# Patient Record
Sex: Female | Born: 1965 | Race: Black or African American | Hispanic: No | Marital: Single | Smoking: Current every day smoker
Health system: Southern US, Community
[De-identification: ages and names within clinical notes are randomized; demographics above are authoritative.]

## PROBLEM LIST (undated history)

## (undated) DIAGNOSIS — I10 Essential (primary) hypertension: Secondary | ICD-10-CM

## (undated) DIAGNOSIS — M542 Cervicalgia: Secondary | ICD-10-CM

## (undated) DIAGNOSIS — Z87448 Personal history of other diseases of urinary system: Secondary | ICD-10-CM

## (undated) DIAGNOSIS — R7301 Impaired fasting glucose: Secondary | ICD-10-CM

## (undated) DIAGNOSIS — Z8669 Personal history of other diseases of the nervous system and sense organs: Secondary | ICD-10-CM

## (undated) DIAGNOSIS — F17200 Nicotine dependence, unspecified, uncomplicated: Secondary | ICD-10-CM

## (undated) DIAGNOSIS — J40 Bronchitis, not specified as acute or chronic: Secondary | ICD-10-CM

## (undated) DIAGNOSIS — M549 Dorsalgia, unspecified: Secondary | ICD-10-CM

## (undated) DIAGNOSIS — E119 Type 2 diabetes mellitus without complications: Secondary | ICD-10-CM

## (undated) HISTORY — DX: Type 2 diabetes mellitus without complications: E11.9

## (undated) HISTORY — DX: Personal history of other diseases of urinary system: Z87.448

## (undated) HISTORY — DX: Dorsalgia, unspecified: M54.9

## (undated) HISTORY — DX: Impaired fasting glucose: R73.01

## (undated) HISTORY — DX: Personal history of other diseases of the nervous system and sense organs: Z86.69

## (undated) HISTORY — PX: CHOLECYSTECTOMY: SHX55

## (undated) HISTORY — PX: TUBAL LIGATION: SHX77

---

## 2000-04-15 ENCOUNTER — Emergency Department (HOSPITAL_COMMUNITY): Admission: EM | Admit: 2000-04-15 | Discharge: 2000-04-15 | Payer: Self-pay | Admitting: Emergency Medicine

## 2000-07-27 ENCOUNTER — Encounter: Payer: Self-pay | Admitting: Emergency Medicine

## 2000-07-27 ENCOUNTER — Emergency Department (HOSPITAL_COMMUNITY): Admission: EM | Admit: 2000-07-27 | Discharge: 2000-07-28 | Payer: Self-pay | Admitting: Emergency Medicine

## 2000-08-02 ENCOUNTER — Emergency Department (HOSPITAL_COMMUNITY): Admission: EM | Admit: 2000-08-02 | Discharge: 2000-08-02 | Payer: Self-pay | Admitting: Emergency Medicine

## 2000-08-18 HISTORY — PX: ABDOMINAL HYSTERECTOMY: SHX81

## 2001-05-26 ENCOUNTER — Other Ambulatory Visit: Admission: RE | Admit: 2001-05-26 | Discharge: 2001-05-26 | Payer: Self-pay | Admitting: *Deleted

## 2001-07-13 ENCOUNTER — Inpatient Hospital Stay (HOSPITAL_COMMUNITY): Admission: RE | Admit: 2001-07-13 | Discharge: 2001-07-15 | Payer: Self-pay | Admitting: *Deleted

## 2001-07-13 ENCOUNTER — Encounter (INDEPENDENT_AMBULATORY_CARE_PROVIDER_SITE_OTHER): Payer: Self-pay | Admitting: Specialist

## 2001-11-04 ENCOUNTER — Encounter: Payer: Self-pay | Admitting: Emergency Medicine

## 2001-11-04 ENCOUNTER — Emergency Department (HOSPITAL_COMMUNITY): Admission: EM | Admit: 2001-11-04 | Discharge: 2001-11-04 | Payer: Self-pay | Admitting: Emergency Medicine

## 2002-04-03 ENCOUNTER — Emergency Department (HOSPITAL_COMMUNITY): Admission: EM | Admit: 2002-04-03 | Discharge: 2002-04-04 | Payer: Self-pay | Admitting: Emergency Medicine

## 2002-04-04 ENCOUNTER — Encounter: Payer: Self-pay | Admitting: Emergency Medicine

## 2002-06-24 ENCOUNTER — Other Ambulatory Visit: Admission: RE | Admit: 2002-06-24 | Discharge: 2002-06-24 | Payer: Self-pay | Admitting: *Deleted

## 2003-01-25 ENCOUNTER — Encounter: Payer: Self-pay | Admitting: Emergency Medicine

## 2003-01-25 ENCOUNTER — Emergency Department (HOSPITAL_COMMUNITY): Admission: EM | Admit: 2003-01-25 | Discharge: 2003-01-25 | Payer: Self-pay | Admitting: Emergency Medicine

## 2003-08-02 ENCOUNTER — Emergency Department (HOSPITAL_COMMUNITY): Admission: EM | Admit: 2003-08-02 | Discharge: 2003-08-02 | Payer: Self-pay | Admitting: Emergency Medicine

## 2003-09-15 ENCOUNTER — Other Ambulatory Visit: Admission: RE | Admit: 2003-09-15 | Discharge: 2003-09-15 | Payer: Self-pay | Admitting: *Deleted

## 2003-10-04 ENCOUNTER — Emergency Department (HOSPITAL_COMMUNITY): Admission: EM | Admit: 2003-10-04 | Discharge: 2003-10-04 | Payer: Self-pay | Admitting: Family Medicine

## 2004-04-16 ENCOUNTER — Emergency Department (HOSPITAL_COMMUNITY): Admission: EM | Admit: 2004-04-16 | Discharge: 2004-04-16 | Payer: Self-pay | Admitting: Family Medicine

## 2004-05-22 ENCOUNTER — Emergency Department (HOSPITAL_COMMUNITY): Admission: EM | Admit: 2004-05-22 | Discharge: 2004-05-22 | Payer: Self-pay | Admitting: Emergency Medicine

## 2004-09-01 ENCOUNTER — Emergency Department (HOSPITAL_COMMUNITY): Admission: EM | Admit: 2004-09-01 | Discharge: 2004-09-01 | Payer: Self-pay | Admitting: Emergency Medicine

## 2004-11-10 ENCOUNTER — Emergency Department (HOSPITAL_COMMUNITY): Admission: EM | Admit: 2004-11-10 | Discharge: 2004-11-10 | Payer: Self-pay | Admitting: Emergency Medicine

## 2005-02-10 ENCOUNTER — Emergency Department (HOSPITAL_COMMUNITY): Admission: EM | Admit: 2005-02-10 | Discharge: 2005-02-10 | Payer: Self-pay | Admitting: Emergency Medicine

## 2005-02-11 ENCOUNTER — Encounter: Admission: RE | Admit: 2005-02-11 | Discharge: 2005-02-11 | Payer: Self-pay | Admitting: Family Medicine

## 2005-06-26 ENCOUNTER — Encounter: Admission: RE | Admit: 2005-06-26 | Discharge: 2005-06-26 | Payer: Self-pay | Admitting: Family Medicine

## 2005-08-02 ENCOUNTER — Emergency Department (HOSPITAL_COMMUNITY): Admission: EM | Admit: 2005-08-02 | Discharge: 2005-08-02 | Payer: Self-pay | Admitting: Emergency Medicine

## 2005-08-20 ENCOUNTER — Other Ambulatory Visit: Admission: RE | Admit: 2005-08-20 | Discharge: 2005-08-20 | Payer: Self-pay | Admitting: Obstetrics and Gynecology

## 2005-09-22 ENCOUNTER — Encounter (INDEPENDENT_AMBULATORY_CARE_PROVIDER_SITE_OTHER): Payer: Self-pay | Admitting: Specialist

## 2005-09-22 ENCOUNTER — Ambulatory Visit (HOSPITAL_COMMUNITY): Admission: RE | Admit: 2005-09-22 | Discharge: 2005-09-22 | Payer: Self-pay | Admitting: Obstetrics and Gynecology

## 2006-07-01 ENCOUNTER — Emergency Department (HOSPITAL_COMMUNITY): Admission: EM | Admit: 2006-07-01 | Discharge: 2006-07-01 | Payer: Self-pay | Admitting: Emergency Medicine

## 2006-07-24 ENCOUNTER — Ambulatory Visit: Payer: Self-pay | Admitting: Family Medicine

## 2006-08-27 ENCOUNTER — Emergency Department (HOSPITAL_COMMUNITY): Admission: EM | Admit: 2006-08-27 | Discharge: 2006-08-27 | Payer: Self-pay | Admitting: Emergency Medicine

## 2006-08-28 ENCOUNTER — Ambulatory Visit: Payer: Self-pay | Admitting: Family Medicine

## 2007-04-05 ENCOUNTER — Emergency Department (HOSPITAL_COMMUNITY): Admission: EM | Admit: 2007-04-05 | Discharge: 2007-04-05 | Payer: Self-pay | Admitting: Emergency Medicine

## 2007-08-23 ENCOUNTER — Emergency Department (HOSPITAL_COMMUNITY): Admission: EM | Admit: 2007-08-23 | Discharge: 2007-08-23 | Payer: Self-pay | Admitting: Emergency Medicine

## 2007-08-25 ENCOUNTER — Ambulatory Visit: Payer: Self-pay | Admitting: Family Medicine

## 2008-01-21 ENCOUNTER — Emergency Department (HOSPITAL_COMMUNITY): Admission: EM | Admit: 2008-01-21 | Discharge: 2008-01-22 | Payer: Self-pay | Admitting: Emergency Medicine

## 2008-01-26 ENCOUNTER — Ambulatory Visit: Payer: Self-pay | Admitting: Family Medicine

## 2008-03-13 ENCOUNTER — Ambulatory Visit: Payer: Self-pay | Admitting: Family Medicine

## 2008-03-15 ENCOUNTER — Encounter: Admission: RE | Admit: 2008-03-15 | Discharge: 2008-03-15 | Payer: Self-pay | Admitting: Family Medicine

## 2008-04-05 ENCOUNTER — Emergency Department (HOSPITAL_COMMUNITY): Admission: EM | Admit: 2008-04-05 | Discharge: 2008-04-05 | Payer: Self-pay | Admitting: Emergency Medicine

## 2008-04-07 ENCOUNTER — Ambulatory Visit: Payer: Self-pay | Admitting: Family Medicine

## 2008-09-08 ENCOUNTER — Ambulatory Visit: Payer: Self-pay | Admitting: Family Medicine

## 2008-10-02 ENCOUNTER — Ambulatory Visit: Payer: Self-pay | Admitting: Family Medicine

## 2008-10-02 ENCOUNTER — Other Ambulatory Visit: Admission: RE | Admit: 2008-10-02 | Discharge: 2008-10-02 | Payer: Self-pay | Admitting: Family Medicine

## 2008-11-05 ENCOUNTER — Emergency Department (HOSPITAL_COMMUNITY): Admission: EM | Admit: 2008-11-05 | Discharge: 2008-11-05 | Payer: Self-pay | Admitting: Emergency Medicine

## 2009-02-04 ENCOUNTER — Emergency Department (HOSPITAL_COMMUNITY): Admission: EM | Admit: 2009-02-04 | Discharge: 2009-02-04 | Payer: Self-pay | Admitting: Emergency Medicine

## 2009-02-09 ENCOUNTER — Ambulatory Visit: Payer: Self-pay | Admitting: Family Medicine

## 2009-05-14 ENCOUNTER — Encounter: Admission: RE | Admit: 2009-05-14 | Discharge: 2009-05-14 | Payer: Self-pay | Admitting: Family Medicine

## 2009-05-14 ENCOUNTER — Ambulatory Visit: Payer: Self-pay | Admitting: Family Medicine

## 2009-05-29 ENCOUNTER — Ambulatory Visit: Payer: Self-pay | Admitting: Family Medicine

## 2009-06-13 ENCOUNTER — Ambulatory Visit: Payer: Self-pay | Admitting: Family Medicine

## 2009-06-15 ENCOUNTER — Encounter: Admission: RE | Admit: 2009-06-15 | Discharge: 2009-06-15 | Payer: Self-pay | Admitting: Chiropractic Medicine

## 2009-08-21 ENCOUNTER — Ambulatory Visit: Payer: Self-pay | Admitting: Family Medicine

## 2009-09-04 ENCOUNTER — Ambulatory Visit: Payer: Self-pay | Admitting: Family Medicine

## 2010-01-04 ENCOUNTER — Ambulatory Visit (HOSPITAL_COMMUNITY): Admission: RE | Admit: 2010-01-04 | Discharge: 2010-01-04 | Payer: Self-pay | Admitting: Family Medicine

## 2010-04-08 ENCOUNTER — Ambulatory Visit: Payer: Self-pay | Admitting: Family Medicine

## 2010-04-09 ENCOUNTER — Ambulatory Visit: Payer: Self-pay | Admitting: Physician Assistant

## 2010-04-09 ENCOUNTER — Encounter: Admission: RE | Admit: 2010-04-09 | Discharge: 2010-04-09 | Payer: Self-pay | Admitting: Family Medicine

## 2010-06-18 ENCOUNTER — Ambulatory Visit: Payer: Self-pay | Admitting: Family Medicine

## 2010-06-19 ENCOUNTER — Ambulatory Visit: Payer: Self-pay | Admitting: Family Medicine

## 2010-09-27 ENCOUNTER — Ambulatory Visit (INDEPENDENT_AMBULATORY_CARE_PROVIDER_SITE_OTHER): Payer: BC Managed Care – PPO | Admitting: Family Medicine

## 2010-09-27 DIAGNOSIS — J029 Acute pharyngitis, unspecified: Secondary | ICD-10-CM

## 2010-09-27 DIAGNOSIS — H6690 Otitis media, unspecified, unspecified ear: Secondary | ICD-10-CM

## 2010-11-25 LAB — RAPID STREP SCREEN (MED CTR MEBANE ONLY): Streptococcus, Group A Screen (Direct): NEGATIVE

## 2011-01-03 NOTE — H&P (Signed)
Carol [name], TERRITO NO.:  [id number]   MEDICAL RECORD NO.:  [id number]          PATIENT TYPE:  AMB   LOCATION:  SDC                           FACILITY:  [facility]   PHYSICIAN:  Juluis Mire, M.D.   DATE OF BIRTH:  September 15, 1965   DATE OF ADMISSION:  09/22/2005  DATE OF DISCHARGE:                                HISTORY & PHYSICAL   The patient is a 45 year old gravida 3, para 2, aborta 1, black female  presents for diagnostic laparoscopy.   The patient had a previous total abdominal hysterectomy in 2002 for  management of uterine fibroids.  She has had persistent pelvic pain,  discomfort since that time.  She has had prior ultrasounds that have been  basically unremarkable.  She has had an increasing pain with intercourse,  particularly during deep penetration.  That pain tends to be concentrated  more on the right side.  Presumptively, we may be dealing with some pelvic  adhesions.  She now presents for laparoscopic evaluation.   In terms of allergies, she has no known drug allergies.   MEDICATIONS:  None.   PAST MEDICAL HISTORY:  Usual childhood diseases, no significant sequelae.   PAST OPERATIONS:  She has had a previous tubal.  She has had a D&E and she  has had two vaginal deliveries at term without difficulties.   REVIEW OF SYSTEMS:  Noncontributory.   SOCIAL HISTORY:  Smokes half a pack per day.  Occasional alcohol use.   FAMILY HISTORY:  Noncontributory.   PHYSICAL EXAMINATION:  VITAL SIGNS:  The patient is afebrile with stable  vital signs.  HEENT:  The patient is normocephalic.  Pupils are equal, round and react to  light and accommodation.  Extraocular movements were intact.  Sclerae and  conjunctivae are clear.  Oropharynx clear.  NECK:  Without thyromegaly.  BREASTS:  No discrete masses.  LUNGS:  Clear.  CARDIOVASCULAR:  Regular rhythm and rate without murmurs or gallops.  ABDOMEN:  Exam is benign.  No masses, organomegaly or tenderness.  PELVIC:  Normal external genitalia.  Vaginal mucosa is clear.  Cuff intact.  Does have cuff tenderness noted on exam.  No masses appreciated.  EXTREMITIES:  Trace edema.  NEUROLOGIC:  Exam was grossly within normal limits.   IMPRESSION:  1.  Chronic pelvic pain.  2.  Dyspareunia.  3.  Rule out pelvic adhesions.   PLAN:  The patient will undergo diagnostic laparoscopy with laser standby.  The risks of surgery have been discussed including the risk of infection,  the risk of hemorrhage that could require transfusion, the risk of AIDS or  hepatitis, the risk of injury to adjacent organs, requiring exploratory  surgery.  The risk of deep vein thrombosis and pulmonary embolus.  The  patient voices understanding of the indications and risks.      Juluis Mire, M.D.  Electronically Signed     JSM/MEDQ  D:  09/22/2005  T:  09/22/2005  Job:  [id number]

## 2011-01-03 NOTE — Op Note (Signed)
Carol, [name] NO.:  [id number]   MEDICAL RECORD NO.:  [id number]          PATIENT TYPE:  AMB   LOCATION:  SDC                           FACILITY:  [facility]   PHYSICIAN:  Juluis Mire, M.D.   DATE OF BIRTH:  05-Aug-1966   DATE OF PROCEDURE:  09/22/2005  DATE OF DISCHARGE:                                 OPERATIVE REPORT   PREOPERATIVE DIAGNOSIS:  Chronic pelvic pain.   POSTOPERATIVE DIAGNOSES:  1.  Pelvic adhesions.  2.  Functional left ovarian cyst.  3.  Endometriosis.   OPERATIVE PROCEDURES:  1.  Open laparoscopy.  2.  Lysis of adhesions.  3.  Cautery of endometriotic implants.   SURGEON:  Juluis Mire, M.D.   ANESTHESIA:  General endotracheal.   ESTIMATED BLOOD LOSS:  Minimal.   PACKS AND DRAINS:  None.   INTRAOPERATIVE BLOOD REPLACED:  None.   COMPLICATIONS:  None.   INDICATIONS:  Dictated in the history and physical.   PROCEDURE:  The patient was taken to the OR and placed in the supine  position.  After a satisfactory level of general anesthesia obtained, the  patient was placed in the dorsal lithotomy position using the Allen  stirrups.  The abdomen, perineum and vagina were prepped out with Betadine.  The bladder was emptied by in-and-out catheterization.  A sponge on a sponge  stick was placed in the vaginal vault.  The patient was draped in a sterile  field.  A subumbilical incision was made with a knife.  The incision was  extended to the fascia.  The fascia was extended sharply and the incision in  the fascia extended laterally.  The peritoneum was entered bluntly.  There  was no evidence of adhesions of the periumbilical area.  A Taut open  laparoscopic trocar was put in place and secured, the abdomen was inflated  of carbon dioxide and the laparoscope was introduced.  No evidence of injury  to adjacent organs.  The 5 mm trocar was put in place under direct  visualization.  The uterus and cervix were surgically absent.  The  right  ovary was high on the right pelvic sidewall and unremarkable.  The left  ovary had what appeared to be a functional ovarian cyst and had a few flimsy  adhesions to the left sigmoid colon.  The appendix was not seen.  It may  have been retrocecal or been previously removed.  Her upper abdomen was  unremarkable.  First the adhesions were taken down using sharp dissection.  We then did a left ovarian cystotomy and removed the cyst wall, and this was  sent for pathologic review.  Fluid was clear.  The inside of the cyst was  unremarkable.  It was felt to be functional in origin.  We used the bipolar  to bring about hemostasis.  We then thoroughly irrigated the pelvis, all  irrigation was removed, and there was no active bleeding or signs of  persistent adhesions.  She did have two implants of endometriosis at the  vaginal cuff, treated with cautery.  At  the end of the procedure we had  excellent hemostasis.  The abdomen was deflated of its carbon dioxide, all  trocars removed.  The subumbilical fascia was closed with two figure-of-  eights of 0 Vicryl, the skin with interrupted subcuticular of 4-0 Vicryl.  The suprapubic incision was closed with Dermabond.  The sponge on a sponge  stick was removed from the vaginal vault, the patient taken out of the  dorsal lithotomy position and once alert and extubated, transferred to the  recovery room in good condition.  Sponge, instrument and needle count was  reported as correct by the circulating nurse x2.      Juluis Mire, M.D.  Electronically Signed     JSM/MEDQ  D:  09/22/2005  T:  09/22/2005  Job:  [id number]

## 2011-01-03 NOTE — Discharge Summary (Signed)
[facility]  Patient:    TAISA, DELORIA Visit Number: [id number] MRN: [id number]          Service Type: GYN Location: 9300 [id number] Attending Physician:  Rhina Brackett Dictated by:   Duke Salvia. Marcelle Overlie, M.D. Admit Date:  07/13/2001 Discharge Date: 07/15/2001                             Discharge Summary  DISCHARGE DIAGNOSES:          1. Symptomatic leiomyoma with menorrhagia.                               2. Total abdominal hysterectomy this admission.  HISTORY OF PRESENT ILLNESS:   For summary of the history and physical exam, please see the admission H&P for details.  Briefly, a 45 year old, G3, P2, prior tubal ligation with symptomatic leiomyoma and menorrhagia presents for TAH.  HOSPITAL COURSE:              On July 13, 2001 under general anesthesia, the patient underwent TAH without complication.  On the first postoperative day, hemoglobin was 8.8.  Her diet was advanced. The IV was discontinued and she remained afebrile.  By the a.m. of July 15, 2001, she was tolerating a regular diet.  The incision was clean and dry.  Hemoglobin on postoperative day two was 7.9. Again, she remained afebrile and was ready for discharge at that point.  LABORATORY DATA:              Preoperative hemoglobin was 12.4.  On July 14, 2001, which was postoperative day one, it was 8.8.  On July 15, 2001, postoperative day two 7.9.  Admission urinalysis was unremarkable.  Blood type was A positive.  DISPOSITION:                  The patient was discharged home Hemocyte once daily or come to the office on Monday to have the clips removed.  Advised her to report any incisional redness or drainage, increased pain or bleeding, or fever over 101.  She was given specific instructions regarding diet, sex, exercise.  Tylox p.r.n. pain.  CONDITION ON DISCHARGE:       Good.  ACTIVITY:                     Graded increase. Dictated by:   Duke Salvia. Marcelle Overlie, M.D. Attending Physician:  Rhina Brackett DD:  07/15/01 TD:  07/15/01 Job: [id number]

## 2011-01-03 NOTE — Op Note (Signed)
[facility]  Patient:    Carol [name], Carol [name] Visit Number: [id number] MRN: [id number]          Service Type: GYN Location: 9300 [id number] Attending Physician:  Rhina Brackett Dictated by:   Duke Salvia. Marcelle Overlie, M.D. Proc. Date: 07/13/01 Admit Date:  07/13/2001                             Operative Report  PREOPERATIVE DIAGNOSIS:       Menorrhagia, leiomyoma.  POSTOPERATIVE DIAGNOSIS:      Menorrhagia, leiomyoma.  OPERATION:                    TAH.  SURGEON:                      Duke Salvia. Marcelle Overlie, M.D.  ASSISTANT:                    Raynald Kemp, M.D.  ANESTHESIA:                   General endotracheal.  COMPLICATIONS:                None.  DRAINS:                       Foley catheter.  ESTIMATED BLOOD LOSS:         100.  DESCRIPTION OF PROCEDURE AND FINDINGS:                 The patient was brought to the operating room and after an adequate level of general endotracheal anesthesia was obtained with the patient supine, the abdomen was prepped and draped in the usual manner for sterile abdominal procedure.  The vagina was prepped separately with Betadine and a Foley catheter was inserted.  A transverse incision was made two fingerbreadths above the symphysis and carried down to the fascia which was incised and extended transversely.  The rectus muscles were divided in the midline.  The peritoneum was entered superiorly without incident.  The upper abdomen was explored and noted to be unremarkable.  An OConnor-OSullivan retractor was position.  The patient was placed in Trendelenburg and the bowel packed superiorly out of the field.  Pelvic findings revealed the uterus was eight weeks size with irregular shape consistent with fibroids.  The adnexa are unremarkable.  The cul-de-sac was free and clear.  The cecum and appendix were otherwise unremarkable.  The utero-ovarian pedicles were grasped with long Kelly clamps starting on the left.   The round ligament was clamped, divided, and suture ligated with 0 Dexon.  The utero-ovarian pedicle on that side was divided, first free tied followed by a suture ligature of Dexon.  The peritoneum was carried around to the midline.  The exact same repeated on the opposite side.  After this was completed, the bladder was bluntly and sharply dissected below the cervicovaginal junction.  The uterine vascular pedicles were then skeletonized, clamped, divided, and suture ligated with 0 Dexon.  In a sequential manner, staying close to the uterus and ensuring that the bladder was well below.  The cardinal ligaments, uterosacral ligaments, and cervicovaginal pedicles were clamped, divided, and suture ligated with 0 Dexon suture, and the specimen was excised.  The cuff closed with a running locked 2-0 Dexon suture.  The pelvis was then irrigated with saline and noted  to be hemostatic.  The ovaries were suspended to the round ligament on either side. Reinspection revealed the operative site to be hemostatic.  Prior to closure, sponge, needle, and instrument counts were reported as correct x 2.  The rectus muscle was reapproximated in the midline with 2-0 Dexon interrupted sutures.  The fascia was closed from laterally to midline on either side with a 0 Dexon running suture.  The subcutaneous fat was hemostatic.  Clips and Steri-Strips were used on the skin.  She tolerated this well and went to the recovery room in good condition. Dictated by:   Duke Salvia. Marcelle Overlie, M.D. Attending Physician:  Rhina Brackett DD:  07/13/01 TD:  07/13/01 Job: [id number]

## 2011-01-03 NOTE — H&P (Signed)
[facility]  Patient:    Carol [name], Carol [name] Visit Number: [id number] MRN: [id number]          Service Type: Attending:  Duke Salvia. Marcelle Overlie, M.D. Dictated by:   Duke Salvia. Marcelle Overlie, M.D. Adm. Date:  07/13/01                           History and Physical  CHIEF COMPLAINT:              Pelvic pain, menorrhagia, leiomyoma.  HISTORY OF PRESENT ILLNESS:   A 46 year old G3 P2, previous tubal ligation. She was evaluated by Dr. Malachy Mood October 2002 on referral from Dr. Susann Givens for menometrorrhagia with pelvic pain.  Ultrasound per Dr. Susann Givens showed multiple uterine fibroids - 1.8 cm and another that was 1.5 cm.  Dr. Malachy Mood had discussed with her possible myomectomy but in his absence she had requested that I had performed her surgery.  In meeting with her preoperatively we discussed myomectomy at length.  We also reviewed hysterectomy at length.  The day that the myomectomy was scheduled, she did not want to change and thus we were unable to perform LAVH because the instrumentation was not available. After discussing this fact, she prefers to proceed with TAH, possible BSO. This was reviewed at length.  The procedure, risks relative to bleeding, infection, transfusion, adjacent organ injury all discussed.  Possibility of USO or BSO reviewed.  Other risks regarding wound infection, phlebitis, and her expected recovery time all reviewed, which she understands.  PAST MEDICAL HISTORY:  ALLERGIES:                    None.  OPERATIONS:                   Prior tubal and D&E.  OBSTETRICAL HISTORY:          Two vaginal deliveries at term without complication.  REVIEW OF SYSTEMS:            Otherwise negative.  SOCIAL HISTORY:               She smokes one-half PPD, occasional alcohol use.  PHYSICAL EXAMINATION:  VITAL SIGNS:                  Temperature 98.2, blood pressure 110/64.  HEENT:                        Unremarkable.  NECK:                          Supple without masses.  LUNGS:                        Clear.  CARDIOVASCULAR:               Regular rate and rhythm without murmurs, rubs, or gallops noted.  BREASTS:                      Without masses.  ABDOMEN:                      Soft, flat, nontender.  PELVIC:                       Normal external genitalia, vagina and cervix clear.  Uterus felt to be  8 week size.  Adnexa unremarkable.  IMPRESSION:                   Symptomatic leiomyoma, menometrorrhagia, with chronic pelvic pain and dysmenorrhea.  PLAN:                         TAH, possible BSO.  Procedure and risks discussed as above. Dictated by:   Duke Salvia. Marcelle Overlie, M.D. Attending:  Duke Salvia. Marcelle Overlie, M.D. DD:  07/12/01 TD:  07/12/01 Job: [id number]

## 2011-02-07 ENCOUNTER — Ambulatory Visit: Payer: BC Managed Care – PPO | Admitting: Medical

## 2011-05-07 LAB — URINALYSIS, ROUTINE W REFLEX MICROSCOPIC
Glucose, UA: NEGATIVE
Protein, ur: NEGATIVE
Specific Gravity, Urine: 1.018
pH: 5

## 2011-05-07 LAB — CBC
HCT: 37.4
Hemoglobin: 13
RBC: 4.17

## 2011-05-07 LAB — RPR: RPR Ser Ql: NONREACTIVE

## 2011-05-07 LAB — URINE MICROSCOPIC-ADD ON

## 2011-05-07 LAB — BASIC METABOLIC PANEL
CO2: 26
Calcium: 9.6
GFR calc Af Amer: >60
GFR calc non Af Amer: >60
Potassium: 3.4 — ABNORMAL LOW
Sodium: 141

## 2011-05-07 LAB — URINE CULTURE: Colony Count: 30000

## 2011-05-07 LAB — WET PREP, GENITAL: Trich, Wet Prep: NONE SEEN

## 2011-05-07 LAB — GC/CHLAMYDIA PROBE AMP, GENITAL: Chlamydia, DNA Probe: NEGATIVE

## 2011-05-08 ENCOUNTER — Encounter: Payer: Self-pay | Admitting: Family Medicine

## 2011-05-08 ENCOUNTER — Ambulatory Visit (INDEPENDENT_AMBULATORY_CARE_PROVIDER_SITE_OTHER): Payer: BC Managed Care – PPO | Admitting: Family Medicine

## 2011-05-08 VITALS — BP 140/90 | Temp 98.3°F | Wt 164.0 lb

## 2011-05-08 DIAGNOSIS — J029 Acute pharyngitis, unspecified: Secondary | ICD-10-CM

## 2011-05-08 NOTE — Progress Notes (Signed)
  Subjective:    Patient ID: Carol [name], female    DOB: 04/23/1966, 45 y.o.   MRN: [id number]  HPI She complains of a one-day history of slight sore throat, myalgias, some nausea, chills with tinnitus. No earache or congestion.   Review of Systems     Objective:   Physical Exam alert and in no distress. Tympanic membranes and canals are normal. Throat is clear. Tonsils are normal. Neck is supple without adenopathy or thyromegaly. Cardiac exam shows a regular sinus rhythm without murmurs or gallops. Lungs are clear to auscultation. Strep screen negative       Assessment & Plan:  Viral URI/pharyngitis Supportive care. Call if further difficulties.

## 2011-05-09 ENCOUNTER — Emergency Department (HOSPITAL_COMMUNITY)
Admission: EM | Admit: 2011-05-09 | Discharge: 2011-05-09 | Disposition: A | Discharge status: Home / Self Care | Payer: BC Managed Care – PPO | Attending: Emergency Medicine | Admitting: Emergency Medicine

## 2011-05-09 DIAGNOSIS — R05 Cough: Secondary | ICD-10-CM | POA: Insufficient documentation

## 2011-05-09 DIAGNOSIS — R5383 Other fatigue: Secondary | ICD-10-CM | POA: Insufficient documentation

## 2011-05-09 DIAGNOSIS — I10 Essential (primary) hypertension: Secondary | ICD-10-CM | POA: Insufficient documentation

## 2011-05-09 DIAGNOSIS — R5381 Other malaise: Secondary | ICD-10-CM | POA: Insufficient documentation

## 2011-05-09 DIAGNOSIS — J069 Acute upper respiratory infection, unspecified: Secondary | ICD-10-CM | POA: Insufficient documentation

## 2011-05-09 DIAGNOSIS — R059 Cough, unspecified: Secondary | ICD-10-CM | POA: Insufficient documentation

## 2011-05-12 ENCOUNTER — Emergency Department (HOSPITAL_COMMUNITY): Payer: BC Managed Care – PPO

## 2011-05-12 ENCOUNTER — Emergency Department (HOSPITAL_COMMUNITY)
Admission: EM | Admit: 2011-05-12 | Discharge: 2011-05-13 | Disposition: A | Discharge status: Home / Self Care | Payer: BC Managed Care – PPO | Attending: Emergency Medicine | Admitting: Emergency Medicine

## 2011-05-12 DIAGNOSIS — J069 Acute upper respiratory infection, unspecified: Secondary | ICD-10-CM | POA: Insufficient documentation

## 2011-05-12 DIAGNOSIS — R079 Chest pain, unspecified: Secondary | ICD-10-CM | POA: Insufficient documentation

## 2011-05-12 DIAGNOSIS — R0682 Tachypnea, not elsewhere classified: Secondary | ICD-10-CM | POA: Insufficient documentation

## 2011-05-12 DIAGNOSIS — R509 Fever, unspecified: Secondary | ICD-10-CM | POA: Insufficient documentation

## 2011-05-12 DIAGNOSIS — I10 Essential (primary) hypertension: Secondary | ICD-10-CM | POA: Insufficient documentation

## 2011-05-12 DIAGNOSIS — IMO0001 Reserved for inherently not codable concepts without codable children: Secondary | ICD-10-CM | POA: Insufficient documentation

## 2011-05-14 ENCOUNTER — Encounter: Payer: Self-pay | Admitting: Family Medicine

## 2011-05-19 ENCOUNTER — Ambulatory Visit: Payer: BC Managed Care – PPO | Admitting: Family Medicine

## 2011-05-30 LAB — URINALYSIS, ROUTINE W REFLEX MICROSCOPIC
Bilirubin Urine: NEGATIVE
Ketones, ur: NEGATIVE
Nitrite: NEGATIVE
Protein, ur: NEGATIVE
pH: 6

## 2011-05-30 LAB — HEPATIC FUNCTION PANEL
ALT: 19
AST: 18
Albumin: 3.7
Alkaline Phosphatase: 61
Bilirubin, Direct: 0.1
Indirect Bilirubin: 0.4
Total Bilirubin: 0.5
Total Protein: 7

## 2011-05-30 LAB — LIPASE, BLOOD: Lipase: 33

## 2011-05-30 LAB — DIFFERENTIAL
Basophils Absolute: 0
Basophils Relative: 0
Eosinophils Absolute: 0.2
Eosinophils Relative: 3
Lymphocytes Relative: 31
Monocytes Absolute: 0.3

## 2011-05-30 LAB — URINE MICROSCOPIC-ADD ON

## 2011-05-30 LAB — CBC
HCT: 36.7
Hemoglobin: 12.6
MCHC: 34.4
MCV: 89.3
RDW: 12.6

## 2011-05-30 LAB — BASIC METABOLIC PANEL
BUN: 9
CO2: 25
Chloride: 108
Glucose, Bld: 96
Potassium: 3.4 — ABNORMAL LOW
Sodium: 140

## 2011-11-03 ENCOUNTER — Emergency Department (HOSPITAL_COMMUNITY)
Admission: EM | Admit: 2011-11-03 | Discharge: 2011-11-03 | Disposition: A | Discharge status: Home / Self Care | Payer: BC Managed Care – PPO | Attending: Emergency Medicine | Admitting: Emergency Medicine

## 2011-11-03 ENCOUNTER — Encounter (HOSPITAL_COMMUNITY): Payer: Self-pay | Admitting: *Deleted

## 2011-11-03 ENCOUNTER — Emergency Department (HOSPITAL_COMMUNITY): Payer: BC Managed Care – PPO

## 2011-11-03 DIAGNOSIS — N760 Acute vaginitis: Secondary | ICD-10-CM | POA: Insufficient documentation

## 2011-11-03 DIAGNOSIS — Z90710 Acquired absence of both cervix and uterus: Secondary | ICD-10-CM | POA: Insufficient documentation

## 2011-11-03 DIAGNOSIS — R300 Dysuria: Secondary | ICD-10-CM | POA: Insufficient documentation

## 2011-11-03 DIAGNOSIS — A499 Bacterial infection, unspecified: Secondary | ICD-10-CM | POA: Insufficient documentation

## 2011-11-03 DIAGNOSIS — R1031 Right lower quadrant pain: Secondary | ICD-10-CM | POA: Insufficient documentation

## 2011-11-03 DIAGNOSIS — B9689 Other specified bacterial agents as the cause of diseases classified elsewhere: Secondary | ICD-10-CM | POA: Insufficient documentation

## 2011-11-03 LAB — URINALYSIS, ROUTINE W REFLEX MICROSCOPIC
Glucose, UA: NEGATIVE mg/dL
Leukocytes, UA: NEGATIVE
pH: 5.5 (ref 5.0–8.0)

## 2011-11-03 LAB — DIFFERENTIAL
Eosinophils Relative: 1 % (ref 0–5)
Lymphocytes Relative: 36 % (ref 12–46)
Lymphs Abs: 3.1 10*3/uL (ref 0.7–4.0)
Monocytes Absolute: 0.7 10*3/uL (ref 0.1–1.0)
Monocytes Relative: 8 % (ref 3–12)

## 2011-11-03 LAB — CBC
HCT: 35.2 % — ABNORMAL LOW (ref 36.0–46.0)
Hemoglobin: 12.5 g/dL (ref 12.0–15.0)
MCV: 83.4 fL (ref 78.0–100.0)
Platelets: 387 10*3/uL (ref 150–400)
RBC: 4.22 MIL/uL (ref 3.87–5.11)
WBC: 8.6 10*3/uL (ref 4.0–10.5)

## 2011-11-03 LAB — POCT I-STAT, CHEM 8
Calcium, Ion: 1.17 mmol/L (ref 1.12–1.32)
HCT: 37 % (ref 36.0–46.0)
TCO2: 25 mmol/L (ref 0–100)

## 2011-11-03 LAB — URINE MICROSCOPIC-ADD ON

## 2011-11-03 LAB — WET PREP, GENITAL: Yeast Wet Prep HPF POC: NONE SEEN

## 2011-11-03 MED ORDER — MORPHINE SULFATE 4 MG/ML IJ SOLN
4.0000 mg | Freq: Once | INTRAMUSCULAR | Status: AC
  Administered 2011-11-03: 4 mg via INTRAVENOUS
  Filled 2011-11-03: qty 1

## 2011-11-03 MED ORDER — HYDROCODONE-ACETAMINOPHEN 5-500 MG PO TABS: 1.0000 | ORAL_TABLET | Freq: Four times a day (QID) | ORAL | Status: DC | PRN

## 2011-11-03 MED ORDER — METRONIDAZOLE 500 MG PO TABS: 500.0000 mg | ORAL_TABLET | Freq: Two times a day (BID) | ORAL | Status: AC

## 2011-11-03 MED ORDER — IOHEXOL 300 MG/ML  SOLN
100.0000 mL | Freq: Once | INTRAMUSCULAR | Status: AC | PRN
  Administered 2011-11-03: 100 mL via INTRAVENOUS

## 2011-11-03 NOTE — Discharge Instructions (Signed)
Bacterial Vaginosis Bacterial vaginosis (BV) is a vaginal infection where the normal balance of bacteria in the vagina is disrupted. The normal balance is then replaced by an overgrowth of certain bacteria. There are several different kinds of bacteria that can cause BV. BV is the most common vaginal infection in women of childbearing age. CAUSES   The cause of BV is not fully understood. BV develops when there is an increase or imbalance of harmful bacteria.   Some activities or behaviors can upset the normal balance of bacteria in the vagina and put women at increased risk including:   Having a new sex partner or multiple sex partners.   Douching.   Using an intrauterine device (IUD) for contraception.   It is not clear what role sexual activity plays in the development of BV. However, women that have never had sexual intercourse are rarely infected with BV.  Women do not get BV from toilet seats, bedding, swimming pools or from touching objects around them.  SYMPTOMS   Grey vaginal discharge.   A fish-like odor with discharge, especially after sexual intercourse.   Itching or burning of the vagina and vulva.   Burning or pain with urination.   Some women have no signs or symptoms at all.  DIAGNOSIS  Your caregiver must examine the vagina for signs of BV. Your caregiver will perform lab tests and look at the sample of vaginal fluid through a microscope. They will look for bacteria and abnormal cells (clue cells), a pH test higher than 4.5, and a positive amine test all associated with BV.  RISKS AND COMPLICATIONS   Pelvic inflammatory disease (PID).   Infections following gynecology surgery.   Developing HIV.   Developing herpes virus.  TREATMENT  Sometimes BV will clear up without treatment. However, all women with symptoms of BV should be treated to avoid complications, especially if gynecology surgery is planned. Female partners generally do not need to be treated. However,  BV may spread between female sex partners so treatment is helpful in preventing a recurrence of BV.   BV may be treated with antibiotics. The antibiotics come in either pill or vaginal cream forms. Either can be used with nonpregnant or pregnant women, but the recommended dosages differ. These antibiotics are not harmful to the baby.   BV can recur after treatment. If this happens, a second round of antibiotics will often be prescribed.   Treatment is important for pregnant women. If not treated, BV can cause a premature delivery, especially for a pregnant woman who had a premature birth in the past. All pregnant women who have symptoms of BV should be checked and treated.   For chronic reoccurrence of BV, treatment with a type of prescribed gel vaginally twice a week is helpful.  HOME CARE INSTRUCTIONS   Finish all medication as directed by your caregiver.   Do not have sex until treatment is completed.   Tell your sexual partner that you have a vaginal infection. They should see their caregiver and be treated if they have problems, such as a mild rash or itching.   Practice safe sex. Use condoms. Only have 1 sex partner.  PREVENTION  Basic prevention steps can help reduce the risk of upsetting the natural balance of bacteria in the vagina and developing BV:  Do not have sexual intercourse (be abstinent).   Do not douche.   Use all of the medicine prescribed for treatment of BV, even if the signs and symptoms go away.     Tell your sex partner if you have BV. That way, they can be treated, if needed, to prevent reoccurrence.  SEEK MEDICAL CARE IF:   Your symptoms are not improving after 3 days of treatment.   You have increased discharge, pain, or fever.  MAKE SURE YOU:   Understand these instructions.   Will watch your condition.   Will get help right away if you are not doing well or get worse.  FOR MORE INFORMATION  Division of STD Prevention (DSTDP), Centers for Disease  Control and Prevention: www.cdc.gov/std American Social Health Association (ASHA): www.ashastd.org  Document Released: 08/04/2005 Document Revised: 07/24/2011 Document Reviewed: 01/25/2009 ExitCare Patient Information 2012 [employer].Abdominal Pain Abdominal pain can be caused by many things. Your caregiver decides the seriousness of your pain by an examination and possibly blood tests and X-rays. Many cases can be observed and treated at home. Most abdominal pain is not caused by a disease and will probably improve without treatment. However, in many cases, more time must pass before a clear cause of the pain can be found. Before that point, it may not be known if you need more testing, or if hospitalization or surgery is needed. HOME CARE INSTRUCTIONS   Do not take laxatives unless directed by your caregiver.   Take pain medicine only as directed by your caregiver.   Only take over-the-counter or prescription medicines for pain, discomfort, or fever as directed by your caregiver.   Try a clear liquid diet (broth, tea, or water) for as long as directed by your caregiver. Slowly move to a bland diet as tolerated.  SEEK IMMEDIATE MEDICAL CARE IF:   The pain does not go away.   You have a fever.   You keep throwing up (vomiting).   The pain is felt only in portions of the abdomen. Pain in the right side could possibly be appendicitis. In an adult, pain in the left lower portion of the abdomen could be colitis or diverticulitis.   You pass bloody or black tarry stools.  MAKE SURE YOU:   Understand these instructions.   Will watch your condition.   Will get help right away if you are not doing well or get worse.  Document Released: 05/14/2005 Document Revised: 07/24/2011 Document Reviewed: 03/22/2008 Exit[employer] Patient Information 2012 [employer]. 

## 2011-11-03 NOTE — ED Notes (Signed)
Pt c/o abdominal pain that began early this morning while at work. Pt describes pain as sharp and constant.  Pt states that last week she was sick with nausea and diarrhea but denies vomiting. Pt also reports increased frequency in urination as well as painful urination.

## 2011-11-03 NOTE — ED Notes (Signed)
To US via w/c

## 2011-11-03 NOTE — ED Provider Notes (Signed)
History     CSN: [id number]  Arrival date & time 11/03/11  0865   First MD Initiated Contact with Patient 11/03/11 0559      Chief Complaint  Patient presents with  . Abdominal Pain  . Dysuria    (Consider location/radiation/quality/duration/timing/severity/associated sxs/prior treatment) HPI  46 year old female with history of tubal ligation presents with abdominal pain. Patient states 2 days ago and she experiencing a gradual onset of right lower quadrant abdominal pain. Describe pain as a sharp sensation, that is waxing and waning, usually lasting for seconds. Pain worsened with palpation. She has not tried anything to relieve the pain. She also noticed some dysuria, and urinary frequency. She denies vaginal discharge. Patient denies fever, nausea, vomiting, diarrhea, constipation, chest pain, shortness of breath. She does experience low back pain. She denies rash. She denies any strenuous exercise, or any recent trauma. She is able to pass flatus. Aside from a tubal ligation procedure she has not had any other abdominal surgery. She states she is not sexually active.  Past Medical History  Diagnosis Date  . History of migraine headaches   . Impaired fasting glucose     Past Surgical History  Procedure Date  . Cholecystectomy   . Abdominal hysterectomy 2002    Family History  Problem Relation Age of Onset  . Arthritis Paternal Grandmother   . Diabetes Paternal Grandmother   . Hypertension Paternal Grandmother   . Stroke Paternal Grandmother     History  Substance Use Topics  . Smoking status: Current Some Day Smoker  . Smokeless tobacco: Never Used  . Alcohol Use: Not on file    OB History    Grav Para Term Preterm Abortions TAB SAB Ect Mult Living                  Review of Systems  All other systems reviewed and are negative.    Allergies  Review of patient's allergies indicates no known allergies.  Home Medications   Current Outpatient Rx  Name  Route Sig Dispense Refill  . TELMISARTAN-HCTZ 40-12.5 MG PO TABS Oral Take 1 tablet by mouth daily.        BP 144/81  Pulse 98  Temp(Src) 98.3 F (36.8 C) (Oral)  Resp 18  SpO2 98%  Physical Exam  Nursing note and vitals reviewed. Constitutional: She appears well-developed and well-nourished. No distress.  HENT:  Head: Normocephalic and atraumatic.  Eyes: Conjunctivae are normal.  Neck: Normal range of motion. Neck supple.  Cardiovascular: Normal rate and regular rhythm.   Pulmonary/Chest: Effort normal and breath sounds normal. She exhibits no tenderness.  Abdominal: Soft. There is tenderness in the right lower quadrant. There is no rigidity, no rebound, no guarding, no CVA tenderness, no tenderness at McBurney's point and negative Murphy's sign. No hernia. Hernia confirmed negative in the ventral area, confirmed negative in the right inguinal area and confirmed negative in the left inguinal area.  Genitourinary: Vagina normal and uterus normal. There is no rash or lesion on the right labia. There is no rash or lesion on the left labia. Cervix exhibits no motion tenderness and no discharge. Right adnexum displays tenderness. Right adnexum displays no mass. Left adnexum displays no mass and no tenderness. No erythema, tenderness or bleeding around the vagina. No vaginal discharge found.  Lymphadenopathy:       Right: No inguinal adenopathy present.       Left: No inguinal adenopathy present.  Skin:  ED Course  Procedures (including critical care time)   Labs Reviewed  URINALYSIS, ROUTINE W REFLEX MICROSCOPIC   No results found.   No diagnosis found.  Results for orders placed during the hospital encounter of 11/03/11  URINALYSIS, ROUTINE W REFLEX MICROSCOPIC      Component Value Range   Color, Urine YELLOW  YELLOW    APPearance CLOUDY (*) CLEAR    Specific Gravity, Urine 1.019  1.005 - 1.030    pH 5.5  5.0 - 8.0    Glucose, UA NEGATIVE  NEGATIVE (mg/dL)   Hgb  urine dipstick LARGE (*) NEGATIVE    Bilirubin Urine NEGATIVE  NEGATIVE    Ketones, ur NEGATIVE  NEGATIVE (mg/dL)   Protein, ur NEGATIVE  NEGATIVE (mg/dL)   Urobilinogen, UA 0.2  0.0 - 1.0 (mg/dL)   Nitrite NEGATIVE  NEGATIVE    Leukocytes, UA NEGATIVE  NEGATIVE   URINE MICROSCOPIC-ADD ON      Component Value Range   Squamous Epithelial / LPF MANY (*) RARE    RBC / HPF 3-6  <3 (RBC/hpf)   Bacteria, UA FEW (*) RARE    Urine-Other MUCOUS PRESENT    POCT I-STAT, CHEM 8      Component Value Range   Sodium 139  135 - 145 (mEq/L)   Potassium 3.5  3.5 - 5.1 (mEq/L)   Chloride 102  96 - 112 (mEq/L)   BUN 8  6 - 23 (mg/dL)   Creatinine, Ser 1.30  0.50 - 1.10 (mg/dL)   Glucose, Bld 865 (*) 70 - 99 (mg/dL)   Calcium, Ion 7.84  6.96 - 1.32 (mmol/L)   TCO2 25  0 - 100 (mmol/L)   Hemoglobin 12.6  12.0 - 15.0 (g/dL)   HCT 29.5  28.4 - 13.2 (%)  CBC      Component Value Range   WBC 8.6  4.0 - 10.5 (K/uL)   RBC 4.22  3.87 - 5.11 (MIL/uL)   Hemoglobin 12.5  12.0 - 15.0 (g/dL)   HCT 44.0 (*) 10.2 - 46.0 (%)   MCV 83.4  78.0 - 100.0 (fL)   MCH 29.6  26.0 - 34.0 (pg)   MCHC 35.5  30.0 - 36.0 (g/dL)   RDW 72.5  36.6 - 44.0 (%)   Platelets 387  150 - 400 (K/uL)  DIFFERENTIAL      Component Value Range   Neutrophils Relative 54  43 - 77 (%)   Neutro Abs 4.7  1.7 - 7.7 (K/uL)   Lymphocytes Relative 36  12 - 46 (%)   Lymphs Abs 3.1  0.7 - 4.0 (K/uL)   Monocytes Relative 8  3 - 12 (%)   Monocytes Absolute 0.7  0.1 - 1.0 (K/uL)   Eosinophils Relative 1  0 - 5 (%)   Eosinophils Absolute 0.1  0.0 - 0.7 (K/uL)   Basophils Relative 0  0 - 1 (%)   Basophils Absolute 0.0  0.0 - 0.1 (K/uL)  WET PREP, GENITAL      Component Value Range   Yeast Wet Prep HPF POC NONE SEEN  NONE SEEN    Trich, Wet Prep NONE SEEN  NONE SEEN    Clue Cells Wet Prep HPF POC MODERATE (*) NONE SEEN    WBC, Wet Prep HPF POC FEW (*) NONE SEEN    US Transvaginal Non-ob  11/03/2011  *RADIOLOGY REPORT*  Clinical Data: Post  hysterectomy and tubal ligation.  Right lower abdominal pain.  TRANSABDOMINAL ULTRASOUND OF PELVIS  Technique:  Transabdominal ultrasound examination of the pelvis was performed including evaluation of the uterus, ovaries, adnexal regions, and pelvic cul-de-sac.  Comparison:  04/05/2007 CT.  Findings:  Uterus:  Surgically removed.  Vaginal cuff unremarkable.  Right ovary: 2.7 x 1.8 x 2.1 cm.  No dominant mass.  Left ovary: Not visualized.  Other Findings:  No free fluid  IMPRESSION: Post hysterectomy.  Left ovary not visualized.  Right ovary unremarkable.  Original Report Authenticated By: Fuller Canada, M.D.   US Pelvis Complete  11/03/2011  *RADIOLOGY REPORT*  Clinical Data: Post hysterectomy and tubal ligation.  Right lower abdominal pain.  TRANSABDOMINAL ULTRASOUND OF PELVIS  Technique:  Transabdominal ultrasound examination of the pelvis was performed including evaluation of the uterus, ovaries, adnexal regions, and pelvic cul-de-sac.  Comparison:  04/05/2007 CT.  Findings:  Uterus:  Surgically removed.  Vaginal cuff unremarkable.  Right ovary: 2.7 x 1.8 x 2.1 cm.  No dominant mass.  Left ovary: Not visualized.  Other Findings:  No free fluid  IMPRESSION: Post hysterectomy.  Left ovary not visualized.  Right ovary unremarkable.  Original Report Authenticated By: Fuller Canada, M.D.   Ct Abdomen Pelvis W Contrast  11/03/2011  **ADDENDUM** CREATED: 11/03/2011 11:25:55  Contrast:  100 ml Omnipaque-300.  **END ADDENDUM** SIGNED BY: Almedia Balls. Constance Goltz, M.D.    11/03/2011  *RADIOLOGY REPORT*  Clinical Data: Right lower quadrant pain.  CT ABDOMEN AND PELVIS WITH CONTRAST  Technique:  Multidetector CT imaging of the abdomen and pelvis was performed following the standard protocol during bolus administration of intravenous contrast.  Contrast:  Comparison: 03/08 pain 05/07/2012 pelvic sonogram.  05/14/2009 CT.  Findings: No extraluminal bowel inflammatory process, free fluid or free air.  Specifically, no  inflammation surrounds the superiorly directed appendix.  Suggestion of mild fatty infiltration of the liver.  Minimally lobulated contour without other findings of cirrhosis.  Post cholecystectomy.  No focal hepatic, splenic, pancreatic, adrenal or renal mass.  No abdominal aortic aneurysm.  Partial calcification lower abdominal aorta.  Post hysterectomy.  Decompressed urinary bladder.  Degenerative changes lower lumbar spine.  No bony destructive lesion.  No pelvic, retroperitoneal or mesenteric adenopathy.  Visualized lung bases clear.  IMPRESSION: No inflammatory process noted.  Please see above.  Original Report Authenticated By: Fuller Canada, M.D.      MDM  Right lower quadrant abdominal pain. Complaints of dysuria with no evidence of urinary tract infection on UA. We'll obtain labs. We'll perform pelvic exam.  No pelvic motion tenderness.  RLQ tender on palpation.  Normal WBC.  Will obtain Pelvic US for further evaluation.  Low suspicion for appendicitis. Low suspicion for kidney stone.  Pt does have evidence of large Hgb on urine dipstick, but it is her baseline when compared to prior UA result.    8:00 AM Wet prep shows evidence of BV.  Pt does complain of mild dysurea. Will treat for BV with Flagyl.    8:37 AM Pelvis US and Transvaginal US shows no acute finding.  Pt continues to endorse RLQ pain.      1:56 PM Abd CT scan shows no acute finding.  Reassurance given.  Will discharge with f/u instruction.    Fayrene Helper, PA-C 11/03/11 1357

## 2011-11-03 NOTE — ED Notes (Signed)
Patient transported to CT 

## 2011-11-03 NOTE — ED Notes (Signed)
Pt c/o suprapubic pain and occasional dysuria.

## 2011-11-04 LAB — GC/CHLAMYDIA PROBE AMP, GENITAL
Chlamydia, DNA Probe: NEGATIVE
GC Probe Amp, Genital: NEGATIVE

## 2011-11-07 NOTE — ED Provider Notes (Signed)
Medical screening examination/treatment/procedure(s) were performed by non-physician practitioner and as supervising physician I was immediately available for consultation/collaboration.  Korbyn Vanes, MD 11/07/11 1928 

## 2012-06-04 ENCOUNTER — Emergency Department (HOSPITAL_COMMUNITY): Payer: BC Managed Care – PPO

## 2012-06-04 ENCOUNTER — Other Ambulatory Visit: Payer: Self-pay | Admitting: Family Medicine

## 2012-06-04 ENCOUNTER — Encounter (HOSPITAL_COMMUNITY): Payer: Self-pay

## 2012-06-04 ENCOUNTER — Emergency Department (HOSPITAL_COMMUNITY)
Admission: EM | Admit: 2012-06-04 | Discharge: 2012-06-04 | Disposition: A | Discharge status: Home / Self Care | Payer: BC Managed Care – PPO | Attending: Emergency Medicine | Admitting: Emergency Medicine

## 2012-06-04 DIAGNOSIS — R12 Heartburn: Secondary | ICD-10-CM | POA: Insufficient documentation

## 2012-06-04 DIAGNOSIS — R1013 Epigastric pain: Secondary | ICD-10-CM | POA: Insufficient documentation

## 2012-06-04 DIAGNOSIS — I10 Essential (primary) hypertension: Secondary | ICD-10-CM | POA: Insufficient documentation

## 2012-06-04 DIAGNOSIS — R109 Unspecified abdominal pain: Secondary | ICD-10-CM

## 2012-06-04 HISTORY — DX: Essential (primary) hypertension: I10

## 2012-06-04 HISTORY — DX: Bronchitis, not specified as acute or chronic: J40

## 2012-06-04 LAB — URINE MICROSCOPIC-ADD ON

## 2012-06-04 LAB — URINALYSIS, ROUTINE W REFLEX MICROSCOPIC
Leukocytes, UA: NEGATIVE
Nitrite: NEGATIVE
Specific Gravity, Urine: 1.015 (ref 1.005–1.030)
pH: 6 (ref 5.0–8.0)

## 2012-06-04 LAB — LIPASE, BLOOD: Lipase: 35 U/L (ref 11–59)

## 2012-06-04 LAB — COMPREHENSIVE METABOLIC PANEL
Albumin: 3.9 g/dL (ref 3.5–5.2)
Alkaline Phosphatase: 93 U/L (ref 39–117)
BUN: 8 mg/dL (ref 6–23)
Potassium: 3.3 mEq/L — ABNORMAL LOW (ref 3.5–5.1)
Total Protein: 7.9 g/dL (ref 6.0–8.3)

## 2012-06-04 LAB — CBC WITH DIFFERENTIAL/PLATELET
Hemoglobin: 13.3 g/dL (ref 12.0–15.0)
Lymphs Abs: 3 10*3/uL (ref 0.7–4.0)
Monocytes Relative: 7 % (ref 3–12)
Neutro Abs: 5.4 10*3/uL (ref 1.7–7.7)
Neutrophils Relative %: 59 % (ref 43–77)
RBC: 4.46 MIL/uL (ref 3.87–5.11)

## 2012-06-04 MED ORDER — SODIUM CHLORIDE 0.9 % IV SOLN
1000.0000 mL | Freq: Once | INTRAVENOUS | Status: AC
  Administered 2012-06-04: 1000 mL via INTRAVENOUS

## 2012-06-04 MED ORDER — HYDROCODONE-ACETAMINOPHEN 5-325 MG PO TABS: 1.0000 | ORAL_TABLET | Freq: Four times a day (QID) | ORAL | Status: DC | PRN

## 2012-06-04 MED ORDER — PANTOPRAZOLE SODIUM 20 MG PO TBEC: 40.0000 mg | DELAYED_RELEASE_TABLET | Freq: Every day | ORAL | Status: DC

## 2012-06-04 MED ORDER — ONDANSETRON HCL 4 MG/2ML IJ SOLN
4.0000 mg | Freq: Once | INTRAMUSCULAR | Status: AC
  Administered 2012-06-04: 4 mg via INTRAVENOUS

## 2012-06-04 MED ORDER — ONDANSETRON 8 MG PO TBDP: 8.0000 mg | ORAL_TABLET | Freq: Three times a day (TID) | ORAL | Status: DC | PRN

## 2012-06-04 MED ORDER — SODIUM CHLORIDE 0.9 % IV SOLN
1000.0000 mL | INTRAVENOUS | Status: DC
Start: 2012-06-04 — End: 2012-06-04
  Administered 2012-06-04: 1000 mL via INTRAVENOUS

## 2012-06-04 MED ORDER — ONDANSETRON HCL 4 MG/2ML IJ SOLN
INTRAMUSCULAR | Status: AC
  Filled 2012-06-04: qty 2

## 2012-06-04 MED ORDER — PANTOPRAZOLE SODIUM 40 MG IV SOLR
40.0000 mg | Freq: Once | INTRAVENOUS | Status: AC
  Administered 2012-06-04: 40 mg via INTRAVENOUS
  Filled 2012-06-04: qty 40

## 2012-06-04 MED ORDER — ONDANSETRON HCL 4 MG/2ML IJ SOLN
4.0000 mg | Freq: Once | INTRAMUSCULAR | Status: AC
  Administered 2012-06-04: 4 mg via INTRAVENOUS
  Filled 2012-06-04: qty 2

## 2012-06-04 MED ORDER — IOHEXOL 300 MG/ML  SOLN
100.0000 mL | Freq: Once | INTRAMUSCULAR | Status: AC | PRN
  Administered 2012-06-04: 100 mL via INTRAVENOUS

## 2012-06-04 MED ORDER — HYDROMORPHONE HCL PF 1 MG/ML IJ SOLN
1.0000 mg | Freq: Once | INTRAMUSCULAR | Status: AC
  Administered 2012-06-04: 1 mg via INTRAVENOUS
  Filled 2012-06-04: qty 1

## 2012-06-04 NOTE — ED Notes (Signed)
Attempted to get discharge temperature but pt refused

## 2012-06-04 NOTE — ED Notes (Signed)
Patient transported to CT 

## 2012-06-04 NOTE — ED Notes (Signed)
Pt stated that she could not urinate.  Will try in 30 minutes. 

## 2012-06-04 NOTE — ED Provider Notes (Addendum)
History     CSN: [id number]  Arrival date & time 06/04/12  0003   First MD Initiated Contact with Patient 06/04/12 0031      Chief Complaint  Patient presents with  . Abdominal Pain     Patient is a 46 y.o. female presenting with abdominal pain. The history is provided by the patient.  Abdominal Pain The primary symptoms of the illness include abdominal pain, nausea and vomiting. The primary symptoms of the illness do not include fever. Episode onset: this evening. The onset of the illness was sudden.  The abdominal pain is located in the epigastric region. Pain radiation: to chest. The abdominal pain is relieved by nothing.  Additional symptoms associated with the illness include heartburn. Symptoms associated with the illness do not include chills or urgency.    Past Medical History  Diagnosis Date  . History of migraine headaches   . Impaired fasting glucose   . Bronchitis   . Hypertension     Past Surgical History  Procedure Date  . Cholecystectomy   . Abdominal hysterectomy 2002    Family History  Problem Relation Age of Onset  . Arthritis Paternal Grandmother   . Diabetes Paternal Grandmother   . Hypertension Paternal Grandmother   . Stroke Paternal Grandmother     History  Substance Use Topics  . Smoking status: Current Some Day Smoker  . Smokeless tobacco: Never Used  . Alcohol Use: No    OB History    Grav Para Term Preterm Abortions TAB SAB Ect Mult Living                  Review of Systems  Constitutional: Negative for fever and chills.  Gastrointestinal: Positive for heartburn, nausea, vomiting and abdominal pain.  Genitourinary: Negative for urgency.  All other systems reviewed and are negative.    Allergies  Review of patient's allergies indicates no known allergies.  Home Medications   Current Outpatient Rx  Name Route Sig Dispense Refill  . TELMISARTAN-HCTZ 40-12.5 MG PO TABS Oral Take 1 tablet by mouth daily.        BP 119/86   Pulse 100  Temp 98.2 F (36.8 C) (Oral)  Resp 20  SpO2 100%  Physical Exam  Nursing note and vitals reviewed. Constitutional: She appears well-developed and well-nourished. No distress.  HENT:  Head: Normocephalic and atraumatic.  Right Ear: External ear normal.  Left Ear: External ear normal.  Eyes: Conjunctivae normal are normal. Right eye exhibits no discharge. Left eye exhibits no discharge. No scleral icterus.  Neck: Neck supple. No tracheal deviation present.  Cardiovascular: Normal rate, regular rhythm and intact distal pulses.   Pulmonary/Chest: Effort normal and breath sounds normal. No stridor. No respiratory distress. She has no wheezes. She has no rales.  Abdominal: Soft. Bowel sounds are normal. She exhibits no distension. There is tenderness in the epigastric area. There is no rebound and no guarding.  Musculoskeletal: She exhibits no edema and no tenderness.  Neurological: She is alert. She has normal strength. No sensory deficit. Cranial nerve deficit:  no gross defecits noted. She exhibits normal muscle tone. She displays no seizure activity. Coordination normal.  Skin: Skin is warm and dry. No rash noted.  Psychiatric: She has a normal mood and affect.    ED Course  Procedures (including critical care time)  Labs Reviewed  COMPREHENSIVE METABOLIC PANEL - Abnormal; Notable for the following:    Potassium 3.3 (*)     Glucose, Bld 141 (*)  Total Bilirubin 0.2 (*)     All other components within normal limits  URINALYSIS, ROUTINE W REFLEX MICROSCOPIC - Abnormal; Notable for the following:    APPearance CLOUDY (*)     Hgb urine dipstick MODERATE (*)     All other components within normal limits  CBC WITH DIFFERENTIAL - Abnormal; Notable for the following:    MCHC 36.3 (*)     Platelets 417 (*)     All other components within normal limits  URINE MICROSCOPIC-ADD ON - Abnormal; Notable for the following:    Squamous Epithelial / LPF MANY (*)     Bacteria, UA  FEW (*)     All other components within normal limits  LIPASE, BLOOD   Ct Abdomen Pelvis W Contrast  06/04/2012  *RADIOLOGY REPORT*  Clinical Data: Epigastric pain.  Nausea and vomiting.  Heartburn. White cell count 9.2.  CT ABDOMEN AND PELVIS WITH CONTRAST  Technique:  Multidetector CT imaging of the abdomen and pelvis was performed following the standard protocol during bolus administration of intravenous contrast.  Contrast: OMNIPAQUE IOHEXOL 300 MG/ML  SOLN  Comparison: 11/03/2011  Findings: The lung bases are clear.  The surgical absence of the gallbladder.  The liver, spleen, pancreas, adrenal glands, kidneys, and retroperitoneal lymph nodes are unremarkable.  Mild calcification of the abdominal aorta without aneurysm.  The stomach, the small bowel, and colon are mostly decompressed.  No abnormal distension.  Stool scattered throughout the colon.  No free air or free fluid in the abdomen.  Pelvis:  The appendix is normal.  The uterus appears to be surgically absent.  The bladder wall is not thickened.  No free or loculated pelvic fluid collections.  Diverticula in the sigmoid colon without diverticulitis.  No abnormal pelvic lymphadenopathy. Normal alignment of the lumbar vertebra with mild degenerative change.  No significant changes since the previous study.  IMPRESSION: No acute process demonstrated in the abdomen or pelvis.  No evidence of bowel obstruction.   Original Report Authenticated By: Marlon Pel, M.D.    Dg Abd Acute W/chest  06/04/2012  *RADIOLOGY REPORT*  Clinical Data: Severe lower abdominal pain and nausea.  ACUTE ABDOMEN SERIES (ABDOMEN 2 VIEW & CHEST 1 VIEW)  Comparison: 11/03/2011 abdominal pelvic CT.  Plain film chest of 05/12/2011.  Findings: Frontal view of the chest demonstrates midline trachea. Normal heart size and mediastinal contours. No pleural effusion or pneumothorax.  Right upper lobe scarring.  Abominal films demonstrate no free intraperitoneal air on  upright positioning.  Clothing artifact over the midabdomen. Cholecystectomy clips. No significant air fluid levels.  No significant bowel distention on supine imaging.  Phleboliths in the pelvis. Distal gas and stool.  No abnormal abdominal calcifications.   No appendicolith.  IMPRESSION: No acute findings.   Original Report Authenticated By: Consuello Bossier, M.D.       MDM  I reviewed the patient's old records. She has had history of recurrent episodes of abdominal pain. She was last seen in the emergency department in March of this year. She did have a CT scan of the abdomen and pelvis performed at that time that was unremarkable. Looking back at her chest she has had several abdominal pelvic CT scans prior to that.  At this time etiology of her pain is unclear. She is however feeling better. There is no evidence of bowel obstruction on her plain films. I have low suspicion for appendicitis. There is a small amount of hematuria noted but that previously  he has been noted on prior testing as well. At this point there does not appear to be any acute emergency medical condition. I feel it is reasonable for the patient followup as an outpatient with a primary care doctor gastrointestinal doctor. I have discussed the reasons to return to the emergency room   4:30 AM Discussed discharge however pt is still having severe ttp in epigastrum.  Will give additional dose of pain meds.  Ct abd/pelvis to rule out obstruction or other acute pathology.  7:56 AM CT scan was unremarkable.  Continue with outpatient follow.  Celene Kras, MD 06/04/12 [phone]

## 2012-06-04 NOTE — ED Notes (Signed)
Pt reports burning sharp upper mid abdomen that started at 2300. Pt hx of gallstones. Reports still having an appendix.

## 2012-06-07 ENCOUNTER — Encounter: Payer: Self-pay | Admitting: Family Medicine

## 2012-06-07 ENCOUNTER — Ambulatory Visit (INDEPENDENT_AMBULATORY_CARE_PROVIDER_SITE_OTHER): Payer: BC Managed Care – PPO | Admitting: Family Medicine

## 2012-06-07 VITALS — BP 120/70 | HR 90 | Temp 98.3°F | Wt 173.0 lb

## 2012-06-07 DIAGNOSIS — R109 Unspecified abdominal pain: Secondary | ICD-10-CM

## 2012-06-07 DIAGNOSIS — R319 Hematuria, unspecified: Secondary | ICD-10-CM

## 2012-06-07 LAB — POCT URINALYSIS DIPSTICK
Bilirubin, UA: NEGATIVE
Ketones, UA: NEGATIVE
Leukocytes, UA: NEGATIVE
Nitrite, UA: NEGATIVE

## 2012-06-07 MED ORDER — HYOSCYAMINE SULFATE ER 0.375 MG PO TB12: 0.3750 mg | ORAL_TABLET | Freq: Two times a day (BID) | ORAL | Status: DC | PRN

## 2012-06-07 NOTE — Progress Notes (Signed)
  Subjective:    Patient ID: Carol [name], female    DOB: 12/30/1965, 46 y.o.   MRN: [id number]  HPI She is here for recheck after recent hospital visit for the acute onset of midepigastric pain, nausea vomiting. The ER record was reviewed. She did have a CT scan which was negative. Urinalysis did show RBCs on dip stick. Presently she is having intermittent midepigastric pain that can last for several minutes. She states it feels like a cramping kind of pain. Food has no effect. She does not drink alcohol She does note that the Protonix does help. She notes that she has been told she has had blood in her urine since she's had her hysterectomy which was several years ago. She has no frequency, dysuria or urgency.   Review of Systems     Objective:   Physical Exam Alert and in slight abdominal distress. Bowel sounds are normal. Diffuse tenderness but no rebound is noted. Urinalysis did show 2-5 red cells /HPF       Assessment & Plan:   1. Hematuria  POCT Urinalysis Dipstick, Ambulatory referral to Urology  2. Abdominal pain  hyoscyamine (LEVBID) 0.375 MG 12 hr tablet   she is to double up on her Protonix to 40 twice a day as well as Levbid. She will let me know by Thursday and if no improvement, GI referral will be made.

## 2012-06-07 NOTE — Patient Instructions (Signed)
Take 2 of the Protonix twice per day and use the Levbid.

## 2012-06-10 ENCOUNTER — Telehealth: Payer: Self-pay | Admitting: Family Medicine

## 2012-06-10 NOTE — Telephone Encounter (Signed)
Pt was suppose to be contacted by [facility] she has apt 10/24 at 3:15 with Dr. Margarita Grizzle pt notifed

## 2012-07-05 ENCOUNTER — Emergency Department (HOSPITAL_COMMUNITY)
Admission: EM | Admit: 2012-07-05 | Discharge: 2012-07-05 | Disposition: A | Discharge status: Home / Self Care | Payer: BC Managed Care – PPO | Attending: Emergency Medicine | Admitting: Emergency Medicine

## 2012-07-05 ENCOUNTER — Other Ambulatory Visit: Payer: Self-pay | Admitting: Family Medicine

## 2012-07-05 ENCOUNTER — Ambulatory Visit
Admission: RE | Admit: 2012-07-05 | Discharge: 2012-07-05 | Disposition: A | Discharge status: Home / Self Care | Payer: BC Managed Care – PPO | Source: Ambulatory Visit | Attending: Family Medicine | Admitting: Family Medicine

## 2012-07-05 ENCOUNTER — Ambulatory Visit (INDEPENDENT_AMBULATORY_CARE_PROVIDER_SITE_OTHER): Payer: BC Managed Care – PPO | Admitting: Family Medicine

## 2012-07-05 ENCOUNTER — Encounter: Payer: Self-pay | Admitting: Family Medicine

## 2012-07-05 DIAGNOSIS — N200 Calculus of kidney: Secondary | ICD-10-CM

## 2012-07-05 DIAGNOSIS — F17200 Nicotine dependence, unspecified, uncomplicated: Secondary | ICD-10-CM | POA: Insufficient documentation

## 2012-07-05 DIAGNOSIS — D649 Anemia, unspecified: Secondary | ICD-10-CM | POA: Insufficient documentation

## 2012-07-05 DIAGNOSIS — Z9089 Acquired absence of other organs: Secondary | ICD-10-CM | POA: Insufficient documentation

## 2012-07-05 DIAGNOSIS — Z90710 Acquired absence of both cervix and uterus: Secondary | ICD-10-CM | POA: Insufficient documentation

## 2012-07-05 DIAGNOSIS — I10 Essential (primary) hypertension: Secondary | ICD-10-CM | POA: Insufficient documentation

## 2012-07-05 DIAGNOSIS — R110 Nausea: Secondary | ICD-10-CM | POA: Insufficient documentation

## 2012-07-05 DIAGNOSIS — Z8679 Personal history of other diseases of the circulatory system: Secondary | ICD-10-CM | POA: Insufficient documentation

## 2012-07-05 DIAGNOSIS — R1012 Left upper quadrant pain: Secondary | ICD-10-CM | POA: Insufficient documentation

## 2012-07-05 DIAGNOSIS — Z79899 Other long term (current) drug therapy: Secondary | ICD-10-CM | POA: Insufficient documentation

## 2012-07-05 DIAGNOSIS — R319 Hematuria, unspecified: Secondary | ICD-10-CM

## 2012-07-05 DIAGNOSIS — Z8709 Personal history of other diseases of the respiratory system: Secondary | ICD-10-CM | POA: Insufficient documentation

## 2012-07-05 DIAGNOSIS — R109 Unspecified abdominal pain: Secondary | ICD-10-CM

## 2012-07-05 LAB — CBC WITH DIFFERENTIAL/PLATELET
Basophils Absolute: 0 10*3/uL (ref 0.0–0.1)
Basophils Relative: 0 % (ref 0–1)
Eosinophils Absolute: 0.1 10*3/uL (ref 0.0–0.7)
Hemoglobin: 11.7 g/dL — ABNORMAL LOW (ref 12.0–15.0)
MCH: 29.3 pg (ref 26.0–34.0)
MCHC: 35.1 g/dL (ref 30.0–36.0)
Neutro Abs: 4.8 10*3/uL (ref 1.7–7.7)
Neutrophils Relative %: 58 % (ref 43–77)
Platelets: 373 10*3/uL (ref 150–400)
RDW: 13 % (ref 11.5–15.5)

## 2012-07-05 LAB — POCT URINALYSIS DIPSTICK
Bilirubin, UA: NEGATIVE
Blood, UA: 250
Leukocytes, UA: NEGATIVE
Nitrite, UA: NEGATIVE
Urobilinogen, UA: NEGATIVE
pH, UA: 6

## 2012-07-05 LAB — URINALYSIS, MICROSCOPIC ONLY
Nitrite: NEGATIVE
Protein, ur: NEGATIVE mg/dL
Specific Gravity, Urine: 1.021 (ref 1.005–1.030)
Urobilinogen, UA: 0.2 mg/dL (ref 0.0–1.0)

## 2012-07-05 LAB — COMPREHENSIVE METABOLIC PANEL
ALT: 19 U/L (ref 0–35)
Alkaline Phosphatase: 94 U/L (ref 39–117)
BUN: 8 mg/dL (ref 6–23)
CO2: 25 mEq/L (ref 19–32)
Calcium: 9.3 mg/dL (ref 8.4–10.5)
GFR calc Af Amer: >90 mL/min (ref >90)
GFR calc non Af Amer: >90 mL/min (ref >90)
Glucose, Bld: 192 mg/dL — ABNORMAL HIGH (ref 70–99)
Sodium: 140 mEq/L (ref 135–145)
Total Protein: 7.5 g/dL (ref 6.0–8.3)

## 2012-07-05 LAB — LIPASE, BLOOD: Lipase: 33 U/L (ref 11–59)

## 2012-07-05 MED ORDER — ONDANSETRON 8 MG PO TBDP: 8.0000 mg | ORAL_TABLET | Freq: Three times a day (TID) | ORAL | Status: DC | PRN

## 2012-07-05 MED ORDER — OMEPRAZOLE 40 MG PO CPDR: 40.0000 mg | DELAYED_RELEASE_CAPSULE | Freq: Every day | ORAL | Status: DC

## 2012-07-05 MED ORDER — HYDROMORPHONE HCL PF 1 MG/ML IJ SOLN
1.0000 mg | Freq: Once | INTRAMUSCULAR | Status: AC
  Administered 2012-07-05: 1 mg via INTRAVENOUS
  Filled 2012-07-05: qty 1

## 2012-07-05 MED ORDER — HYDROCODONE-ACETAMINOPHEN 7.5-750 MG PO TABS: 1.0000 | ORAL_TABLET | Freq: Three times a day (TID) | ORAL | Status: DC | PRN

## 2012-07-05 MED ORDER — KETOROLAC TROMETHAMINE 60 MG/2ML IM SOLN
60.0000 mg | Freq: Once | INTRAMUSCULAR | Status: AC
  Administered 2012-07-05: 60 mg via INTRAMUSCULAR

## 2012-07-05 MED ORDER — ONDANSETRON HCL 4 MG/2ML IJ SOLN
4.0000 mg | Freq: Once | INTRAMUSCULAR | Status: AC
  Administered 2012-07-05: 4 mg via INTRAVENOUS
  Filled 2012-07-05: qty 2

## 2012-07-05 NOTE — ED Provider Notes (Signed)
History     CSN: [id number] Arrival date & time 07/05/12  1955 First MD Initiated Contact with Patient 07/05/12 2048      Chief Complaint  Patient presents with  . Flank Pain   HPI Pt has been having pain in her left flank for the last month.  It seemed to have eased off but then increased again last night.  The pain is located  Left upper abdomen and flank.  It feels like a cramping pain that is severe.  Food does not seem to particularly effect it.  She saw her doctor today for follow up.  He ordered an outpatient CT scan.  Pt was told she does not have kidney stones.  SHe was prescribed medications for pain and is going to be referred to a gastroenterologist. [facility] took the outpatient medications but they have not helped so she came to the ED.   Past Medical History  Diagnosis Date  . History of migraine headaches   . Impaired fasting glucose   . Bronchitis   . Hypertension     Past Surgical History  Procedure Date  . Cholecystectomy   . Abdominal hysterectomy 2002    Family History  Problem Relation Age of Onset  . Arthritis Paternal Grandmother   . Diabetes Paternal Grandmother   . Hypertension Paternal Grandmother   . Stroke Paternal Grandmother     History  Substance Use Topics  . Smoking status: Current Some Day Smoker  . Smokeless tobacco: Never Used  . Alcohol Use: No    OB History    Grav Para Term Preterm Abortions TAB SAB Ect Mult Living                  Review of Systems  Constitutional: Negative for fever.  Respiratory: Negative for shortness of breath.   Cardiovascular: Negative for chest pain.  Gastrointestinal: Positive for nausea and abdominal pain. Negative for diarrhea, blood in stool, abdominal distention and anal bleeding.  Genitourinary: Negative for dysuria.  All other systems reviewed and are negative.    Allergies  Review of patient's allergies indicates no known allergies.  Home Medications   Current Outpatient Rx  Name   Route  Sig  Dispense  Refill  . VITAMIN B-12 PO   Oral   Take 1 tablet by mouth daily.         Marland Kitchen GINKOBA PO   Oral   Take 1 tablet by mouth daily.         Marland Kitchen HYDROCODONE-ACETAMINOPHEN 7.5-750 MG PO TABS   Oral   Take 1 tablet by mouth every 8 (eight) hours as needed for pain.   30 tablet   0   . TELMISARTAN-HCTZ 40-12.5 MG PO TABS   Oral   Take 1 tablet by mouth daily.             BP 141/71  Pulse 84  Temp 98.5 F (36.9 C)  Resp 18  SpO2 100%  Physical Exam  Nursing note and vitals reviewed. Constitutional: She appears well-developed and well-nourished. She appears distressed (uncomfortable).  HENT:  Head: Normocephalic and atraumatic.  Right Ear: External ear normal.  Left Ear: External ear normal.  Eyes: Conjunctivae normal are normal. Right eye exhibits no discharge. Left eye exhibits no discharge. No scleral icterus.  Neck: Neck supple. No tracheal deviation present.  Cardiovascular: Normal rate, regular rhythm and intact distal pulses.   Pulmonary/Chest: Effort normal and breath sounds normal. No stridor. No respiratory distress. She has  no wheezes. She has no rales.  Abdominal: Soft. Bowel sounds are normal. She exhibits no distension, no fluid wave, no ascites and no mass. There is tenderness in the left upper quadrant. There is no rigidity, no rebound, no guarding and no CVA tenderness. No hernia.  Musculoskeletal: She exhibits no edema and no tenderness.  Neurological: She is alert. She has normal strength. No sensory deficit. Cranial nerve deficit:  no gross defecits noted. She exhibits normal muscle tone. She displays no seizure activity. Coordination normal.  Skin: Skin is warm and dry. No rash noted.  Psychiatric: She has a normal mood and affect.    ED Course  Procedures (including critical care time) ED Course: IV pain medications, IV fluids  Labs Reviewed  CBC WITH DIFFERENTIAL - Abnormal; Notable for the following:    Hemoglobin 11.7 (*)      HCT 33.3 (*)     All other components within normal limits  URINALYSIS, MICROSCOPIC ONLY - Abnormal; Notable for the following:    APPearance CLOUDY (*)     Hgb urine dipstick MODERATE (*)     Leukocytes, UA TRACE (*)     Squamous Epithelial / LPF MANY (*)     All other components within normal limits  COMPREHENSIVE METABOLIC PANEL - Abnormal; Notable for the following:    Glucose, Bld 192 (*)     Total Bilirubin 0.2 (*)     All other components within normal limits  LIPASE, BLOOD   Ct Abdomen Pelvis Wo Contrast  07/05/2012  *RADIOLOGY REPORT*  Clinical Data: Left lower quadrant abdominal pain.  CT ABDOMEN AND PELVIS WITHOUT CONTRAST  Technique:  Multidetector CT imaging of the abdomen and pelvis was performed following the standard protocol without intravenous contrast.  Comparison: 06/04/2012.  Findings: The lung bases are clear.  The liver is unremarkable and stable.  No focal lesions or biliary dilatation.  The gallbladder is surgically absent.  No common bile duct dilatation.  The pancreas is unremarkable and the spleen is normal in size.  The adrenal glands and kidneys are normal.  No renal or obstructing ureteral calculi.  No bladder calculi.  The stomach, duodenum, small bowel and colon are grossly normal without oral contrast.  The appendix is normal.  No mesenteric or retroperitoneal mass or lymphadenopathy.  Scattered lymph nodes are stable.  The aorta is normal in caliber.  The major branch vessels appear normal.  The uterus is surgically absent.  Both ovaries are still present and appear normal.  No pelvic mass, adenopathy or free pelvic fluid collections.  No inguinal mass or hernia.  The bony structures are unremarkable.  IMPRESSION:  1.  No acute abdominal/pelvic findings, mass lesions or adenopathy. 2.  No renal or obstructing ureteral calculi. 3.  No change since recent prior study 06/04/2012.   Original Report Authenticated By: Rudie Meyer, M.D.      1. Abdominal pain   2.  Anemia       MDM  Mild anemia noted however patient denies hematemesis or rectal bleeding.  Pt had an outpatient CT scan today without acute findings.  At this point, no sign of acute emergency /surgical abdominal condition.  Per outpatient notes, she is going to be referred to GI.  Will dc home with the addition of a PPI empirically.  Continue outpatient follow up.  Return to the ED prn.        Celene Kras, MD 07/05/12 [phone]

## 2012-07-05 NOTE — ED Notes (Signed)
Discharge instructions given. Rx x2 given. All questions answered.

## 2012-07-05 NOTE — ED Notes (Signed)
Pt c/o of flank pain x1 month and was seen. Pt needs a med refill.pain 12/10.VSS.PWD

## 2012-07-05 NOTE — Progress Notes (Signed)
Hydrocodone called in per jcl

## 2012-07-05 NOTE — ED Notes (Signed)
Patient was seen by primary care MD today and patient was sent to imaging and had CT scan done.  Results were negative.  Pt given prescription for vicodin and took one at 1930.  Pt rates pain as a 10 and says the pain is like a contraction.  Denies nausea and vomiting.  Denies dysuria.

## 2012-07-05 NOTE — ED Notes (Signed)
Patient given 1mg  Dilaudid IV. Patient very groggy. VSS.

## 2012-07-06 ENCOUNTER — Telehealth: Payer: Self-pay | Admitting: Family Medicine

## 2012-07-06 ENCOUNTER — Encounter: Payer: Self-pay | Admitting: Gastroenterology

## 2012-07-06 ENCOUNTER — Telehealth: Payer: Self-pay

## 2012-07-06 NOTE — Telephone Encounter (Signed)
Called pt left message for pt to call me back and let me know how things are going

## 2012-07-06 NOTE — Progress Notes (Signed)
  Subjective:    Patient ID: Carol [name], female    DOB: [birth year], 46 y.o.   MRN: [id number]  HPI She is here for evaluation of continued intermittent difficulty with abdominal pain. She had been seen in the past in the emergency room and apparently no definitive diagnosis was made. She has had 3 or 4 days worth of abdominal pain but no vomiting or diarrhea, fever chills or urinary symptoms.  Review of Systems     Objective:   Physical Exam Alert and complaining of abdominal pain. Abdominal exam shows tenderness in the left quadrant no true rebound or point tenderness. Bowel sounds were diminished. There was questionable rebound. Urine dipstick did show red cells.      Assessment & Plan:   1. Hematuria  POCT Urinalysis Dipstick  2. Stone in kidney  ketorolac (TORADOL) injection 60 mg   she was sent for CT urogram. The urogram result came back around 6 that evening. It was negative. It centered to the emergency room for further evaluation. Vicodin ES was called in however I did not tell her to pick up

## 2012-07-06 NOTE — Telephone Encounter (Signed)
Pt has been called and appt made at [facility] nov 21 at 11 am arrive at 1045 pt is aware of appt

## 2012-07-08 ENCOUNTER — Ambulatory Visit (INDEPENDENT_AMBULATORY_CARE_PROVIDER_SITE_OTHER): Payer: BC Managed Care – PPO | Admitting: Gastroenterology

## 2012-07-08 ENCOUNTER — Encounter: Payer: Self-pay | Admitting: Gastroenterology

## 2012-07-08 VITALS — BP 120/80 | HR 70 | Ht 68.5 in | Wt 173.6 lb

## 2012-07-08 DIAGNOSIS — R109 Unspecified abdominal pain: Secondary | ICD-10-CM

## 2012-07-08 MED ORDER — PEG-KCL-NACL-NASULF-NA ASC-C 100 G PO SOLR: 1.0000 | Freq: Once | ORAL | Status: DC

## 2012-07-08 NOTE — Progress Notes (Signed)
History of Present Illness: This is a 46 year old female who presented to our office today at the request of her PCP for complaints of left sided abdominal pain.  Pain is located at the left mid abdomen and goes around to her back.  Has been present for the past month; may have gone away for a couple of days, but is back again and is worse than it was initially.  Says that it is severe and is described as feeling like a "contraction".  Does not seem to be affected by eating for the most part; in fact, she ate pancakes and sausage this morning without any worsening of the pain.  Hurts with movement.  Says that she cannot sleep on the left side at night.  CT scan proved to be unremarkable.  CBC, CMP were WNL's as well.  Denies any associated nausea, vomiting, constipation, diarrhea, bloody stool, decreased appetite or weight loss.  She says that prior to one month ago she did not have any pain, but there are two other CT scans in the computer that were performed for abdominal pain in October and March, which were normal as well.  She was given Vicodin in the ED but says that it does not help the pain at all.  She says that she was told that she might have an ulcer.  She is not on any NSAID's; she is on omeprazole 40 mg daily.    Review of Systems: Pertinent positive and negative review of systems were noted in the above HPI section. All other review of systems were otherwise negative.  Current Medications, Allergies, Past Medical History, Past Surgical History, Family History and Social History were reviewed in [employer] record.  Physical Exam: General: Well developed , well nourished, uncomfortable to move on exam table due to pain. Head: Normocephalic and atraumatic Eyes:  sclerae anicteric, EOMI Ears: Normal auditory acuity Mouth: No deformity or lesions Neck: Supple, no masses or thyromegaly Lungs: Clear throughout to auscultation Heart: Regular rate and rhythm; no murmurs,  rubs or bruits Abdomen: Soft, non-distended. No masses, hepatosplenomegaly or hernias noted. Normal Bowel sounds.  Cholecystectomy scar noted in RUQ.  Left mid to lateral abdominal tenderness to very light palpation even to her left flank. Rectal:  Deferred.  Will be done at the time of colonoscopy. Musculoskeletal: Symmetrical with no gross deformities  Skin: No lesions on visible extremities Pulses:  Normal pulses noted Extremities: No clubbing, cyanosis, edema or deformities noted Neurological: Alert oriented x 4, grossly nonfocal Cervical Nodes:  No significant cervical adenopathy Inguinal Nodes: No significant inguinal adenopathy Psychological:  Alert and cooperative. Normal mood and affect  Assessment and Recommendations: -Abdominal pain with tenderness to light palpation and with movement.  Likely musculoskeletal vs neuropathic pain vs radicular pain.  Will perform EGD and colonoscopy to complete evaluation, however, due to normal CT scans, labs, and absence of other GI symptoms, this seems to be non-GI related pain.  If endoscopies prove to be normal as well, then would recommend further evaluation for radicular pain and possible referral to pain clinic via her PCP. The risks, benefits, and alternatives to endoscopy with possible biopsy and possible dilation were discussed with the patient and they consent to proceed. The risks, benefits, and alternatives to colonoscopy with possible biopsy and possible polypectomy were discussed with the patient and they consent to proceed.

## 2012-07-08 NOTE — Patient Instructions (Addendum)
You have been scheduled for an endoscopy and colonoscopy with propofol. Please follow the written instructions given to you at your visit today. Please pick up your prep at the pharmacy within the next 1-3 days. If you use inhalers (even only as needed) or a CPAP machine, please bring them with you on the day of your procedure. 

## 2012-08-04 ENCOUNTER — Encounter (HOSPITAL_COMMUNITY): Payer: Self-pay | Admitting: *Deleted

## 2012-08-04 ENCOUNTER — Emergency Department (HOSPITAL_COMMUNITY)
Admission: EM | Admit: 2012-08-04 | Discharge: 2012-08-04 | Disposition: A | Discharge status: Home / Self Care | Payer: BC Managed Care – PPO | Attending: Emergency Medicine | Admitting: Emergency Medicine

## 2012-08-04 ENCOUNTER — Ambulatory Visit: Payer: BC Managed Care – PPO | Admitting: Medical

## 2012-08-04 DIAGNOSIS — R1310 Dysphagia, unspecified: Secondary | ICD-10-CM | POA: Insufficient documentation

## 2012-08-04 DIAGNOSIS — Z862 Personal history of diseases of the blood and blood-forming organs and certain disorders involving the immune mechanism: Secondary | ICD-10-CM | POA: Insufficient documentation

## 2012-08-04 DIAGNOSIS — I10 Essential (primary) hypertension: Secondary | ICD-10-CM | POA: Insufficient documentation

## 2012-08-04 DIAGNOSIS — Z8639 Personal history of other endocrine, nutritional and metabolic disease: Secondary | ICD-10-CM | POA: Insufficient documentation

## 2012-08-04 DIAGNOSIS — Y9389 Activity, other specified: Secondary | ICD-10-CM | POA: Insufficient documentation

## 2012-08-04 DIAGNOSIS — F17200 Nicotine dependence, unspecified, uncomplicated: Secondary | ICD-10-CM | POA: Insufficient documentation

## 2012-08-04 DIAGNOSIS — Y929 Unspecified place or not applicable: Secondary | ICD-10-CM | POA: Insufficient documentation

## 2012-08-04 DIAGNOSIS — Z79899 Other long term (current) drug therapy: Secondary | ICD-10-CM | POA: Insufficient documentation

## 2012-08-04 DIAGNOSIS — K115 Sialolithiasis: Secondary | ICD-10-CM | POA: Insufficient documentation

## 2012-08-04 DIAGNOSIS — T628X1A Toxic effect of other specified noxious substances eaten as food, accidental (unintentional), initial encounter: Secondary | ICD-10-CM | POA: Insufficient documentation

## 2012-08-04 DIAGNOSIS — Z8679 Personal history of other diseases of the circulatory system: Secondary | ICD-10-CM | POA: Insufficient documentation

## 2012-08-04 DIAGNOSIS — Z8709 Personal history of other diseases of the respiratory system: Secondary | ICD-10-CM | POA: Insufficient documentation

## 2012-08-04 MED ORDER — OXYCODONE-ACETAMINOPHEN 5-325 MG PO TABS: 1.0000 | ORAL_TABLET | Freq: Four times a day (QID) | ORAL | Status: DC | PRN

## 2012-08-04 NOTE — ED Notes (Signed)
Bed:WA08<BR> Expected date:<BR> Expected time:<BR> Means of arrival:<BR> Comments:<BR>

## 2012-08-04 NOTE — ED Provider Notes (Signed)
History     CSN: [id number]  Arrival date & time 08/04/12  1419   First MD Initiated Contact with Patient 08/04/12 1505      Chief Complaint  Patient presents with  . Allergic Reaction    (Consider location/radiation/quality/duration/timing/severity/associated sxs/prior treatment) Patient is a 46 y.o. female presenting with allergic reaction. The history is provided by the patient.  Allergic Reaction  Significant symptoms that are not present include rhinorrhea.   patient was sent in for possible anaphylaxis. She was eating a piece of chicken around noon today. She states she does taking her first bite when she felt the bottom part of her mouth feel like it was tightening up. There was some swelling on the right side of her neck. No difficulty breathing. Some mild difficulty swallowing. She has not had episodes like this before. She has no history of allergies. She was seen in urgent care and was given steroids and Benadryl. She was sent to the ER for further treatment. She's been feeling somewhat better since it happened.  Past Medical History  Diagnosis Date  . History of migraine headaches   . Impaired fasting glucose   . Bronchitis   . Hypertension     Past Surgical History  Procedure Date  . Cholecystectomy   . Abdominal hysterectomy 2002  . Tubal ligation     Family History  Problem Relation Age of Onset  . Arthritis Paternal Grandmother   . Diabetes Paternal Grandmother   . Hypertension Paternal Grandmother   . Stroke Paternal Grandmother   . Colon cancer Neg Hx     History  Substance Use Topics  . Smoking status: Current Some Day Smoker -- 20 years    Types: Cigarettes, Cigars  . Smokeless tobacco: Never Used     Comment: Tobacco info given  . Alcohol Use: Yes     Comment: occ.    OB History    Grav Para Term Preterm Abortions TAB SAB Ect Mult Living                  Review of Systems  Constitutional: Negative for appetite change and fatigue.   HENT: Positive for trouble swallowing. Negative for sore throat, rhinorrhea, mouth sores, neck stiffness, dental problem and sinus pressure.   Respiratory: Negative for choking and chest tightness.   Cardiovascular: Negative for leg swelling.  Musculoskeletal: Negative for back pain.  Neurological: Negative for light-headedness and headaches.  Hematological: Negative for adenopathy.  Psychiatric/Behavioral: Negative for behavioral problems.    Allergies  Review of patient's allergies indicates no known allergies.  Home Medications   Current Outpatient Rx  Name  Route  Sig  Dispense  Refill  . VITAMIN B-12 PO   Oral   Take 1 tablet by mouth daily.         Marland Kitchen GINKOBA PO   Oral   Take 1 tablet by mouth daily.         . TELMISARTAN-HCTZ 40-12.5 MG PO TABS   Oral   Take 1 tablet by mouth daily.           Marland Kitchen PEG-KCL-NACL-NASULF-NA ASC-C 100 G PO SOLR   Oral   Take 1 kit (100 g total) by mouth once.   1 kit   0     BP 143/79  Pulse 77  Temp 98 F (36.7 C) (Oral)  Resp 17  SpO2 96%  Physical Exam  Constitutional: She is oriented to person, place, and time. She appears well-developed and  well-nourished.  HENT:  Head: Normocephalic.  Mouth/Throat: Oropharynx is clear and moist.       Mild erethema at right submandibular gland duct. No fluctuance. Tenderness to floor of mouth on right. Posterior pharynx normal. No swelling of tongue.   Neck: Neck supple.       Mild tenderness and swelling below mandible on right.   Cardiovascular: Normal rate and regular rhythm.   Pulmonary/Chest: Effort normal and breath sounds normal. She has no wheezes.  Abdominal: There is no tenderness. There is no rebound.  Musculoskeletal: Normal range of motion.  Lymphadenopathy:    She has no cervical adenopathy.  Neurological: She is alert and oriented to person, place, and time.  Skin: Skin is warm. No erythema.    ED Course  Procedures (including critical care time)  Labs  Reviewed - No data to display No results found.   1. Sialolithiasis of submandibular gland       MDM  The patient sent from urgent care with presumed anaphylaxis. It does not appear to be in anaphylaxis. It is more likely a right-sided submandibular is salivary stone. It does not appear infected. Her pain is improved somewhat. She'll be discharged home with pain medicines and ENT followup as needed.   Juliet Rude. Rubin Payor, MD 08/04/12 [phone]

## 2012-08-04 NOTE — ED Notes (Signed)
Per EMS report:pt was at dr's office when she had allergic reaction to chicken she ate prior to going to MD's. Per EMS pt has pain and swelling on the left side of her neck.

## 2012-08-04 NOTE — ED Notes (Signed)
Pt sts she ate chicken this morning around 12:00 and immediately felt pain under her tongue and then noticed her right side of face was swollen. Pt went to Urgent Care on [address] and was solumedrol and benadryl. Pt sts pain 6/10 on the right side of face and neck.

## 2012-08-27 ENCOUNTER — Encounter: Payer: BC Managed Care – PPO | Admitting: Gastroenterology

## 2012-08-27 ENCOUNTER — Telehealth: Payer: Self-pay | Admitting: Gastroenterology

## 2012-08-27 NOTE — Telephone Encounter (Signed)
Yes charge 

## 2012-09-21 ENCOUNTER — Encounter: Payer: Self-pay | Admitting: Gastroenterology

## 2012-09-21 ENCOUNTER — Ambulatory Visit (AMBULATORY_SURGERY_CENTER): Payer: BC Managed Care – PPO

## 2012-09-21 VITALS — Ht 69.5 in | Wt 177.4 lb

## 2012-09-21 DIAGNOSIS — R109 Unspecified abdominal pain: Secondary | ICD-10-CM

## 2012-09-21 DIAGNOSIS — R143 Flatulence: Secondary | ICD-10-CM

## 2012-09-21 DIAGNOSIS — R141 Gas pain: Secondary | ICD-10-CM

## 2012-09-21 DIAGNOSIS — IMO0001 Reserved for inherently not codable concepts without codable children: Secondary | ICD-10-CM

## 2012-09-21 MED ORDER — MOVIPREP 100 G PO SOLR: ORAL | Status: DC

## 2012-10-07 ENCOUNTER — Encounter: Payer: Self-pay | Admitting: Gastroenterology

## 2012-10-07 ENCOUNTER — Ambulatory Visit (AMBULATORY_SURGERY_CENTER): Payer: BC Managed Care – PPO | Admitting: Gastroenterology

## 2012-10-07 VITALS — BP 122/72 | HR 67 | Temp 97.3°F | Resp 17 | Ht 68.5 in | Wt 177.0 lb

## 2012-10-07 DIAGNOSIS — R1012 Left upper quadrant pain: Secondary | ICD-10-CM

## 2012-10-07 DIAGNOSIS — D126 Benign neoplasm of colon, unspecified: Secondary | ICD-10-CM

## 2012-10-07 MED ORDER — SODIUM CHLORIDE 0.9 % IV SOLN: 500.0000 mL | INTRAVENOUS | Status: DC

## 2012-10-07 NOTE — Progress Notes (Signed)
Patient did not experience any of the following events: a burn prior to discharge; a fall within the facility; wrong site/side/patient/procedure/implant event; or a hospital transfer or hospital admission upon discharge from the facility. (G8907) Patient did not have preoperative order for IV antibiotic SSI prophylaxis. (G8918)  

## 2012-10-07 NOTE — Op Note (Signed)
[facility] [address] [place], [address]   ENDOSCOPY PROCEDURE REPORT  PATIENT: [name], Carol  MR#: [id number] BIRTHDATE: 1966/06/10 , 46  yrs. old GENDER: Female ENDOSCOPIST: Meryl Dare, MD, [facility] Graham REFERRED BY:  Sharlot Gowda, M.D. PROCEDURE DATE:  10/07/2012 PROCEDURE:  EGD, diagnostic ASA CLASS:     Class II INDICATIONS:  abdominal pain in the lower left quadrant.   abdominal pain in the lower right quadrant. MEDICATIONS: There was residual sedation effect present from prior procedure, Robinul 0.2 mg IV, MAC sedation, administered by CRNA, propofol (Diprivan) 100mg  IV TOPICAL ANESTHETIC: Cetacaine Spray DESCRIPTION OF PROCEDURE: After the risks benefits and alternatives of the procedure were thoroughly explained, informed consent was obtained.  The [facility] [id number] endoscope was introduced through the mouth and advanced to the second portion of the duodenum without limitations.  The instrument was slowly withdrawn as the mucosa was fully examined.    ESOPHAGUS: The mucosa of the esophagus appeared normal. STOMACH: The mucosa and folds of the stomach appeared normal. DUODENUM: The duodenal mucosa showed no abnormalities in the bulb and second portion of the duodenum.  Retroflexed views revealed no abnormalities.   The scope was then withdrawn from the patient and the procedure completed.  COMPLICATIONS: There were no complications.  ENDOSCOPIC IMPRESSION: 1.   The EGD appeared normal  RECOMMENDATIONS: 1.  No GI explanation for abd pain 2.  Follow up with PCP   eSigned:  Meryl Dare, MD, [facility] 10/07/2012 11:01 AM

## 2012-10-07 NOTE — Op Note (Signed)
[facility] [address] [place], [address]   COLONOSCOPY PROCEDURE REPORT  PATIENT: Carol [name], Carol [name]  MR#: [id number] BIRTHDATE: October 27, 1965 , 46  yrs. old GENDER: Female ENDOSCOPIST: Meryl Dare, MD, [facility] REFERRED AO:ZHYQ Susann Givens, M.D. PROCEDURE DATE:  10/07/2012 PROCEDURE:   Colonoscopy with snare polypectomy ASA CLASS:   Class II INDICATIONS:abdominal pain in the lower left quadrant and abdominal pain in the upper left quadrant. MEDICATIONS: MAC sedation, administered by CRNA and propofol (Diprivan) 300mg  IV DESCRIPTION OF PROCEDURE:   After the risks benefits and alternatives of the procedure were thoroughly explained, informed consent was obtained.  A digital rectal exam revealed no abnormalities of the rectum.   The [facility] [id number]  endoscope was introduced through the anus and advanced to the cecum, which was identified by both the appendix and ileocecal valve. No adverse events experienced.   The quality of the prep was good, using MoviPrep  The instrument was then slowly withdrawn as the colon was fully examined.  COLON FINDINGS: A sessile polyp measuring 6 mm in size was found in the descending colon.  A polypectomy was performed with a cold snare.  The resection was complete and the polyp tissue was completely retrieved.   Ten sessile polyps ranging between 3-22mm in size were found in the sigmoid colon and rectum.  A polypectomy was performed with a cold snare.  The resection was complete and the polyp tissue was completely retrieved.   The colon was otherwise normal.  There was no diverticulosis, inflammation, polyps or cancers unless previously stated.  Retroflexed views revealed no abnormalities. The time to cecum=1 minutes 54 seconds.  Withdrawal time=13 minutes 10 seconds.  The scope was withdrawn and the procedure completed. COMPLICATIONS: There were no complications.  ENDOSCOPIC IMPRESSION: 1.   Sessile polyp measuring 6 mm in the  descending colon; polypectomy performed with a cold snare 2.  Ten sessile polyps ranging between 3-70mm in the sigmoid colon and rectum; polypectomy performed with a cold snare 3.   The colon was otherwise normal  RECOMMENDATIONS: 1.  Await pathology results and to determine appropriate colonoscopy follow up interval 2.  No GI explanation for abd pain   eSigned:  Meryl Dare, MD, [facility] 10/07/2012 10:53 AM   :

## 2012-10-07 NOTE — Progress Notes (Signed)
Called to room to assist during endoscopic procedure.  Patient ID and intended procedure confirmed with present staff. Received instructions for my participation in the procedure from the performing physician.  

## 2012-10-07 NOTE — Patient Instructions (Addendum)
Findings:  Polyps Recommendations:  Follow up with PCP  YOU HAD AN ENDOSCOPIC PROCEDURE TODAY AT THE [facility]: Refer to the procedure report that was given to you for any specific questions about what was found during the examination.  If the procedure report does not answer your questions, please call your gastroenterologist to clarify.  If you requested that your care partner not be given the details of your procedure findings, then the procedure report has been included in a sealed envelope for you to review at your convenience later.  YOU SHOULD EXPECT: Some feelings of bloating in the abdomen. Passage of more gas than usual.  Walking can help get rid of the air that was put into your GI tract during the procedure and reduce the bloating. If you had a lower endoscopy (such as a colonoscopy or flexible sigmoidoscopy) you may notice spotting of blood in your stool or on the toilet paper. If you underwent a bowel prep for your procedure, then you may not have a normal bowel movement for a few days.  DIET: Your first meal following the procedure should be a light meal and then it is ok to progress to your normal diet.  A half-sandwich or bowl of soup is an example of a good first meal.  Heavy or fried foods are harder to digest and may make you feel nauseous or bloated.  Likewise meals heavy in dairy and vegetables can cause extra gas to form and this can also increase the bloating.  Drink plenty of fluids but you should avoid alcoholic beverages for 24 hours.  ACTIVITY: Your care partner should take you home directly after the procedure.  You should plan to take it easy, moving slowly for the rest of the day.  You can resume normal activity the day after the procedure however you should NOT DRIVE or use heavy machinery for 24 hours (because of the sedation medicines used during the test).    SYMPTOMS TO REPORT IMMEDIATELY: A gastroenterologist can be reached at any hour.  During normal  business hours, 8:30 AM to 5:00 PM Monday through Friday, call [phone].  After hours and on weekends, please call the GI answering service at [phone] who will take a message and have the physician on call contact you.   Following lower endoscopy (colonoscopy or flexible sigmoidoscopy):  Excessive amounts of blood in the stool  Significant tenderness or worsening of abdominal pains  Swelling of the abdomen that is new, acute  Fever of 100F or higher  Following upper endoscopy (EGD)  Vomiting of blood or coffee ground material  New chest pain or pain under the shoulder blades  Painful or persistently difficult swallowing  New shortness of breath  Fever of 100F or higher  Black, tarry-looking stools  FOLLOW UP: If any biopsies were taken you will be contacted by phone or by letter within the next 1-3 weeks.  Call your gastroenterologist if you have not heard about the biopsies in 3 weeks.  Our staff will call the home number listed on your records the next business day following your procedure to check on you and address any questions or concerns that you may have at that time regarding the information given to you following your procedure. This is a courtesy call and so if there is no answer at the home number and we have not heard from you through the emergency physician on call, we will assume that you have returned to your regular  daily activities without incident.  SIGNATURES/CONFIDENTIALITY: You and/or your care partner have signed paperwork which will be entered into your electronic medical record.  These signatures attest to the fact that that the information above on your After Visit Summary has been reviewed and is understood.  Full responsibility of the confidentiality of this discharge information lies with you and/or your care-partner.  Please follow all discharge instructions given to you by the recovery room nurse. If you have any questions or problems after  discharge please call one of the numbers listed above. You will receive a phone call in the am to see how you are doing and answer any questions you may have. Thank you for choosing [facility] for your health care needs.

## 2012-10-08 ENCOUNTER — Telehealth: Payer: Self-pay | Admitting: *Deleted

## 2012-10-08 NOTE — Telephone Encounter (Signed)
No answer, name identifier, message left to call if questions or concerns

## 2012-10-13 ENCOUNTER — Encounter: Payer: Self-pay | Admitting: Gastroenterology

## 2013-05-29 ENCOUNTER — Emergency Department (HOSPITAL_COMMUNITY): Payer: BC Managed Care – PPO

## 2013-05-29 ENCOUNTER — Emergency Department (HOSPITAL_COMMUNITY)
Admission: EM | Admit: 2013-05-29 | Discharge: 2013-05-29 | Disposition: A | Discharge status: Home / Self Care | Payer: BC Managed Care – PPO | Attending: Emergency Medicine | Admitting: Emergency Medicine

## 2013-05-29 ENCOUNTER — Encounter (HOSPITAL_COMMUNITY): Payer: Self-pay | Admitting: Emergency Medicine

## 2013-05-29 DIAGNOSIS — I10 Essential (primary) hypertension: Secondary | ICD-10-CM | POA: Insufficient documentation

## 2013-05-29 DIAGNOSIS — Z8709 Personal history of other diseases of the respiratory system: Secondary | ICD-10-CM | POA: Insufficient documentation

## 2013-05-29 DIAGNOSIS — M7660 Achilles tendinitis, unspecified leg: Secondary | ICD-10-CM | POA: Insufficient documentation

## 2013-05-29 DIAGNOSIS — F17200 Nicotine dependence, unspecified, uncomplicated: Secondary | ICD-10-CM | POA: Insufficient documentation

## 2013-05-29 DIAGNOSIS — Z79899 Other long term (current) drug therapy: Secondary | ICD-10-CM | POA: Insufficient documentation

## 2013-05-29 DIAGNOSIS — M7661 Achilles tendinitis, right leg: Secondary | ICD-10-CM

## 2013-05-29 MED ORDER — HYDROCODONE-ACETAMINOPHEN 5-325 MG PO TABS: ORAL_TABLET | ORAL | Status: DC

## 2013-05-29 MED ORDER — NAPROXEN 500 MG PO TABS: 500.0000 mg | ORAL_TABLET | Freq: Two times a day (BID) | ORAL | Status: DC

## 2013-05-29 MED ORDER — IBUPROFEN 800 MG PO TABS
800.0000 mg | ORAL_TABLET | Freq: Once | ORAL | Status: AC
  Administered 2013-05-29: 800 mg via ORAL
  Filled 2013-05-29: qty 1

## 2013-05-29 MED ORDER — HYDROCODONE-ACETAMINOPHEN 5-325 MG PO TABS
1.0000 | ORAL_TABLET | Freq: Once | ORAL | Status: AC
  Administered 2013-05-29: 1 via ORAL
  Filled 2013-05-29: qty 1

## 2013-05-29 NOTE — ED Notes (Signed)
Patient to ED with complaint of right ankle and calf pain, "my calf swole up". Patient states that it started last night with no noted injury at the time, and she awoke at 4AM with an increase of swelling to the calf. The patient states that she has no previous injury to the ankle. Patient states that she has had no changes to her daily routine that could have caused her calf muscle to be over-exerted. Patient reports that she has taken nothing for the pain. Patient describes pain as "throbbing and tightness up my ankle and calf". Alert and oriented X4. Patient in no apparent distress. Limited movement of the right ankle, sensation intact, cap refill WNL, no discoloration, no noticeable swelling.

## 2013-05-29 NOTE — ED Notes (Signed)
Ice pack given to patient. Right ankle elevated. Patient resting in bed.

## 2013-05-29 NOTE — ED Provider Notes (Signed)
CSN: [id number]     Arrival date & time 05/29/13  1811 History   First MD Initiated Contact with Patient 05/29/13 1843    This chart was scribed for Carol [name] Robert, a non-physician practitioner working with Vida Roller, MD by Lewanda Rife, ED Scribe. This patient was seen in room APFT21/APFT21 and the patient's care was started at 6:47 PM     Chief Complaint  Patient presents with  . Ankle Pain   (Consider location/radiation/quality/duration/timing/severity/associated sxs/prior Treatment) The history is provided by the patient. No language interpreter was used.   HPI Comments: Carol [name] is a 47 y.o. female who presents to the Emergency Department complaining of constant, moderate posterior right ankle pain onset last night. Describes pain as throbbing. Reports she is on her feet often and wore a different pair of shoes for 1-2 hrs yesterday. . Reports pain is exacerbated by weight bearing to her heel and rocking her foot. Denies any alleviating factors. Reports associated swelling to site. Reports associated antalgic gait. Denies associated known injury, recent fall, numbness, and fever. Denies previous injury to right ankle. Denies taking any medications to relieve symptoms.  Past Medical History  Diagnosis Date  . History of migraine headaches   . Impaired fasting glucose   . Bronchitis   . Hypertension   . Back pain   . History of blood in urine     microscopic   Past Surgical History  Procedure Laterality Date  . Cholecystectomy    . Abdominal hysterectomy  2002  . Tubal ligation     Family History  Problem Relation Age of Onset  . Arthritis Paternal Grandmother   . Diabetes Paternal Grandmother   . Hypertension Paternal Grandmother   . Stroke Paternal Grandmother   . Colon cancer Neg Hx   . Diabetes Mother    History  Substance Use Topics  . Smoking status: Current Some Day Smoker -- 0.50 packs/day for 20 years    Types: Cigarettes, Cigars  .  Smokeless tobacco: Never Used     Comment: Tobacco info given  . Alcohol Use: No     Comment: occ.   OB History   Grav Para Term Preterm Abortions TAB SAB Ect Mult Living                 Review of Systems  Musculoskeletal:       Right ankle pain    Skin: Negative for wound.  Neurological: Negative for numbness.  All other systems reviewed and are negative.    Allergies  Review of patient's allergies indicates no known allergies.  Home Medications   Current Outpatient Rx  Name  Route  Sig  Dispense  Refill  . telmisartan-hydrochlorothiazide (MICARDIS HCT) 40-12.5 MG per tablet   Oral   Take 1 tablet by mouth daily.            BP 132/88  Pulse 90  Temp(Src) 98.2 F (36.8 C) (Oral)  Resp 16  SpO2 100% Physical Exam  Nursing note and vitals reviewed. Constitutional: She is oriented to person, place, and time. She appears well-developed and well-nourished. No distress.  HENT:  Head: Normocephalic and atraumatic.  Cardiovascular: Normal rate, regular rhythm, normal heart sounds, intact distal pulses and normal pulses.   Pulses:      Dorsalis pedis pulses are 2+ on the right side.       Posterior tibial pulses are 2+ on the right side.  Pulmonary/Chest: Effort normal and breath sounds normal.  No respiratory distress.  Musculoskeletal: Normal range of motion. She exhibits tenderness. She exhibits no edema.       Right ankle: Normal. She exhibits normal range of motion, no swelling, no ecchymosis and no deformity. No tenderness. No lateral malleolus and no medial malleolus tenderness found. Achilles tendon normal. Achilles tendon exhibits no pain.       Right lower leg: Normal. She exhibits no tenderness.       Right foot: She exhibits tenderness. She exhibits normal range of motion, no swelling, normal capillary refill, no crepitus, no deformity and no laceration.       Feet:   Pt gestures to right achilles tendon as area of tenderness, but is non-tender to palpation.   No erythema, no swelling.  No left or right calf tenderness. No right thigh tenderness, DP pulses brisk, distal sensation intact  Neurological: She is alert and oriented to person, place, and time. No sensory deficit. She exhibits normal muscle tone. Coordination and gait normal.  Normal and symmetric sensation to light touch for bilateral lower extremities   Skin: Skin is warm and dry. No rash noted.    ED Course  Procedures (including critical care time)  COORDINATION OF CARE:  Nursing notes reviewed. Vital signs reviewed. Initial pt interview and examination performed.   6:58 PM-Discussed work up plan with pt at bedside, which includes applying ice, and right ankle x-ray. Pt agrees with plan.   Treatment plan initiated:  Ibuprofen 800 mg and vicodin      Labs Review Labs Reviewed - No data to display Imaging Review Dg Ankle Complete Right  05/29/2013   CLINICAL DATA:  Right ankle swelling.  EXAM: RIGHT ANKLE - COMPLETE 3+ VIEW  COMPARISON:  None.  FINDINGS: There is no evidence of fracture, dislocation, or joint effusion. There is no evidence of arthropathy or other focal bone abnormality. Soft tissues are unremarkable.  IMPRESSION: Negative.   Electronically Signed   By: Roque Lias M.D.   On: 05/29/2013 19:05      MDM    aso applied,  Pain improved.  Remains NV intact.  No clinical sx's to suggest DVT.  Pt has tenderness with weight bearing only along the Achilles tendon that began after walking and wearng different shoes.  Likely Achilles tendonitis.  Will treat symptomatically.  Referral for ortho given.  Pt stable for discharge.  Request crutches.    I personally performed the services described in this documentation, which was scribed in my presence. The recorded information has been reviewed and is accurate.     Zakir Henner L. Trisha Mangle, PA-C 05/29/13 2221

## 2013-05-29 NOTE — ED Provider Notes (Signed)
Medical screening examination/treatment/procedure(s) were performed by non-physician practitioner and as supervising physician I was immediately available for consultation/collaboration.    Vida Roller, MD 05/29/13 2251

## 2013-05-29 NOTE — ED Notes (Signed)
Pt c/o right ankle swelling since last night. Denies injury. Has used ace wrap. Nad. Denies sob/other swelling

## 2013-05-30 ENCOUNTER — Telehealth: Payer: Self-pay | Admitting: Family Medicine

## 2013-05-30 NOTE — Telephone Encounter (Signed)
Called pt and reached voice mail, needs follow up ER ov.

## 2013-06-01 ENCOUNTER — Encounter (HOSPITAL_COMMUNITY): Payer: Self-pay | Admitting: Emergency Medicine

## 2013-06-01 ENCOUNTER — Emergency Department (HOSPITAL_COMMUNITY)
Admission: EM | Admit: 2013-06-01 | Discharge: 2013-06-01 | Disposition: A | Discharge status: Home / Self Care | Payer: BC Managed Care – PPO | Attending: Emergency Medicine | Admitting: Emergency Medicine

## 2013-06-01 DIAGNOSIS — F17200 Nicotine dependence, unspecified, uncomplicated: Secondary | ICD-10-CM | POA: Insufficient documentation

## 2013-06-01 DIAGNOSIS — M7661 Achilles tendinitis, right leg: Secondary | ICD-10-CM

## 2013-06-01 DIAGNOSIS — Z8709 Personal history of other diseases of the respiratory system: Secondary | ICD-10-CM | POA: Insufficient documentation

## 2013-06-01 DIAGNOSIS — M7660 Achilles tendinitis, unspecified leg: Secondary | ICD-10-CM | POA: Insufficient documentation

## 2013-06-01 DIAGNOSIS — Z791 Long term (current) use of non-steroidal anti-inflammatories (NSAID): Secondary | ICD-10-CM | POA: Insufficient documentation

## 2013-06-01 DIAGNOSIS — Z87448 Personal history of other diseases of urinary system: Secondary | ICD-10-CM | POA: Insufficient documentation

## 2013-06-01 DIAGNOSIS — I10 Essential (primary) hypertension: Secondary | ICD-10-CM | POA: Insufficient documentation

## 2013-06-01 DIAGNOSIS — Z87828 Personal history of other (healed) physical injury and trauma: Secondary | ICD-10-CM | POA: Insufficient documentation

## 2013-06-01 MED ORDER — OXYCODONE-ACETAMINOPHEN 5-325 MG PO TABS: 1.0000 | ORAL_TABLET | ORAL | Status: DC | PRN

## 2013-06-01 NOTE — ED Notes (Signed)
Pt reports was evaluated here Sunday and was instructed to follow up with orthopedist.  Pt says can't get in to see specialist until Oct 21.  Pt says her original work note said for her to return to work today but patient says doesn't feel like she can return to work yet.

## 2013-06-01 NOTE — ED Provider Notes (Signed)
CSN: [id number]     Arrival date & time 06/01/13  1314 History   First MD Initiated Contact with Patient 06/01/13 1329     Chief Complaint  Patient presents with  . Ankle Pain   (Consider location/radiation/quality/duration/timing/severity/associated sxs/prior Treatment) HPI Comments: Carol [name] is a 47 y.o. Female presenting for additional pain medication and to have her work note extended since she is unable to see her orthopedist until next Tuesday, 6 days from now.  She was seen here 3 days ago at which time she was diagnosed with a right Achilles tendinitis injury and has been using an ASO, rest, elevation, ice hydrocodone and Naprosyn with minimal improvement in her pain symptoms, although she believes the pain is more localized now in her lower Achilles and not radiating as badly into her upper tendon since she has been minimizing use.  She has had to double up her hydrocodone for pain relief.  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     The history is provided by the patient.    Past Medical History  Diagnosis Date  . History of migraine headaches   . Impaired fasting glucose   . Bronchitis   . Hypertension   . Back pain   . History of blood in urine     microscopic   Past Surgical History  Procedure Laterality Date  . Cholecystectomy    . Abdominal hysterectomy  2002  . Tubal ligation     Family History  Problem Relation Age of Onset  . Arthritis Paternal Grandmother   . Diabetes Paternal Grandmother   . Hypertension Paternal Grandmother   . Stroke Paternal Grandmother   . Colon cancer Neg Hx   . Diabetes Mother    History  Substance Use Topics  . Smoking status: Current Some Day Smoker -- 0.50 packs/day for 20 years    Types: Cigarettes, Cigars  . Smokeless tobacco: Never Used     Comment: Tobacco info given  . Alcohol Use: No     Comment: occ.   OB  History   Grav Para Term Preterm Abortions TAB SAB Ect Mult Living                 Review of Systems  Constitutional: Negative for fever.  Musculoskeletal: Positive for arthralgias. Negative for joint swelling and myalgias.  Neurological: Negative for weakness and numbness.    Allergies  Review of patient's allergies indicates no known allergies.  Home Medications   Current Outpatient Rx  Name  Route  Sig  Dispense  Refill  . HYDROcodone-acetaminophen (NORCO/VICODIN) 5-325 MG per tablet   Oral   Take 1 tablet by mouth every 4 (four) hours as needed for pain.         . naproxen (NAPROSYN) 500 MG tablet   Oral   Take 1 tablet (500 mg total) by mouth 2 (two) times daily.   20 tablet   0   . oxyCODONE-acetaminophen (PERCOCET/ROXICET) 5-325 MG per tablet   Oral   Take 1 tablet by mouth every 4 (four) hours as needed for pain.   20 tablet   0    BP 150/72  Pulse 82  Temp(Src) 98.2 F (36.8 C) (Oral)  Resp 20  Ht 5\' 7"  (1.702 m)  Wt 140 lb (63.504 kg)  BMI 21.92 kg/m2  SpO2 100% Physical Exam  Constitutional: She appears  well-developed and well-nourished.  HENT:  Head: Atraumatic.  Neck: Normal range of motion.  Cardiovascular:  Pulses equal bilaterally  Musculoskeletal: She exhibits tenderness. She exhibits no edema.       Feet:  Point tender at right distal Achilles insertion.  There is no edema, no calf tenderness.  Dorsalis pedis pulses full.  She has full range of motion of her toes, increased pain with attempts at ankle flexion.  She presents wearing her ASO.  Neurological: She is alert. She has normal strength. She displays normal reflexes. No sensory deficit.  Equal strength  Skin: Skin is warm and dry.  Psychiatric: She has a normal mood and affect.    ED Course  Procedures (including critical care time) Labs Review Labs Reviewed - No data to display Imaging Review No results found.  EKG Interpretation   None       MDM   1. Achilles  tendinitis, right    Encouraged continued treatment with the ASO, crutches to avoid weightbearing.  She was prescribed oxycodone.  Work note given for the next 3 evenings.  She states she will try to return to work on Sunday.  She also plans to see orthopedics next Tuesday as already scheduled.    X-rays from prior visit were reviewed.  No indication for further testing at today's visit.    Burgess Amor, PA-C 06/01/13 1719

## 2013-06-02 ENCOUNTER — Telehealth: Payer: Self-pay | Admitting: Family Medicine

## 2013-06-02 NOTE — ED Provider Notes (Signed)
Medical screening examination/treatment/procedure(s) were performed by non-physician practitioner and as supervising physician I was immediately available for consultation/collaboration.   Junah Yam L Teneka Malmberg, MD 06/02/13 1503 

## 2013-06-02 NOTE — Telephone Encounter (Signed)
Sent letter to pt advised call us before going to ER for non emergency issues.

## 2013-06-07 ENCOUNTER — Encounter: Payer: Self-pay | Admitting: Orthopedic Surgery

## 2013-06-07 ENCOUNTER — Ambulatory Visit (INDEPENDENT_AMBULATORY_CARE_PROVIDER_SITE_OTHER): Payer: BC Managed Care – PPO | Admitting: Orthopedic Surgery

## 2013-06-07 VITALS — BP 139/83 | Ht 68.0 in | Wt 171.0 lb

## 2013-06-07 DIAGNOSIS — M7660 Achilles tendinitis, unspecified leg: Secondary | ICD-10-CM

## 2013-06-07 NOTE — Progress Notes (Signed)
Patient ID: Carol [name], female   DOB: 13-May-1966, 47 y.o.   MRN: [id number]  Chief Complaint  Patient presents with  . Leg Pain    Right Achilles Tendinitis    New patient new problem  47 years old she reports acute onset of pain in the Achilles tendon of her right foot and ankle which required emergency room visit on October 12. Her pain is at a 10 constant worse with walking standing and is associated with swelling and some numbness.  She reports a history of chills eye pain snoring blood in the stool currently okay blood in the urine joint pain and swelling muscle pain skin changes  Review of systems otherwise normal  Medical history status post tubal ligation hysterectomy history of bronchitis  Family history of arthritis and diabetes  She is a Merchandiser, retail stands all day does report smoking history  Blood pressure 139/83, height 5\' 8"  (1.727 m), weight 171 lb (77.565 kg). She is well-developed well-nourished female grooming and hygiene are intact she is oriented x3 mood and affect are normal  She is tenderness in the Achilles tendon no swelling no nodularity painful dorsiflexion of the foot is noted Achilles tendon is intact ankle stability is normal motor exam is normal as well scans intact without rash or redness distal neurovascular function is normal  When she walks she does walk with a limp  Diagnosis Achilles tendinitis  Plan Cam Walker for 6 weeks Work if tolerated by her employer Stop Percocet and naproxen Start Norco and prednisone Return 2 weeks to reassess

## 2013-06-07 NOTE — Patient Instructions (Addendum)
Ice bath 3 x a day  Wear brace all the time except sleep and bathing  Return to work in brace   Stop naprosyn and percocet  Start prednisone and norco

## 2013-06-23 ENCOUNTER — Ambulatory Visit (INDEPENDENT_AMBULATORY_CARE_PROVIDER_SITE_OTHER): Payer: BC Managed Care – PPO | Admitting: Orthopedic Surgery

## 2013-06-23 ENCOUNTER — Encounter: Payer: Self-pay | Admitting: Orthopedic Surgery

## 2013-06-23 VITALS — BP 147/92 | Ht 68.0 in | Wt 171.0 lb

## 2013-06-23 DIAGNOSIS — M7660 Achilles tendinitis, unspecified leg: Secondary | ICD-10-CM

## 2013-06-23 NOTE — Progress Notes (Signed)
Patient ID: Carol [name], female   DOB: 1965/09/07, 47 y.o.   MRN: [id number]  Chief Complaint  Patient presents with  . Follow-up    2 week recheck on right ankle.    BP 147/92  Ht 5\' 8"  (1.727 m)  Wt 171 lb (77.565 kg)  BMI 26.01 kg/m2  Encounter Diagnosis  Name Primary?  . Achilles bursitis or tendinitis Yes   Followup Achilles tendinitis right ankle treated with Cam Walker, naproxen and hydrocodone. She's been in a boot for 2 weeks with improvement in terms of decreased pain.  She is having some left shin pain and right leg pain which I believe secondary to increased low transfer and alternate pelvic tilt.  Recommend wearing higher he'll height on the left She was able to continue working. Since she is able to work and she's doing better I would recommend a full 6 weeks of Cam Walker treatment including naproxen   General appearance is normal, the patient is alert and oriented x3 with normal mood and affect. The tendon swelling is decreased she is less tender her range of motion is normal her ankle is stable neurovascular exam intact  She'll followup in 4 weeks

## 2013-06-23 NOTE — Patient Instructions (Signed)
Continue boot for 4 weeks

## 2013-06-28 ENCOUNTER — Ambulatory Visit: Payer: BC Managed Care – PPO | Admitting: Orthopedic Surgery

## 2013-07-04 ENCOUNTER — Encounter: Payer: Self-pay | Admitting: Medical

## 2013-07-04 ENCOUNTER — Ambulatory Visit (INDEPENDENT_AMBULATORY_CARE_PROVIDER_SITE_OTHER): Payer: BC Managed Care – PPO | Admitting: Medical

## 2013-07-04 VITALS — BP 130/80 | HR 92 | Temp 98.2°F | Resp 16 | Wt 170.0 lb

## 2013-07-04 DIAGNOSIS — J039 Acute tonsillitis, unspecified: Secondary | ICD-10-CM

## 2013-07-04 DIAGNOSIS — M542 Cervicalgia: Secondary | ICD-10-CM

## 2013-07-04 DIAGNOSIS — L049 Acute lymphadenitis, unspecified: Secondary | ICD-10-CM

## 2013-07-04 MED ORDER — AMOXICILLIN 250 MG/5ML PO SUSR: ORAL | Status: DC

## 2013-07-04 MED ORDER — IBUPROFEN 800 MG PO TABS: 800.0000 mg | ORAL_TABLET | Freq: Three times a day (TID) | ORAL | Status: DC | PRN

## 2013-07-04 NOTE — Progress Notes (Signed)
Subjective: Here for 1 day hx/o swelling of the right face and neck. She notes having right-sided ear and tooth pain last week, but this improved. Her voice has been a little muffled, she has felt feverish, and the right neck and face no swelling yesterday. She had sweats yesterday, some body aches but no chills. No sick contacts. Denies nausea, vomiting, sinus pressure, sore throat, chest congestion or head congestion.  No other aggravating or relieving factors. No other complaints.  Objective: Filed Vitals:   07/04/13 1429  BP: 130/80  Pulse: 92  Temp: 98.2 F (36.8 C)  Resp: 16    General appearance: alert, no distress, WD/WN, ill appearing HEENT: normocephalic, sclerae anicteric, no sinus tenderness, TMs pearly, nares with swollen turbinated, no discharge or erythema, pharynx with erythema, right sided tonsils mildly enlarged and with erythema, no exudate Oral cavity: MMM, no lesions Neck: supple, tender and slightly swollen right neck, tender small shoddy nodes,  no thyromegaly, no masses Heart: RRR, normal S1, S2, no murmurs Lungs: CTA bilaterally, no wheezes, rhonchi, or rales   Assessment: Encounter Diagnoses  Name Primary?  . Neck pain on right side Yes  . Lymphadenitis, acute   . Acute tonsillitis     Plan: Begin amoxicillin, ibuprofen, rest, hydrate well.  If worse symptoms, worse muffled voice, fever, return immediately. Otherwise, return when necessary or if not completely back to normal in 1 wk.  Work note given for yesterday and today.  Follow-up 3-4 days if worse or not improving.

## 2013-07-21 ENCOUNTER — Ambulatory Visit (INDEPENDENT_AMBULATORY_CARE_PROVIDER_SITE_OTHER): Payer: BC Managed Care – PPO | Admitting: Orthopedic Surgery

## 2013-07-21 VITALS — Ht 68.0 in | Wt 171.0 lb

## 2013-07-21 DIAGNOSIS — M7660 Achilles tendinitis, unspecified leg: Secondary | ICD-10-CM

## 2013-07-21 NOTE — Patient Instructions (Signed)
Continue cam walker   You have received a steroid shot. 15% of patients experience increased pain at the injection site with in the next 24 hours. This is best treated with ice and tylenol extra strength 2 tabs every 8 hours. If you are still having pain please call the office.

## 2013-07-24 NOTE — Progress Notes (Signed)
Patient ID: Carol [name], female   DOB: 1965/11/12, 47 y.o.   MRN: [id number] Chief Complaint  Patient presents with  . Follow-up    4 week recheck on right ankle achilles.   Ht 5\' 8"  (1.727 m)  Wt 171 lb (77.565 kg)  BMI 26.01 kg/m2 No diagnosis found.  STILL HAVING PAIN BUT MODERATE IMPROVEMENT WITH BOOT  ROS NORMAL   PE  Ht 5\' 8"  (1.727 m)  Wt 171 lb (77.565 kg)  BMI 26.01 kg/m2 General appearance is normal, the patient is alert and oriented x3 with normal mood and affect. NORMAL ROM BUT TENDER ACHILLES   REC INJECTION AND CONTINUE BOOT WEAR   ACHILLES TENDON INJECTION  Procedure Note  Pre-operative Diagnosis: ACHILLES TENDONITIS -RIGHT  Post-operative Diagnosis: same  Indications: PAIN SWELLING  Anesthesia: ethyl chloride   Procedure Details   Verbal consent was obtained for the procedure. THE RIGHT RETROCALCANEAL BURSA AREA WAS PREPPED WITH alcohol and ethyl chloride and then injected with Depo-Medrol 40 mg and 3 cc of lidocaine 1%.   Complications:  None; patient tolerated the procedure well.

## 2013-07-25 ENCOUNTER — Telehealth: Payer: Self-pay | Admitting: *Deleted

## 2013-07-25 ENCOUNTER — Other Ambulatory Visit: Payer: Self-pay | Admitting: *Deleted

## 2013-07-25 DIAGNOSIS — L853 Xerosis cutis: Secondary | ICD-10-CM

## 2013-07-25 NOTE — Telephone Encounter (Signed)
Patient has an appointment with Dr. Margo Aye 08/01/13 at 9:30 am. Left message of appointment on voicemail.

## 2013-08-25 ENCOUNTER — Ambulatory Visit (INDEPENDENT_AMBULATORY_CARE_PROVIDER_SITE_OTHER): Payer: BC Managed Care – PPO | Admitting: Orthopedic Surgery

## 2013-08-25 VITALS — BP 115/87 | Ht 68.0 in | Wt 171.0 lb

## 2013-08-25 DIAGNOSIS — M7661 Achilles tendinitis, right leg: Secondary | ICD-10-CM

## 2013-08-25 DIAGNOSIS — M7660 Achilles tendinitis, unspecified leg: Secondary | ICD-10-CM

## 2013-08-25 MED ORDER — METHOCARBAMOL 500 MG PO TABS: 500.0000 mg | ORAL_TABLET | Freq: Three times a day (TID) | ORAL | Status: DC

## 2013-08-25 MED ORDER — IBUPROFEN 800 MG PO TABS: 800.0000 mg | ORAL_TABLET | Freq: Three times a day (TID) | ORAL | Status: DC | PRN

## 2013-08-25 MED ORDER — HYDROCODONE-ACETAMINOPHEN 5-325 MG PO TABS: 1.0000 | ORAL_TABLET | ORAL | Status: DC | PRN

## 2013-08-25 NOTE — Patient Instructions (Signed)
Pick up other 2 prescriptions at pharmacy

## 2013-08-26 DIAGNOSIS — M7661 Achilles tendinitis, right leg: Secondary | ICD-10-CM | POA: Insufficient documentation

## 2013-08-26 NOTE — Progress Notes (Signed)
Patient ID: Carol [name], female   DOB: [birth year], 48 y.o.   MRN: [id number]  RECHECK   48 years old she reports acute onset of pain in the Achilles tendon of her right foot and ankle which required emergency room visit on October 12. Her pain is at a 10 constant worse with walking standing and is associated with swelling and some numbness.  TREATMENT  CAM WALKER  STEROID INJECTION  COMPLAINS OF PAIN ACHILLES WITH IMPROVEMENT AFTER INJECTION  ROS NEURO NORMAL   PHYSICAL EXAM:  BP 115/87  Ht 5\' 8"  (1.727 m)  Wt 77.565 kg (171 lb)  BMI 26.01 kg/m2 General appearance is normal, the patient is alert and oriented x3 with normal mood and affect. ACHILLES TENDERNESS STRENGTH NORMAL ROM IS NORMAL   REPEAT INJECTION   Procedure Achilles injection  Preprocedure diagnosis Achilles tendinitis and tendinosis  Post procedure diagnosis same  Consent verbal  Timeout completed  Medications: Alcohol and ethyl chloride  Details the right Achilles was prepped with alcohol and ethyl chloride and then 40 mg of Depo-Medrol was used to inject the retrocalcaneal bursa  This was well-tolerated without complication

## 2013-09-11 ENCOUNTER — Encounter (HOSPITAL_COMMUNITY): Payer: Self-pay | Admitting: Emergency Medicine

## 2013-09-11 ENCOUNTER — Emergency Department (HOSPITAL_COMMUNITY)
Admission: EM | Admit: 2013-09-11 | Discharge: 2013-09-12 | Disposition: A | Discharge status: Home / Self Care | Payer: BC Managed Care – PPO | Attending: Emergency Medicine | Admitting: Emergency Medicine

## 2013-09-11 ENCOUNTER — Emergency Department (HOSPITAL_COMMUNITY): Payer: BC Managed Care – PPO

## 2013-09-11 DIAGNOSIS — F17200 Nicotine dependence, unspecified, uncomplicated: Secondary | ICD-10-CM | POA: Insufficient documentation

## 2013-09-11 DIAGNOSIS — R112 Nausea with vomiting, unspecified: Secondary | ICD-10-CM | POA: Insufficient documentation

## 2013-09-11 DIAGNOSIS — R109 Unspecified abdominal pain: Secondary | ICD-10-CM | POA: Insufficient documentation

## 2013-09-11 DIAGNOSIS — M549 Dorsalgia, unspecified: Secondary | ICD-10-CM | POA: Insufficient documentation

## 2013-09-11 DIAGNOSIS — Z9089 Acquired absence of other organs: Secondary | ICD-10-CM | POA: Insufficient documentation

## 2013-09-11 DIAGNOSIS — I10 Essential (primary) hypertension: Secondary | ICD-10-CM | POA: Insufficient documentation

## 2013-09-11 DIAGNOSIS — Z90710 Acquired absence of both cervix and uterus: Secondary | ICD-10-CM | POA: Insufficient documentation

## 2013-09-11 DIAGNOSIS — E669 Obesity, unspecified: Secondary | ICD-10-CM | POA: Insufficient documentation

## 2013-09-11 LAB — URINALYSIS, ROUTINE W REFLEX MICROSCOPIC
Bilirubin Urine: NEGATIVE
Glucose, UA: NEGATIVE mg/dL
KETONES UR: NEGATIVE mg/dL
LEUKOCYTES UA: NEGATIVE
Nitrite: NEGATIVE
PH: 6.5 (ref 5.0–8.0)
PROTEIN: NEGATIVE mg/dL
Specific Gravity, Urine: 1.015 (ref 1.005–1.030)
UROBILINOGEN UA: 1 mg/dL (ref 0.0–1.0)

## 2013-09-11 LAB — CBC WITH DIFFERENTIAL/PLATELET
BASOS PCT: 0 % (ref 0–1)
Basophils Absolute: 0 10*3/uL (ref 0.0–0.1)
Eosinophils Absolute: 0.1 10*3/uL (ref 0.0–0.7)
Eosinophils Relative: 1 % (ref 0–5)
HEMATOCRIT: 37.2 % (ref 36.0–46.0)
HEMOGLOBIN: 12.9 g/dL (ref 12.0–15.0)
Lymphocytes Relative: 30 % (ref 12–46)
Lymphs Abs: 3.1 10*3/uL (ref 0.7–4.0)
MCH: 29.5 pg (ref 26.0–34.0)
MCHC: 34.7 g/dL (ref 30.0–36.0)
MCV: 84.9 fL (ref 78.0–100.0)
MONO ABS: 0.6 10*3/uL (ref 0.1–1.0)
Monocytes Relative: 6 % (ref 3–12)
NEUTROS ABS: 6.4 10*3/uL (ref 1.7–7.7)
Neutrophils Relative %: 63 % (ref 43–77)
Platelets: 408 10*3/uL — ABNORMAL HIGH (ref 150–400)
RBC: 4.38 MIL/uL (ref 3.87–5.11)
RDW: 12.9 % (ref 11.5–15.5)
WBC: 10.2 10*3/uL (ref 4.0–10.5)

## 2013-09-11 LAB — COMPREHENSIVE METABOLIC PANEL
ALBUMIN: 3.7 g/dL (ref 3.5–5.2)
ALT: 28 U/L (ref 0–35)
AST: 23 U/L (ref 0–37)
Alkaline Phosphatase: 91 U/L (ref 39–117)
BUN: 10 mg/dL (ref 6–23)
CO2: 27 mEq/L (ref 19–32)
CREATININE: 0.74 mg/dL (ref 0.50–1.10)
Calcium: 8.9 mg/dL (ref 8.4–10.5)
Chloride: 103 mEq/L (ref 96–112)
GFR calc Af Amer: >90 mL/min (ref >90)
Glucose, Bld: 105 mg/dL — ABNORMAL HIGH (ref 70–99)
Potassium: 3.9 mEq/L (ref 3.7–5.3)
Sodium: 142 mEq/L (ref 137–147)
Total Bilirubin: <0.2 mg/dL — ABNORMAL LOW (ref 0.3–1.2)
Total Protein: 7.9 g/dL (ref 6.0–8.3)

## 2013-09-11 LAB — LIPASE, BLOOD: Lipase: 31 U/L (ref 11–59)

## 2013-09-11 LAB — URINE MICROSCOPIC-ADD ON

## 2013-09-11 MED ORDER — MORPHINE SULFATE 4 MG/ML IJ SOLN
4.0000 mg | Freq: Once | INTRAMUSCULAR | Status: AC
  Administered 2013-09-11: 4 mg via INTRAVENOUS
  Filled 2013-09-11: qty 1

## 2013-09-11 MED ORDER — ONDANSETRON HCL 4 MG/2ML IJ SOLN
4.0000 mg | Freq: Once | INTRAMUSCULAR | Status: AC
  Administered 2013-09-11: 4 mg via INTRAVENOUS
  Filled 2013-09-11: qty 2

## 2013-09-11 NOTE — ED Notes (Signed)
Pt arrived to the ED with a complaint of abdominal pain.  Pt states that the pain is located in the right lower pelvic area.  Pt states she has had some throat issues for three weeks.  Pt states she threw up earlier today which irritated her throat.  Pt took oxycodone for her abdominal pain without relief.  Pt also is complaining of generalized body aches.

## 2013-09-11 NOTE — ED Notes (Signed)
Initial Contact - pt to RM6 with c/o RLQ abd pain, 8/10, +nausea, pt appears uncomfortable, self repositioning for comfort.  Reports onset last night, worsening, unable to tolerate PO.  Pain worse with position changes and palpations.  Abd soft, profoundly TTP RLQ, distended.  Pt reports normal BM yesterday and denies dysuria.  No active vomiting.  NAD.  Changed to hospital gown, awaiting EDP eval.

## 2013-09-11 NOTE — ED Provider Notes (Signed)
CSN: [id number]     Arrival date & time 09/11/13  2027 History  This chart was scribed for non-physician practitioner Hazel Sams, working with No att. providers found by Donato Schultz, ED Scribe. This patient was seen in room WA06/WA06 and the patient's care was started at 11:03 PM.    Chief Complaint  Patient presents with  . Abdominal Pain    The history is provided by the patient. No language interpreter was used.   HPI Comments: Carol [name] is a 48 y.o. female who presents to the Emergency Department complaining of constant suprapubic, sharp abdominal pain that started last night.  The patient states that she has taken Hydrocodone and was able to sleep.  She states that when she woke up, the pain returned and she vomited with yellow emesis.  She states that she tried eating at 3 PM today and was unable to keep her food down.  She lists nausea and back pain as associated symptoms.  She denies difficulty urinating, hematuria, and constipation as an associated symptom.  She denies experiencing this pain before.  She states that she has a history of gallstones and has a history of cholecystectomy.  She states that she still has her appendix.  The patient states that she has a history of hysterectomy.  She denies having any other medical problems.    Past Medical History  Diagnosis Date  . History of migraine headaches   . Impaired fasting glucose   . Bronchitis   . Hypertension   . Back pain   . History of blood in urine     microscopic   Past Surgical History  Procedure Laterality Date  . Cholecystectomy    . Abdominal hysterectomy  2002  . Tubal ligation     Family History  Problem Relation Age of Onset  . Arthritis Paternal Grandmother   . Diabetes Paternal Grandmother   . Hypertension Paternal Grandmother   . Stroke Paternal Grandmother   . Colon cancer Neg Hx   . Diabetes Mother    History  Substance Use Topics  . Smoking status: Current Some Day Smoker -- 0.50  packs/day for 20 years    Types: Cigarettes, Cigars  . Smokeless tobacco: Never Used     Comment: Tobacco info given  . Alcohol Use: No     Comment: occ.   OB History   Grav Para Term Preterm Abortions TAB SAB Ect Mult Living                 Review of Systems  Gastrointestinal: Positive for nausea, vomiting and abdominal pain. Negative for constipation.  Genitourinary: Negative for hematuria and difficulty urinating.  Musculoskeletal: Positive for back pain.  All other systems reviewed and are negative.    Allergies  Review of patient's allergies indicates no known allergies.  Home Medications   Current Outpatient Rx  Name  Route  Sig  Dispense  Refill  . amoxicillin (AMOXIL) 250 MG/5ML suspension      2 tsp TID   300 mL   0   . HYDROcodone-acetaminophen (NORCO/VICODIN) 5-325 MG per tablet   Oral   Take 1 tablet by mouth every 4 (four) hours as needed.   60 tablet   0   . ibuprofen (ADVIL,MOTRIN) 800 MG tablet   Oral   Take 1 tablet (800 mg total) by mouth every 8 (eight) hours as needed.   60 tablet   5   . methocarbamol (ROBAXIN) 500 MG tablet  Oral   Take 1 tablet (500 mg total) by mouth 3 (three) times daily.   60 tablet   1    Triage Vitals: BP 122/58  Pulse 86  Temp(Src) 98.4 F (36.9 C) (Oral)  Resp 16  Ht 5\' 7"  (1.702 m)  Wt 180 lb (81.647 kg)  BMI 28.19 kg/m2  SpO2 99%  Physical Exam  Nursing note and vitals reviewed. Constitutional: She is oriented to person, place, and time. She appears well-developed and well-nourished. No distress.  HENT:  Head: Normocephalic and atraumatic.  Eyes: EOM are normal.  Neck: Neck supple. No tracheal deviation present.  Cardiovascular: Normal rate.   Pulmonary/Chest: Effort normal. No respiratory distress. She has no wheezes.  Abdominal: Soft. There is no hepatosplenomegaly. There is tenderness (right sided) in the right upper quadrant and right lower quadrant. There is CVA tenderness. There is no  rebound, no guarding, no tenderness at McBurney's point and negative Murphy's sign.  Obese. Mild right CVA tenderness.  Musculoskeletal: Normal range of motion.  Neurological: She is alert and oriented to person, place, and time.  Skin: Skin is warm and dry.  Psychiatric: She has a normal mood and affect. Her behavior is normal.    ED Course  Procedures   DIAGNOSTIC STUDIES: Oxygen Saturation is 99% on room air, normal by my interpretation.    COORDINATION OF CARE: 11:07 PM- Discussed administering pain and nausea medication in the ED.  Discussed obtaining a CAT scan of the patient abdomen to rule out appendicitis.  The patient agreed to the treatment plan.    Patient reports feeling much better after medications. Her lab testing and CT scan have not shown any signs of a concerning her emerging cause of her pain. Abdomen is soft with still some mild pains in the right side. At this time we'll plan to have patient return home with symptomatic treatment for pain and nausea with the structures for recheck later in the day. Strict return precautions given. Patient agrees with plan.  Results for orders placed during the hospital encounter of 09/11/13  CBC WITH DIFFERENTIAL      Result Value Range   WBC 10.2  4.0 - 10.5 K/uL   RBC 4.38  3.87 - 5.11 MIL/uL   Hemoglobin 12.9  12.0 - 15.0 g/dL   HCT 37.2  36.0 - 46.0 %   MCV 84.9  78.0 - 100.0 fL   MCH 29.5  26.0 - 34.0 pg   MCHC 34.7  30.0 - 36.0 g/dL   RDW 12.9  11.5 - 15.5 %   Platelets 408 (*) 150 - 400 K/uL   Neutrophils Relative % 63  43 - 77 %   Neutro Abs 6.4  1.7 - 7.7 K/uL   Lymphocytes Relative 30  12 - 46 %   Lymphs Abs 3.1  0.7 - 4.0 K/uL   Monocytes Relative 6  3 - 12 %   Monocytes Absolute 0.6  0.1 - 1.0 K/uL   Eosinophils Relative 1  0 - 5 %   Eosinophils Absolute 0.1  0.0 - 0.7 K/uL   Basophils Relative 0  0 - 1 %   Basophils Absolute 0.0  0.0 - 0.1 K/uL  COMPREHENSIVE METABOLIC PANEL      Result Value Range    Sodium 142  137 - 147 mEq/L   Potassium 3.9  3.7 - 5.3 mEq/L   Chloride 103  96 - 112 mEq/L   CO2 27  19 - 32 mEq/L  Glucose, Bld 105 (*) 70 - 99 mg/dL   BUN 10  6 - 23 mg/dL   Creatinine, Ser 0.74  0.50 - 1.10 mg/dL   Calcium 8.9  8.4 - 10.5 mg/dL   Total Protein 7.9  6.0 - 8.3 g/dL   Albumin 3.7  3.5 - 5.2 g/dL   AST 23  0 - 37 U/L   ALT 28  0 - 35 U/L   Alkaline Phosphatase 91  39 - 117 U/L   Total Bilirubin <0.2 (*) 0.3 - 1.2 mg/dL   GFR calc non Af Amer >90  >90 mL/min   GFR calc Af Amer >90  >90 mL/min  LIPASE, BLOOD      Result Value Range   Lipase 31  11 - 59 U/L  URINALYSIS, ROUTINE W REFLEX MICROSCOPIC      Result Value Range   Color, Urine YELLOW  YELLOW   APPearance CLOUDY (*) CLEAR   Specific Gravity, Urine 1.015  1.005 - 1.030   pH 6.5  5.0 - 8.0   Glucose, UA NEGATIVE  NEGATIVE mg/dL   Hgb urine dipstick MODERATE (*) NEGATIVE   Bilirubin Urine NEGATIVE  NEGATIVE   Ketones, ur NEGATIVE  NEGATIVE mg/dL   Protein, ur NEGATIVE  NEGATIVE mg/dL   Urobilinogen, UA 1.0  0.0 - 1.0 mg/dL   Nitrite NEGATIVE  NEGATIVE   Leukocytes, UA NEGATIVE  NEGATIVE  URINE MICROSCOPIC-ADD ON      Result Value Range   Squamous Epithelial / LPF MANY (*) RARE   WBC, UA 0-2  <3 WBC/hpf   RBC / HPF 3-6  <3 RBC/hpf   Bacteria, UA RARE  RARE     Imaging Review Ct Abdomen Pelvis Wo Contrast  09/11/2013   CLINICAL DATA:  Right flank pain, pelvic pain, nausea, prior cholecystectomy and hysterectomy  EXAM: CT ABDOMEN AND PELVIS WITHOUT CONTRAST  TECHNIQUE: Multidetector CT imaging of the abdomen and pelvis was performed following the standard protocol without IV contrast.  COMPARISON:  07/05/2012  FINDINGS: Minor left base scarring/ atelectasis. No lower lobe pneumonia. No pericardial or pleural effusion.  Abdomen: Prior cholecystectomy noted. Liver, biliary system, pancreas, spleen, and adrenal glands are within normal limits for noncontrast study and demonstrate no acute process.  Kidneys  demonstrate no acute obstruction, hydronephrosis, perinephric inflammatory process, hydroureter, or obstructing ureteral calculus on either side.  Minor atherosclerotic change of the aorta without aneurysm.  No abdominal free fluid, fluid collection, hemorrhage, abscess, or adenopathy.  Normal appendix demonstrated.  Negative for bowel obstruction, dilatation, ileus, or free air. Scattered colonic diverticulosis.  Pelvis: Prior hysterectomy noted. Urinary bladder is collapsed. Pelvic calcifications consistent with venous phleboliths. No pelvic free fluid, fluid collection, hemorrhage, abscess, adenopathy, inguinal abnormality, or hernia. No acute distal bowel process.  Diffuse Degenerative changes of the spine.  IMPRESSION: No acute obstructing ureteral calculus. Negative for hydronephrosis or acute obstructive uropathy on either side  Prior cholecystectomy and hysterectomy  Normal appendix  No acute intra-abdominal or pelvic finding by noncontrast CT   Electronically Signed   By: Daryll Brod M.D.   On: 09/11/2013 23:49      MDM   1. Abdominal pain      I personally performed the services described in this documentation, which was scribed in my presence. The recorded information has been reviewed and is accurate.    Martie Lee, PA-C 09/12/13 [phone]

## 2013-09-11 NOTE — ED Notes (Signed)
Patient transported to CT 

## 2013-09-12 MED ORDER — ONDANSETRON 8 MG PO TBDP: ORAL_TABLET | ORAL | Status: DC

## 2013-09-12 MED ORDER — HYDROCODONE-ACETAMINOPHEN 5-325 MG PO TABS: 1.0000 | ORAL_TABLET | ORAL | Status: DC | PRN

## 2013-09-12 NOTE — Discharge Instructions (Signed)
Your lab tests, urine studies and CT scan of your abdomen have not shown any signs for a concerning or emergent cause of your lower abdominal pains. Please followup with your primary care provider or return for reevaluation of your symptoms tomorrow if they are not improving. Return sooner for any changing or worsening symptoms.    Abdominal Pain, Adult Many things can cause abdominal pain. Usually, abdominal pain is not caused by a disease and will improve without treatment. It can often be observed and treated at home. Your health care provider will do a physical exam and possibly order blood tests and X-rays to help determine the seriousness of your pain. However, in many cases, more time must pass before a clear cause of the pain can be found. Before that point, your health care provider may not know if you need more testing or further treatment. HOME CARE INSTRUCTIONS  Monitor your abdominal pain for any changes. The following actions may help to alleviate any discomfort you are experiencing:  Only take over-the-counter or prescription medicines as directed by your health care provider.  Do not take laxatives unless directed to do so by your health care provider.  Try a clear liquid diet (broth, tea, or water) as directed by your health care provider. Slowly move to a bland diet as tolerated. SEEK MEDICAL CARE IF:  You have unexplained abdominal pain.  You have abdominal pain associated with nausea or diarrhea.  You have pain when you urinate or have a bowel movement.  You experience abdominal pain that wakes you in the night.  You have abdominal pain that is worsened or improved by eating food.  You have abdominal pain that is worsened with eating fatty foods. SEEK IMMEDIATE MEDICAL CARE IF:   Your pain does not go away within 2 hours.  You have a fever.  You keep throwing up (vomiting).  Your pain is felt only in portions of the abdomen, such as the right side or the left  lower portion of the abdomen.  You pass bloody or black tarry stools. MAKE SURE YOU:  Understand these instructions.   Will watch your condition.   Will get help right away if you are not doing well or get worse.  Document Released: 05/14/2005 Document Revised: 05/25/2013 Document Reviewed: 04/13/2013 [facility] Patient Information 2014 [place].

## 2013-09-12 NOTE — ED Provider Notes (Signed)
Medical screening examination/treatment/procedure(s) were performed by non-physician practitioner and as supervising physician I was immediately available for consultation/collaboration.  Teressa Lower, MD 09/12/13 [phone]

## 2013-09-22 ENCOUNTER — Ambulatory Visit (INDEPENDENT_AMBULATORY_CARE_PROVIDER_SITE_OTHER): Payer: BC Managed Care – PPO | Admitting: Orthopedic Surgery

## 2013-09-22 ENCOUNTER — Encounter: Payer: Self-pay | Admitting: Orthopedic Surgery

## 2013-09-22 VITALS — BP 121/78 | Ht 68.0 in | Wt 171.0 lb

## 2013-09-22 DIAGNOSIS — M7660 Achilles tendinitis, unspecified leg: Secondary | ICD-10-CM

## 2013-09-22 NOTE — Progress Notes (Signed)
Patient ID: Carol [name], female   DOB: 11-25-1965, 48 y.o.   MRN: [id number] Chief Complaint  Patient presents with  . Follow-up    4 week recheck right achilles s/p injection    HISTORY: The patient presents with atraumatic onset of pain in the posterior aspect of her heel consistent with Achilles tendinitis. X-rays were negative for fracture. She was treated with a Cam Walker to steroid injections anti-inflammatories orally and hydrocodone for pain as well as physical therapy at home (patient works and cannot go to therapy). She continues to have posterior heel pain aching and difficulty ambulating.  System review negative  Exam vital signs BP 121/78  Ht 5\' 8"  (1.727 m)  Wt 171 lb (77.565 kg)  BMI 26.01 kg/m2 General appearance is normal, the patient is alert and oriented x3 with normal mood and affect.  Tenderness in the Achilles swelling tender Achilles itself painful passive range of motion in dorsiflexion ankle stable  Recommend MRI to assess the tendon for possible debridement pump but excision M. percent to me  Continue home exercises current medications brace followup after MRI

## 2013-09-22 NOTE — Patient Instructions (Signed)
Achilles Tendinitis  with Rehab Achilles tendinitis is a disorder of the Achilles tendon. The Achilles tendon connects the large calf muscles (Gastrocnemius and Soleus) to the heel bone (calcaneus). This tendon is sometimes called the heel cord. It is important for pushing-off and standing on your toes and is important for walking, running, or jumping. Tendinitis is often caused by overuse and repetitive microtrauma. SYMPTOMS  Pain, tenderness, swelling, warmth, and redness may occur over the Achilles tendon even at rest.  Pain with pushing off, or flexing or extending the ankle.  Pain that is worsened after or during activity. CAUSES   Overuse sometimes seen with rapid increase in exercise programs or in sports requiring running and jumping.  Poor physical conditioning (strength and flexibility or endurance).  Running sports, especially training running down hills.  Inadequate warm-up before practice or play or failure to stretch before participation.  Injury to the tendon. PREVENTION   Warm up and stretch before practice or competition.  Allow time for adequate rest and recovery between practices and competition.  Keep up conditioning.  Keep up ankle and leg flexibility.  Improve or keep muscle strength and endurance.  Improve cardiovascular fitness.  Use proper technique.  Use proper equipment (shoes, skates).  To help prevent recurrence, taping, protective strapping, or an adhesive bandage may be recommended for several weeks after healing is complete. PROGNOSIS   Recovery may take weeks to several months to heal.  Longer recovery is expected if symptoms have been prolonged.  Recovery is usually quicker if the inflammation is due to a direct blow as compared with overuse or sudden strain. RELATED COMPLICATIONS   Healing time will be prolonged if the condition is not correctly treated. The injury must be given plenty of time to heal.  Symptoms can reoccur if  activity is resumed too soon.  Untreated, tendinitis may increase the risk of tendon rupture requiring additional time for recovery and possibly surgery. TREATMENT   The first treatment consists of rest anti-inflammatory medication, and ice to relieve the pain.  Stretching and strengthening exercises after resolution of pain will likely help reduce the risk of recurrence. Referral to a physical therapist or athletic trainer for further evaluation and treatment may be helpful.  A walking boot or cast may be recommended to rest the Achilles tendon. This can help break the cycle of inflammation and microtrauma.  Arch supports (orthotics) may be prescribed or recommended by your caregiver as an adjunct to therapy and rest.  Surgery to remove the inflamed tendon lining or degenerated tendon tissue is rarely necessary and has shown less than predictable results. MEDICATION   Nonsteroidal anti-inflammatory medications, such as aspirin and ibuprofen, may be used for pain and inflammation relief. Do not take within 7 days before surgery. Take these as directed by your caregiver. Contact your caregiver immediately if any bleeding, stomach upset, or signs of allergic reaction occur. Other minor pain relievers, such as acetaminophen, may also be used.  Pain relievers may be prescribed as necessary by your caregiver. Do not take prescription pain medication for longer than 4 to 7 days. Use only as directed and only as much as you need.  Cortisone injections are rarely indicated. Cortisone injections may weaken tendons and predispose to rupture. It is better to give the condition more time to heal than to use them. HEAT AND COLD  Cold is used to relieve pain and reduce inflammation for acute and chronic Achilles tendinitis. Cold should be applied for 10 to 15 minutes   every 2 to 3 hours for inflammation and pain and immediately after any activity that aggravates your symptoms. Use ice packs or an ice  massage.  Heat may be used before performing stretching and strengthening activities prescribed by your caregiver. Use a heat pack or a warm soak. SEEK MEDICAL CARE IF:  Symptoms get worse or do not improve in 2 weeks despite treatment.  New, unexplained symptoms develop. Drugs used in treatment may produce side effects. EXERCISES RANGE OF MOTION (ROM) AND STRETCHING EXERCISES - Achilles Tendinitis  These exercises may help you when beginning to rehabilitate your injury. Your symptoms may resolve with or without further involvement from your physician, physical therapist or athletic trainer. While completing these exercises, remember:   Restoring tissue flexibility helps normal motion to return to the joints. This allows healthier, less painful movement and activity.  An effective stretch should be held for at least 30 seconds.  A stretch should never be painful. You should only feel a gentle lengthening or release in the stretched tissue. STRETCH  Gastroc, Standing   Place hands on wall.  Extend right / left leg, keeping the front knee somewhat bent.  Slightly point your toes inward on your back foot.  Keeping your right / left heel on the floor and your knee straight, shift your weight toward the wall, not allowing your back to arch.  You should feel a gentle stretch in the right / left calf. Hold this position for ____3______ seconds. Repeat ___15_______ times. Complete this stretch ____2______ times per day. STRETCH  Soleus, Standing   Place hands on wall.  Extend right / left leg, keeping the other knee somewhat bent.  Slightly point your toes inward on your back foot.  Keep your right / left heel on the floor, bend your back knee, and slightly shift your weight over the back leg so that you feel a gentle stretch deep in your back calf.  Hold this position for ____3______ seconds. Repeat ____15______ times. Complete this stretch _____2_____ times per day. STRETCH   Gastrocsoleus, Standing  Note: This exercise can place a lot of stress on your foot and ankle. Please complete this exercise only if specifically instructed by your caregiver.   Place the ball of your right / left foot on a step, keeping your other foot firmly on the same step.  Hold on to the wall or a rail for balance.  Slowly lift your other foot, allowing your body weight to press your heel down over the edge of the step.  You should feel a stretch in your right / left calf.  Hold this position for __3_______ seconds.  Repeat this exercise with a slight bend in your knee. Repeat _____15_____ times. Complete this stretch ______2____ times per day.  STRENGTHENING EXERCISES - Achilles Tendinitis These exercises may help you when beginning to rehabilitate your injury. They may resolve your symptoms with or without further involvement from your physician, physical therapist or athletic trainer. While completing these exercises, remember:   Muscles can gain both the endurance and the strength needed for everyday activities through controlled exercises.  Complete these exercises as instructed by your physician, physical therapist or athletic trainer. Progress the resistance and repetitions only as guided.  You may experience muscle soreness or fatigue, but the pain or discomfort you are trying to eliminate should never worsen during these exercises. If this pain does worsen, stop and make certain you are following the directions exactly. If the pain is still present after  adjustments, discontinue the exercise until you can discuss the trouble with your clinician. STRENGTH - Plantar-flexors   Sit with your right / left leg extended. Holding onto both ends of a rubber exercise band/tubing, loop it around the ball of your foot. Keep a slight tension in the band.  Slowly push your toes away from you, pointing them downward.  Hold this position for ____3______ seconds. Return slowly, controlling  the tension in the band/tubing. Repeat ____15______ times. Complete this exercise ____2______ times per day.  STRENGTH - Plantar-flexors   Stand with your feet shoulder width apart. Steady yourself with a wall or table using as little support as needed.  Keeping your weight evenly spread over the width of your feet, rise up on your toes.*  Hold this position for _____3_____ seconds. Repeat _____15_____ times. Complete this exercise ____2______ times per day.  *If this is too easy, shift your weight toward your right / left leg until you feel challenged. Ultimately, you may be asked to do this exercise with your right / left foot only. STRENGTH  Plantar-flexors, Eccentric  Note: This exercise can place a lot of stress on your foot and ankle. Please complete this exercise only if specifically instructed by your caregiver.   Place the balls of your feet on a step. With your hands, use only enough support from a wall or rail to keep your balance.  Keep your knees straight and rise up on your toes.  Slowly shift your weight entirely to your right / left toes and pick up your opposite foot. Gently and with controlled movement, lower your weight through your right / left foot so that your heel drops below the level of the step. You will feel a slight stretch in the back of your calf at the end position.  Use the healthy leg to help rise up onto the balls of both feet, then lower weight only on the right / left leg again. Build up to 15 repetitions. Then progress to 3 consecutive sets of 15 repetitions.*  After completing the above exercise, complete the same exercise with a slight knee bend (about 30 degrees). Again, build up to 15 repetitions. Then progress to 3 consecutive sets of 15 repetitions.* Perform this exercise ____2______ times per day.  *When you easily complete 3 sets of 15, your physician, physical therapist or athletic trainer may advise you to add resistance by wearing a backpack  filled with additional weight. STRENGTH - Plantar Flexors, Seated   Sit on a chair that allows your feet to rest flat on the ground. If necessary, sit at the edge of the chair.  Keeping your toes firmly on the ground, lift your right / left heel as far as you can without increasing any discomfort in your ankle. Repeat ____15______ times. Complete this exercise _____2_____ times a day. Document Released: 03/05/2005 Document Revised: 10/27/2011 Document Reviewed: 11/16/2008 [facility] Patient Information 2014 [place], [place].

## 2013-09-28 ENCOUNTER — Other Ambulatory Visit: Payer: Self-pay | Admitting: *Deleted

## 2013-09-28 DIAGNOSIS — M7660 Achilles tendinitis, unspecified leg: Secondary | ICD-10-CM

## 2013-10-03 ENCOUNTER — Inpatient Hospital Stay: Admission: RE | Admit: 2013-10-03 | Payer: BC Managed Care – PPO | Source: Ambulatory Visit

## 2013-10-05 ENCOUNTER — Other Ambulatory Visit: Payer: Self-pay | Admitting: *Deleted

## 2013-10-05 DIAGNOSIS — M7661 Achilles tendinitis, right leg: Secondary | ICD-10-CM

## 2013-10-05 MED ORDER — METHOCARBAMOL 500 MG PO TABS: 500.0000 mg | ORAL_TABLET | Freq: Three times a day (TID) | ORAL | Status: DC

## 2013-10-10 ENCOUNTER — Ambulatory Visit
Admission: RE | Admit: 2013-10-10 | Discharge: 2013-10-10 | Disposition: A | Discharge status: Home / Self Care | Payer: BC Managed Care – PPO | Source: Ambulatory Visit | Attending: Orthopedic Surgery | Admitting: Orthopedic Surgery

## 2013-10-10 DIAGNOSIS — M7660 Achilles tendinitis, unspecified leg: Secondary | ICD-10-CM

## 2013-10-20 ENCOUNTER — Encounter: Payer: Self-pay | Admitting: Orthopedic Surgery

## 2013-10-20 ENCOUNTER — Ambulatory Visit (INDEPENDENT_AMBULATORY_CARE_PROVIDER_SITE_OTHER): Payer: BC Managed Care – PPO | Admitting: Orthopedic Surgery

## 2013-10-20 VITALS — BP 135/84 | Ht 68.0 in | Wt 171.0 lb

## 2013-10-20 DIAGNOSIS — M2140 Flat foot [pes planus] (acquired), unspecified foot: Secondary | ICD-10-CM

## 2013-10-20 DIAGNOSIS — M7660 Achilles tendinitis, unspecified leg: Secondary | ICD-10-CM

## 2013-10-20 MED ORDER — NABUMETONE 500 MG PO TABS: 500.0000 mg | ORAL_TABLET | Freq: Two times a day (BID) | ORAL | Status: DC

## 2013-10-20 MED ORDER — HYDROCODONE-ACETAMINOPHEN 5-325 MG PO TABS: 1.0000 | ORAL_TABLET | ORAL | Status: DC | PRN

## 2013-10-20 NOTE — Progress Notes (Signed)
Patient ID: Carol [name], female   DOB: 01/13/1966, 48 y.o.   MRN: [id number]  Encounter Diagnosis  Name Primary?  . Achilles bursitis or tendinitis Yes   Chief Complaint  Patient presents with  . Follow-up    4 week recheck right achilles and MRI results   BP 135/84  Ht 5\' 8"  (1.727 m)  Wt 171 lb (77.565 kg)  BMI 26.01 kg/m2  The patient has not improved after several weeks to months in a boot she had an MRI showed no tear just inflammation  Recommend she go out of work for the next 4 weeks start Relafen continue Norco  Left foot bothering her looks normal fields normal tender plantar aspect she has bilateral pes planus recommend orthotics  Return 4 weeks   Meds ordered this encounter  Medications  . nabumetone (RELAFEN) 500 MG tablet    Sig: Take 1 tablet (500 mg total) by mouth 2 (two) times daily.    Dispense:  60 tablet    Refill:  5  . HYDROcodone-acetaminophen (NORCO/VICODIN) 5-325 MG per tablet    Sig: Take 1 tablet by mouth every 4 (four) hours as needed for moderate pain.    Dispense:  60 tablet    Refill:  0

## 2013-10-20 NOTE — Patient Instructions (Signed)
OOW X 4 WEEKS  ?

## 2013-10-24 ENCOUNTER — Telehealth: Payer: Self-pay | Admitting: Orthopedic Surgery

## 2013-10-24 NOTE — Telephone Encounter (Signed)
Carol [name] said her supervisor wants to know if she can return to work,  You put her out for 4 weeks as of last Thusrday (10/20/13) for 4 weeks.  She says there are no light  Duty jobs available as all jobs  involve standing  and walking all day .Marland Kitchen

## 2013-10-25 ENCOUNTER — Telehealth: Payer: Self-pay | Admitting: Orthopedic Surgery

## 2013-10-25 NOTE — Telephone Encounter (Signed)
yes

## 2013-10-25 NOTE — Telephone Encounter (Signed)
Kimley asked if she can use one of the walkers where one foot is on the ground and the other is on a seat with the knee extended backwards. Thinks she can work if she has one of these

## 2013-10-25 NOTE — Telephone Encounter (Signed)
Its up to her   My advice is to be out as noted

## 2013-10-25 NOTE — Telephone Encounter (Signed)
Left patient a message of DR. Harrison's reply.

## 2013-10-25 NOTE — Telephone Encounter (Signed)
Patient  Aware of doctor's reply

## 2013-10-25 NOTE — Telephone Encounter (Signed)
Called patient, message said not available

## 2013-11-01 ENCOUNTER — Telehealth: Payer: Self-pay | Admitting: Orthopedic Surgery

## 2013-11-01 NOTE — Telephone Encounter (Signed)
Left message that Dr. Aline Brochure recommends she be out of work, and if she had any further questions she could call back.

## 2013-11-03 NOTE — Telephone Encounter (Signed)
11/03/13 Received calls back from both (1) patient, in response, relaying that she feels it is best to continue to stay out of work as Dr Aline Brochure advises, and return when he releases her - patient has authorized for employer to receive this information;  And (2) her employer's Human Resources contact [employer], who again states that they are willing to accomodate patient with this rolling scooter as noted, and see that she has sedentary work duties, with using the scooter to move around as needed. Ms. Georgina Snell ph# in Human Resources Department is [phone].  I relayed to her that patient will remain out of work as work note issued at most recent office visit indicates until further notice.

## 2013-11-10 ENCOUNTER — Telehealth: Payer: Self-pay | Admitting: Orthopedic Surgery

## 2013-11-10 NOTE — Telephone Encounter (Signed)
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  Her pharmacy is [facility] ([place] or [place])  - patient ph# is [phone]

## 2013-11-10 NOTE — Telephone Encounter (Signed)
Patient informed of Dr. Harrison's reply. 

## 2013-11-10 NOTE — Telephone Encounter (Signed)
Office policy no sleeping pills if having trouble sleeping the only thing I recommend is Benadryl 50 mg at night before going to bed

## 2013-11-10 NOTE — Telephone Encounter (Signed)
Noted, nurse has been sent the note to advise patient.

## 2013-11-10 NOTE — Telephone Encounter (Signed)
Routing to Dr Harrison 

## 2013-11-17 ENCOUNTER — Encounter: Payer: Self-pay | Admitting: Orthopedic Surgery

## 2013-11-17 ENCOUNTER — Ambulatory Visit (INDEPENDENT_AMBULATORY_CARE_PROVIDER_SITE_OTHER): Payer: BC Managed Care – PPO | Admitting: Orthopedic Surgery

## 2013-11-17 VITALS — BP 164/95 | Ht 68.0 in | Wt 171.0 lb

## 2013-11-17 DIAGNOSIS — M7660 Achilles tendinitis, unspecified leg: Secondary | ICD-10-CM

## 2013-11-17 NOTE — Progress Notes (Signed)
Patient ID: Carol [name], female   DOB: [birth year], 48 y.o.   MRN: [id number]   Chief Complaint  Patient presents with  . Follow-up    4 week follow up right achilles tendon    Diagnosis Achilles tendinitis right foot.  Previous treatment Cam Walker, injection, therapy, anti-inflammatories including Relafen, Norco, out of work since last visit  Work status out of work since last visit. Pain in the Achilles but dorsal foot pain has developed with swelling  No new injuries, no numbness or tingling in the foot  The Achilles is nontender no swelling. There is tenderness on the dorsum of the foot and this appears to be related to the Cam Walker and the strap over the dorsum of the foot. The ankle remains stable no motor deficits or atrophy the skin is intact without rash good pulses are noted distally. BP 164/95  Ht 5\' 8"  (1.727 m)  Wt 171 lb (77.565 kg)  BMI 26.01 kg/m2   Resolving Achilles tendinitis now a dorsal foot pain  I wanted to walk without the boot for the next 10 days, be out of the office next week. All see her on the 13th for reevaluation. Continue to work status until through that date.

## 2013-11-17 NOTE — Patient Instructions (Signed)
Remove brace  Walk without for next 11 days   No work thru 13 th   F/u 13th

## 2013-11-28 ENCOUNTER — Ambulatory Visit (INDEPENDENT_AMBULATORY_CARE_PROVIDER_SITE_OTHER): Payer: BC Managed Care – PPO | Admitting: Orthopedic Surgery

## 2013-11-28 ENCOUNTER — Encounter: Payer: Self-pay | Admitting: Orthopedic Surgery

## 2013-11-28 VITALS — BP 155/94 | Ht 68.0 in | Wt 171.0 lb

## 2013-11-28 DIAGNOSIS — M7661 Achilles tendinitis, right leg: Secondary | ICD-10-CM

## 2013-11-28 DIAGNOSIS — M7660 Achilles tendinitis, unspecified leg: Secondary | ICD-10-CM

## 2013-11-28 MED ORDER — NABUMETONE 500 MG PO TABS: 500.0000 mg | ORAL_TABLET | Freq: Two times a day (BID) | ORAL | Status: DC

## 2013-11-28 MED ORDER — HYDROCODONE-ACETAMINOPHEN 5-325 MG PO TABS: 1.0000 | ORAL_TABLET | ORAL | Status: DC | PRN

## 2013-11-28 MED ORDER — METHOCARBAMOL 500 MG PO TABS: 500.0000 mg | ORAL_TABLET | Freq: Three times a day (TID) | ORAL | Status: DC

## 2013-11-28 NOTE — Patient Instructions (Addendum)
Referral to DR. Mckinney for right toe calus  Take the following medications   Meds ordered this encounter  Medications  . methocarbamol (ROBAXIN) 500 MG tablet    Sig: Take 1 tablet (500 mg total) by mouth 3 (three) times daily.    Dispense:  60 tablet    Refill:  2  . nabumetone (RELAFEN) 500 MG tablet    Sig: Take 1 tablet (500 mg total) by mouth 2 (two) times daily.    Dispense:  60 tablet    Refill:  5  . HYDROcodone-acetaminophen (NORCO/VICODIN) 5-325 MG per tablet    Sig: Take 1 tablet by mouth every 4 (four) hours as needed for moderate pain.    Dispense:  60 tablet    Refill:  0

## 2013-11-28 NOTE — Progress Notes (Signed)
Patient ID: Carol [name], female   DOB: March 03, 1966, 48 y.o.   MRN: [id number]  Chief Complaint  Patient presents with  . Follow-up    11 day recheck right achilles    The patient is trending in the right direction she is almost pain-free if we waited about 6 or 7 days we should be in good shape. She should however have some restrictions to standing 3 hours consecutively  Review of systems patient complains of pain over the small toe of the right foot with callus, refer to podiatry  Physical Exam(6) GENERAL: normal development   CDV: pulses are normal   Skin: normal  Psychiatric: awake, alert and oriented  Neuro: normal sensation  MSK Gait:  Normal 1 callus noted over the small right toe lateral plantar aspect 2 ankle range of motion normal 3 stability normal 4 Achilles tendon mild tenderness no swelling  Imaging: No imaging today  Assessment: Achilles tendinitis improving                        Right small toe callous refer to podiatry    Plan: Follow  2 weeks after returning to work on the 19th

## 2013-11-29 ENCOUNTER — Telehealth: Payer: Self-pay | Admitting: Orthopedic Surgery

## 2013-11-29 NOTE — Telephone Encounter (Signed)
Notes, including work notes, faxed as per patient request to [employer], patient's employer, to attention Human resources, Attention Maudie Mercury, fax [phone]; signed authorization on file.  Patient aware.

## 2013-11-30 ENCOUNTER — Other Ambulatory Visit: Payer: Self-pay | Admitting: *Deleted

## 2013-11-30 ENCOUNTER — Telehealth: Payer: Self-pay | Admitting: *Deleted

## 2013-11-30 DIAGNOSIS — L84 Corns and callosities: Secondary | ICD-10-CM

## 2013-11-30 NOTE — Telephone Encounter (Signed)
Patient is scheduled to see Dr. Caprice Beaver 12/30/13 at 3:15 pm.Patient aware of appointment date and time. Advised to take Insurance card, co pay, and medications.

## 2013-12-20 ENCOUNTER — Ambulatory Visit: Payer: BC Managed Care – PPO | Admitting: Orthopedic Surgery

## 2013-12-22 ENCOUNTER — Encounter: Payer: Self-pay | Admitting: Orthopedic Surgery

## 2013-12-22 ENCOUNTER — Ambulatory Visit (INDEPENDENT_AMBULATORY_CARE_PROVIDER_SITE_OTHER): Payer: BC Managed Care – PPO | Admitting: Orthopedic Surgery

## 2013-12-22 VITALS — BP 145/88 | Ht 68.0 in | Wt 171.0 lb

## 2013-12-22 DIAGNOSIS — M7660 Achilles tendinitis, unspecified leg: Secondary | ICD-10-CM

## 2013-12-22 MED ORDER — HYDROCODONE-ACETAMINOPHEN 5-325 MG PO TABS: 1.0000 | ORAL_TABLET | ORAL | Status: DC | PRN

## 2013-12-22 MED ORDER — NABUMETONE 500 MG PO TABS: 500.0000 mg | ORAL_TABLET | Freq: Two times a day (BID) | ORAL | Status: DC

## 2013-12-22 NOTE — Progress Notes (Signed)
Patient ID: Carol [name], female   DOB: 24-Aug-1965, 48 y.o.   MRN: [id number] Chief Complaint  Patient presents with  . Follow-up    3 week recheck on right achilles after RTW.    The patient tried returning to work her pain is worse. Review of systems pain radiates proximally  Tenderness swelling normal range of motion ankle joint stable strength normal skin normal pulse normal sensation normal  Achilles bursitis  Long Cam Walker  Followup 6 weeks  Work restrictions Meds ordered this encounter  Medications  . HYDROcodone-acetaminophen (NORCO/VICODIN) 5-325 MG per tablet    Sig: Take 1 tablet by mouth every 4 (four) hours as needed for moderate pain.    Dispense:  60 tablet    Refill:  0  . nabumetone (RELAFEN) 500 MG tablet    Sig: Take 1 tablet (500 mg total) by mouth 2 (two) times daily.    Dispense:  60 tablet    Refill:  5

## 2013-12-22 NOTE — Patient Instructions (Addendum)
Wear boot 6 weeks   Wear boot to work   Return in 6 weeks   [place] !

## 2014-01-03 ENCOUNTER — Ambulatory Visit: Payer: BC Managed Care – PPO

## 2014-01-11 ENCOUNTER — Telehealth: Payer: Self-pay | Admitting: Orthopedic Surgery

## 2014-01-11 NOTE — Telephone Encounter (Signed)
Noted  

## 2014-01-11 NOTE — Telephone Encounter (Signed)
Patient called, states right leg has some redness and possible rash around the area where brace is worn, states started yesterday, 01/10/14, from inside of right calf, toward ankle.  Offered appointment for tomorrow-she is checking schedule for [place] to bring her in tomorrow.  Ph [phone]

## 2014-01-12 ENCOUNTER — Ambulatory Visit: Payer: BC Managed Care – PPO | Admitting: Orthopedic Surgery

## 2014-01-16 ENCOUNTER — Telehealth: Payer: Self-pay | Admitting: Orthopedic Surgery

## 2014-01-16 ENCOUNTER — Ambulatory Visit: Payer: BC Managed Care – PPO | Admitting: Orthopedic Surgery

## 2014-01-16 NOTE — Telephone Encounter (Signed)
Routing to Dr Harrison 

## 2014-01-16 NOTE — Telephone Encounter (Signed)
Patient called regarding today's appointment for rash around brace area, scheduled today, 01/16/14 -- which is a re-schedule of 01/13/14, which patient missed.  She states her skin no longer has any irritation at all, due to her completely discontinuing brace. Her original follow up appointment is 02/02/14.  She wants to cancel today, and continue without brace until then. She is working full duty, with job duties that require standing and walking. Please advise. Her ph# [phone].

## 2014-01-16 NOTE — Telephone Encounter (Signed)
Noted.  Called back to patient, reached voice message - relayed.  Also added to contact our office if any questions or if she should have any recurring symptoms, to please call to request immediate appointment.

## 2014-01-16 NOTE — Telephone Encounter (Signed)
Ok to cancel

## 2014-02-02 ENCOUNTER — Ambulatory Visit: Payer: BC Managed Care – PPO | Admitting: Orthopedic Surgery

## 2014-02-16 ENCOUNTER — Ambulatory Visit (INDEPENDENT_AMBULATORY_CARE_PROVIDER_SITE_OTHER): Payer: BC Managed Care – PPO | Admitting: Orthopedic Surgery

## 2014-02-16 DIAGNOSIS — M7660 Achilles tendinitis, unspecified leg: Secondary | ICD-10-CM

## 2014-02-16 DIAGNOSIS — M2140 Flat foot [pes planus] (acquired), unspecified foot: Secondary | ICD-10-CM

## 2014-02-16 DIAGNOSIS — M2142 Flat foot [pes planus] (acquired), left foot: Secondary | ICD-10-CM

## 2014-02-16 DIAGNOSIS — M2141 Flat foot [pes planus] (acquired), right foot: Secondary | ICD-10-CM

## 2014-02-16 MED ORDER — HYDROCODONE-ACETAMINOPHEN 5-325 MG PO TABS: 1.0000 | ORAL_TABLET | ORAL | Status: DC | PRN

## 2014-02-16 NOTE — Patient Instructions (Signed)
Get stockings from [place]

## 2014-02-16 NOTE — Progress Notes (Signed)
Patient ID: Carol [name], female   DOB: 02/09/1966, 49 y.o.   MRN: [id number]   Followup visit pain right foot pain left foot  The patient's new onset of symptoms review of systems pain plantar aspect and medial aspect left foot  Right foot continues to have discomfort and swelling despite being on hydrocodone for pain she takes Relafen on occasion on the weekend she also tried some Robaxin without good relief. Of the night somewhat worse the more she stands  No swelling today but she has menopause that much. She has tenderness in the Achilles tendon on the right posterior tibial tendon on the left is bilateral flexible pes planus. Her mood is normal gait is normal she is oriented x3 her appearance was normal  Neurovascular exam intact bilaterally  Impression pes planus with Achilles tendinitis  Recommend TED hose to help with swelling take Relafen at night continue Norco for pain

## 2014-03-23 ENCOUNTER — Ambulatory Visit: Payer: BC Managed Care – PPO | Admitting: Orthopedic Surgery

## 2014-03-30 ENCOUNTER — Ambulatory Visit (INDEPENDENT_AMBULATORY_CARE_PROVIDER_SITE_OTHER): Payer: BC Managed Care – PPO | Admitting: Orthopedic Surgery

## 2014-03-30 ENCOUNTER — Encounter: Payer: Self-pay | Admitting: Orthopedic Surgery

## 2014-03-30 VITALS — BP 131/79 | Ht 68.0 in | Wt 171.0 lb

## 2014-03-30 DIAGNOSIS — M7660 Achilles tendinitis, unspecified leg: Secondary | ICD-10-CM

## 2014-03-30 MED ORDER — MELOXICAM 7.5 MG PO TABS: 7.5000 mg | ORAL_TABLET | Freq: Two times a day (BID) | ORAL | Status: DC

## 2014-03-30 MED ORDER — HYDROCODONE-ACETAMINOPHEN 5-325 MG PO TABS: 1.0000 | ORAL_TABLET | ORAL | Status: DC | PRN

## 2014-03-30 NOTE — Progress Notes (Signed)
Followup  Chief Complaint  Patient presents with  . Follow-up    5 week recheck Right Achilles    BP 131/79  Ht 5\' 8"  (1.727 m)  Wt 171 lb (77.565 kg)  BMI 26.01 kg/m2  The patient still continues to have difficulty with her right Achilles despite multiple interventions including injection, Cam Walker, Norco and Relafen and ibuprofen.  Review of systems no new findings  Exam shows tenderness of the Achilles tendon no swelling in the bursa today ankle range of motion is normal muscle tone intact skin normal pulses normal ambulation normal mood normal orientation normal appearance normal  Impression Achilles tendinitis  Continue Norco however try Mobic 7.5 twice a day just to see if her getting effect from a different anti-inflammatory  Followup after her foot surgery with Dr. Caprice Beaver

## 2014-03-31 ENCOUNTER — Other Ambulatory Visit (HOSPITAL_COMMUNITY): Payer: BC Managed Care – PPO

## 2014-04-12 ENCOUNTER — Other Ambulatory Visit: Payer: Self-pay | Admitting: Podiatry

## 2014-05-15 ENCOUNTER — Encounter (HOSPITAL_COMMUNITY): Payer: Self-pay | Admitting: Pharmacy Technician

## 2014-05-22 NOTE — Patient Instructions (Signed)
Carol [name]  05/22/2014   Your procedure is scheduled on:  05/31/2014  Report to [facility] at 8:50 AM.  Call this number if you have problems the morning of surgery: [phone]   Remember:   Do not eat food or drink liquids after midnight.   Take these medicines the morning of surgery with A SIP OF WATER:   Hydrocodone, Robaxin   Do not wear jewelry, make-up or nail polish.  Do not wear lotions, powders, or perfumes. You may wear deodorant.  Do not shave 48 hours prior to surgery. Men may shave face and neck.  Do not bring valuables to the hospital.  [facility] is not responsible for any belongings or valuables.               Contacts, dentures or bridgework may not be worn into surgery.  Leave suitcase in the car. After surgery it may be brought to your room.  For patients admitted to the hospital, discharge time is determined by your treatment team.               Patients discharged the day of surgery will not be allowed to drive home.  Name and phone number of your driver:   Special Instructions: Shower using CHG 2 nights before surgery and the night before surgery.  If you shower the day of surgery use CHG.  Use special wash - you have one bottle of CHG for all showers.  You should use approximately 1/3 of the bottle for each shower.   Please read over the following fact sheets that you were given: Surgical Site Infection Prevention and Anesthesia Post-op Instructions   PATIENT INSTRUCTIONS POST-ANESTHESIA  IMMEDIATELY FOLLOWING SURGERY:  Do not drive or operate machinery for the first twenty four hours after surgery.  Do not make any important decisions for twenty four hours after surgery or while taking narcotic pain medications or sedatives.  If you develop intractable nausea and vomiting or a severe headache please notify your doctor immediately.  FOLLOW-UP:  Please make an appointment with your surgeon as instructed. You do not need to follow up with anesthesia unless  specifically instructed to do so.  WOUND CARE INSTRUCTIONS (if applicable):  Keep a dry clean dressing on the anesthesia/puncture wound site if there is drainage.  Once the wound has quit draining you may leave it open to air.  Generally you should leave the bandage intact for twenty four hours unless there is drainage.  If the epidural site drains for more than 36-48 hours please call the anesthesia department.  QUESTIONS?:  Please feel free to call your physician or the hospital operator if you have any questions, and they will be happy to assist you.      Hammer Toes Hammer toes is a condition in which one or more of your toes is permanently flexed. CAUSES  This happens when a muscle imbalance or abnormal bone length makes your small toes buckle. This causes the toe joint to contract and the strong cord-like bands that attach muscles to the bones (tendons) in your toes to shorten.  SIGNS AND SYMPTOMS  Common symptoms of flexible hammer toes include:   A buildup of skin cells (corns). Corns occur where boney bumps come in frequent contact with hard surfaces. For example, where your shoes press and rub.  Irritation.  Inflammation.  Pain.  Limited motion in your toes. DIAGNOSIS  Hammer toes are diagnosed through a physical exam of your toes. During the  exam, your health care provider may try to reproduce your symptoms by manipulating your foot. Often, X-ray exams are done to determine the degree of deformity and to make sure that the cause is not a fracture.  TREATMENT  Hammer toes can be treated with corrective surgery. There are several types of surgical procedures that can treat hammer toes. The most common procedures include:  Arthroplasty--A portion of the joint is surgically removed and your toe is straightened. The gap fills in with fibrous tissue. This procedure helps treat pain and deformity and helps restore function.  Fusion--Cartilage between the two bones of the affected  joint is taken out and the bones fuse together into one longer bone. This helps keep your toe stable and reduces pain but leaves your toe stiff, yet straight.  Implantation--A portion of your bone is removed and replaced with an implant to restore motion.  Flexor tendon transfers--This procedure repositions the tendons that curl the toes down (flexor tendons). This may be done to release the deforming force that causes your toe to buckle. Several of these procedures require fixing your toe with a pin that is visible at the tip of your toe. The pin keeps the toe straight during healing. Your health care provider will remove the pin usually within 4-8 weeks after the procedure.  Document Released: 08/01/2000 Document Revised: 08/09/2013 Document Reviewed: 04/11/2013 [facility] Patient Information 2015 [place], [place]. This information is not intended to replace advice given to you by your health care provider. Make sure you discuss any questions you have with your health care provider.

## 2014-05-23 ENCOUNTER — Encounter (HOSPITAL_COMMUNITY)
Admission: RE | Admit: 2014-05-23 | Discharge: 2014-05-23 | Disposition: A | Discharge status: Home / Self Care | Payer: BC Managed Care – PPO | Source: Ambulatory Visit | Attending: Podiatry | Admitting: Podiatry

## 2014-05-24 ENCOUNTER — Telehealth: Payer: Self-pay | Admitting: Orthopedic Surgery

## 2014-05-24 ENCOUNTER — Other Ambulatory Visit: Payer: Self-pay | Admitting: *Deleted

## 2014-05-24 DIAGNOSIS — M7660 Achilles tendinitis, unspecified leg: Secondary | ICD-10-CM

## 2014-05-24 MED ORDER — MELOXICAM 7.5 MG PO TABS: 7.5000 mg | ORAL_TABLET | Freq: Every day | ORAL | Status: DC

## 2014-05-24 MED ORDER — HYDROCODONE-ACETAMINOPHEN 5-325 MG PO TABS: 1.0000 | ORAL_TABLET | ORAL | Status: DC | PRN

## 2014-05-24 NOTE — Telephone Encounter (Signed)
Mrs. Cumber is calling asking for a refill on her pain medication HYDROcodone-acetaminophen (NORCO/VICODIN) 5-325 MG per please advise?

## 2014-05-25 NOTE — Telephone Encounter (Signed)
Prescription available for pick up, called patient, no answer, left vm

## 2014-05-31 ENCOUNTER — Encounter (HOSPITAL_COMMUNITY): Admission: RE | Payer: Self-pay | Source: Ambulatory Visit

## 2014-05-31 ENCOUNTER — Ambulatory Visit (HOSPITAL_COMMUNITY): Admission: RE | Admit: 2014-05-31 | Payer: BC Managed Care – PPO | Source: Ambulatory Visit | Admitting: Podiatry

## 2014-05-31 SURGERY — CORRECTION, HAMMER TOE
Anesthesia: Monitor Anesthesia Care | Laterality: Bilateral

## 2014-06-01 NOTE — Telephone Encounter (Signed)
Patient picked up RX.

## 2014-06-12 ENCOUNTER — Ambulatory Visit: Payer: BC Managed Care – PPO | Admitting: Family Medicine

## 2014-06-20 ENCOUNTER — Encounter: Payer: Self-pay | Admitting: Family Medicine

## 2014-07-04 ENCOUNTER — Ambulatory Visit: Payer: BC Managed Care – PPO | Admitting: Orthopedic Surgery

## 2014-07-20 ENCOUNTER — Ambulatory Visit: Payer: BC Managed Care – PPO | Admitting: Orthopedic Surgery

## 2014-09-11 ENCOUNTER — Encounter (HOSPITAL_COMMUNITY): Payer: Self-pay | Admitting: Emergency Medicine

## 2014-09-11 ENCOUNTER — Emergency Department (HOSPITAL_COMMUNITY): Payer: BLUE CROSS/BLUE SHIELD

## 2014-09-11 ENCOUNTER — Emergency Department (HOSPITAL_COMMUNITY)
Admission: EM | Admit: 2014-09-11 | Discharge: 2014-09-11 | Disposition: A | Discharge status: Home / Self Care | Payer: BLUE CROSS/BLUE SHIELD | Attending: Emergency Medicine | Admitting: Emergency Medicine

## 2014-09-11 DIAGNOSIS — M62838 Other muscle spasm: Secondary | ICD-10-CM

## 2014-09-11 DIAGNOSIS — F17210 Nicotine dependence, cigarettes, uncomplicated: Secondary | ICD-10-CM | POA: Insufficient documentation

## 2014-09-11 DIAGNOSIS — M542 Cervicalgia: Secondary | ICD-10-CM | POA: Insufficient documentation

## 2014-09-11 MED ORDER — CYCLOBENZAPRINE HCL 10 MG PO TABS: 10.0000 mg | ORAL_TABLET | Freq: Three times a day (TID) | ORAL | Status: DC | PRN

## 2014-09-11 MED ORDER — DIAZEPAM 5 MG PO TABS
5.0000 mg | ORAL_TABLET | Freq: Once | ORAL | Status: AC
  Administered 2014-09-11: 5 mg via ORAL
  Filled 2014-09-11: qty 1

## 2014-09-11 MED ORDER — TRAMADOL HCL 50 MG PO TABS: 50.0000 mg | ORAL_TABLET | Freq: Four times a day (QID) | ORAL | Status: DC | PRN

## 2014-09-11 MED ORDER — OXYCODONE-ACETAMINOPHEN 5-325 MG PO TABS
2.0000 | ORAL_TABLET | Freq: Once | ORAL | Status: AC
  Administered 2014-09-11: 2 via ORAL
  Filled 2014-09-11: qty 2

## 2014-09-11 NOTE — ED Provider Notes (Signed)
CSN: [id number]     Arrival date & time 09/11/14  0327 History   First MD Initiated Contact with Patient 09/11/14 [phone]     Chief Complaint  Patient presents with  . Neck Pain     (Consider location/radiation/quality/duration/timing/severity/associated sxs/prior Treatment) HPI Comments: 49 y/o female with a PMHx of migraines, HTN and bronchitis presenting with gradual onset neck pain beginning yesterday while she was at work. 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 Pain described as sharp, worse on the left side of her neck. No known injury or trauma. Pain worse while outside in the cold. She has not tried any alleviating factors. Denies numbness or tingling down extremities. No fevers. Denies chest pain or shortness of breath.  Patient is a 49 y.o. female presenting with neck pain. The history is provided by the patient.  Neck Pain   Past Medical History  Diagnosis Date  . History of migraine headaches   . Impaired fasting glucose   . Bronchitis   . Hypertension   . Back pain   . History of blood in urine     microscopic   Past Surgical History  Procedure Laterality Date  . Cholecystectomy    . Abdominal hysterectomy  2002  . Tubal ligation     Family History  Problem Relation Age of Onset  . Arthritis Paternal Grandmother   . Diabetes Paternal Grandmother   . Hypertension Paternal Grandmother   . Stroke Paternal Grandmother   . Colon cancer Neg Hx   . Diabetes Mother    History  Substance Use Topics  . Smoking status: Current Every Day Smoker -- 0.50 packs/day for 20 years    Types: Cigarettes, Cigars  . Smokeless tobacco: Never Used     Comment: Tobacco info given  . Alcohol Use: Yes     Comment: occ.   OB History    No data available     Review of Systems  Musculoskeletal: Positive for neck pain.  All other systems reviewed and are negative.     Allergies  Review of patient's allergies indicates no known allergies.  Home  Medications   Prior to Admission medications   Medication Sig Start Date End Date Taking? Authorizing Provider  fluticasone (CUTIVATE) 0.05 % cream Apply 1 application topically every morning. 08/02/13  Yes Historical Provider, MD  cyclobenzaprine (FLEXERIL) 10 MG tablet Take 1 tablet (10 mg total) by mouth 3 (three) times daily as needed for muscle spasms. 09/11/14   Carman Ching, PA-C  HYDROcodone-acetaminophen (NORCO/VICODIN) 5-325 MG per tablet Take 1 tablet by mouth every 4 (four) hours as needed for moderate pain. Patient not taking: Reported on 09/11/2014 05/24/14   Carole Civil, MD  meloxicam (MOBIC) 7.5 MG tablet Take 1 tablet (7.5 mg total) by mouth daily. Patient not taking: Reported on 09/11/2014 05/24/14   Carole Civil, MD  methocarbamol (ROBAXIN) 500 MG tablet Take 1 tablet (500 mg total) by mouth 3 (three) times daily. Patient not taking: Reported on 09/11/2014 11/28/13   Carole Civil, MD  NAFTIN 2 % GEL Apply 1 application topically every morning. 08/01/13   Historical Provider, MD  traMADol (ULTRAM) 50 MG tablet Take 1 tablet (50 mg total) by mouth every 6 (six) hours as needed. 09/11/14   Javontay Vandam M Lequan Dobratz, PA-C   BP 144/87 mmHg  Pulse 83  Temp(Src) 97.9 F (36.6 C) (Oral)  Resp 20  SpO2 97% Physical Exam  Constitutional: She is oriented to  person, place, and time. She appears well-developed and well-nourished. No distress.  HENT:  Head: Normocephalic and atraumatic.  Mouth/Throat: Oropharynx is clear and moist.  Eyes: Conjunctivae and EOM are normal.  Neck: Neck supple.  No meningeal signs.  Cardiovascular: Normal rate, regular rhythm and normal heart sounds.   Pulmonary/Chest: Effort normal and breath sounds normal. No respiratory distress.  Musculoskeletal: She exhibits no edema.  TTP bilateral cervical paraspinal muscles with left sided spasm. TTP left trapezius. No spinous process tenderness. ROM limited by pain.  Neurological: She is alert and oriented to  person, place, and time. No sensory deficit.  Strength UE 5/5 and equal bilateral.  Skin: Skin is warm and dry.  Psychiatric: She has a normal mood and affect. Her behavior is normal.  Nursing note and vitals reviewed.   ED Course  Procedures (including critical care time) Labs Review Labs Reviewed - No data to display  Imaging Review Dg Cervical Spine Complete  09/11/2014   CLINICAL DATA:  Neck pain after sleeping wrong on her neck. Neck pain since yesterday. No specific injury or trauma.  EXAM: CERVICAL SPINE  4+ VIEWS  COMPARISON:  None.  FINDINGS: Normal alignment of the cervical spine and facet joints. Degenerative changes throughout the cervical spine with narrowed cervical interspaces and endplate hypertrophic changes most prominent at C4-5, C5-6, and C6-7 levels. Bone spurs cause mild projection into the neural foramina at C5-6 on the right. No vertebral compression deformities. No prevertebral soft tissue swelling. No focal bone lesion or bone destruction. Bone cortex and trabecular architecture appear intact.  IMPRESSION: Degenerative changes in the cervical spine. No displaced fractures identified.   Electronically Signed   By: Lucienne Capers M.D.   On: 09/11/2014 05:36     EKG Interpretation None      MDM   Final diagnoses:  Neck pain  Neck muscle spasm   Pt in NAD. AFVSS. No red flags concerning pt's neck pain. No meningeal signs, no s/s central cord compression, no focal neuro deficits. Muscular tenderness on exam with spasm. Percocet and valium given. Pt reports no improvement, despite appearing to be resting comfortably on exam bed until myself or nurse walks into room. States "the little white pills don't do anything" and would like "something stronger for pain". States there "has to be something wrong inside". Will obtain xray, pt agreeable.  Xray without any acute findings. Pt was sleeping comfortably when I went to tell results. I had to wake her up. She was  sleeping with her head laterally rotated to the right which she would not do on initial exam. Stable for d/c. F/u with PCP. Return precautions given. Patient states understanding of treatment care plan and is agreeable.  Carman Ching, PA-C 09/11/14 [place], DO 09/11/14 [phone]

## 2014-09-11 NOTE — ED Notes (Signed)
Pt states that she has had neck pain since yesterday. Denies any injury or trauma. States that it "hurts worse when out in the cold."

## 2014-09-11 NOTE — Discharge Instructions (Signed)
No driving or operating heavy machinery while taking flexeril. This medication may make you drowsy. Take tramadol as directed as needed for pain. Rest, apply ice intermittently for the next 24 hours followed by heat. Avoid heavy lifting or hard physical activity.  Spasticity Spasticity is a condition in which certain muscles contract continuously. This causes stiffness or tightness of the muscles. It may interfere with movement, speech, and manner of walking. CAUSES  This condition is usually caused by damage to the portion of the brain or spinal cord that controls voluntary movement. It may occur in association with:  Spinal cord injury.  Multiple sclerosis.  Cerebral palsy.  Brain damage due to lack of oxygen.  Brain trauma.  Severe head injury.  Metabolic diseases such as:  Adrenoleukodystrophy.  ALS [place] Cerise Gehrig's disease).  Phenylketonuria. SYMPTOMS   Increased muscle tone (hypertonicity).  A series of rapid muscle contractions (clonus).  Exaggerated deep tendon reflexes.  Muscle spasms.  Involuntary crossing of the legs (scissoring).  Fixed joints. The degree of spasticity varies. It ranges from mild muscle stiffness to severe, painful, and uncontrollable muscle spasms. It can interfere with rehabilitation in patients with certain disorders. It often interferes with daily activities. TREATMENT  Treatment may include:  Medications.  Physical therapy regimens. They may include muscle stretching and range of motion exercises. These help prevent shrinkage or shortening of muscles. They also help reduce the severity of symptoms.  Surgery. This may be recommended for tendon release or to sever the nerve-muscle pathway. PROGNOSIS  The outcome for those with spasticity depends on:  Severity of the spasticity.  Associated disorder(s). Document Released: 07/25/2002 Document Revised: 10/27/2011 Document Reviewed: 10/18/2013 [facility] Patient Information 2015  [place], [place]. This information is not intended to replace advice given to you by your health care provider. Make sure you discuss any questions you have with your health care provider.  Cervical Sprain A cervical sprain is an injury in the neck in which the strong, fibrous tissues (ligaments) that connect your neck bones stretch or tear. Cervical sprains can range from mild to severe. Severe cervical sprains can cause the neck vertebrae to be unstable. This can lead to damage of the spinal cord and can result in serious nervous system problems. The amount of time it takes for a cervical sprain to get better depends on the cause and extent of the injury. Most cervical sprains heal in 1 to 3 weeks. CAUSES  Severe cervical sprains may be caused by:   Contact sport injuries (such as from football, rugby, wrestling, hockey, auto racing, gymnastics, diving, martial arts, or boxing).   Motor vehicle collisions.   Whiplash injuries. This is an injury from a sudden forward and backward whipping movement of the head and neck.  Falls.  Mild cervical sprains may be caused by:   Being in an awkward position, such as while cradling a telephone between your ear and shoulder.   Sitting in a chair that does not offer proper support.   Working at a poorly Landscape [employer] station.   Looking up or down for long periods of time.  SYMPTOMS   Pain, soreness, stiffness, or a burning sensation in the front, back, or sides of the neck. This discomfort may develop immediately after the injury or slowly, 24 hours or more after the injury.   Pain or tenderness directly in the middle of the back of the neck.   Shoulder or upper back pain.   Limited ability to move the neck.   Headache.  Dizziness.   Weakness, numbness, or tingling in the hands or arms.   Muscle spasms.   Difficulty swallowing or chewing.   Tenderness and swelling of the neck.  DIAGNOSIS  Most of the time your  health care provider can diagnose a cervical sprain by taking your history and doing a physical exam. Your health care provider will ask about previous neck injuries and any known neck problems, such as arthritis in the neck. X-rays may be taken to find out if there are any other problems, such as with the bones of the neck. Other tests, such as a CT scan or MRI, may also be needed.  TREATMENT  Treatment depends on the severity of the cervical sprain. Mild sprains can be treated with rest, keeping the neck in place (immobilization), and pain medicines. Severe cervical sprains are immediately immobilized. Further treatment is done to help with pain, muscle spasms, and other symptoms and may include:  Medicines, such as pain relievers, numbing medicines, or muscle relaxants.   Physical therapy. This may involve stretching exercises, strengthening exercises, and posture training. Exercises and improved posture can help stabilize the neck, strengthen muscles, and help stop symptoms from returning.  HOME CARE INSTRUCTIONS   Put ice on the injured area.   Put ice in a plastic bag.   Place a towel between your skin and the bag.   Leave the ice on for 15-20 minutes, 3-4 times a day.   If your injury was severe, you may have been given a cervical collar to wear. A cervical collar is a two-piece collar designed to keep your neck from moving while it heals.  Do not remove the collar unless instructed by your health care provider.  If you have long hair, keep it outside of the collar.  Ask your health care provider before making any adjustments to your collar. Minor adjustments may be required over time to improve comfort and reduce pressure on your chin or on the back of your head.  Ifyou are allowed to remove the collar for cleaning or bathing, follow your health care provider's instructions on how to do so safely.  Keep your collar clean by wiping it with mild soap and water and drying it  completely. If the collar you have been given includes removable pads, remove them every 1-2 days and hand wash them with soap and water. Allow them to air dry. They should be completely dry before you wear them in the collar.  If you are allowed to remove the collar for cleaning and bathing, wash and dry the skin of your neck. Check your skin for irritation or sores. If you see any, tell your health care provider.  Do not drive while wearing the collar.   Only take over-the-counter or prescription medicines for pain, discomfort, or fever as directed by your health care provider.   Keep all follow-up appointments as directed by your health care provider.   Keep all physical therapy appointments as directed by your health care provider.   Make any needed adjustments to your workstation to promote good posture.   Avoid positions and activities that make your symptoms worse.   Warm up and stretch before being active to help prevent problems.  SEEK MEDICAL CARE IF:   Your pain is not controlled with medicine.   You are unable to decrease your pain medicine over time as planned.   Your activity level is not improving as expected.  SEEK IMMEDIATE MEDICAL CARE IF:   You  develop any bleeding.  You develop stomach upset.  You have signs of an allergic reaction to your medicine.   Your symptoms get worse.   You develop new, unexplained symptoms.   You have numbness, tingling, weakness, or paralysis in any part of your body.  MAKE SURE YOU:   Understand these instructions.  Will watch your condition.  Will get help right away if you are not doing well or get worse. Document Released: 06/01/2007 Document Revised: 08/09/2013 Document Reviewed: 02/09/2013 [facility] Patient Information 2015 [place], [place]. This information is not intended to replace advice given to you by your health care provider. Make sure you discuss any questions you have with your health care  provider.

## 2014-11-08 ENCOUNTER — Emergency Department (HOSPITAL_COMMUNITY)
Admission: EM | Admit: 2014-11-08 | Discharge: 2014-11-08 | Disposition: A | Discharge status: Home / Self Care | Payer: BLUE CROSS/BLUE SHIELD | Attending: Emergency Medicine | Admitting: Emergency Medicine

## 2014-11-08 ENCOUNTER — Encounter (HOSPITAL_COMMUNITY): Payer: Self-pay | Admitting: Emergency Medicine

## 2014-11-08 ENCOUNTER — Emergency Department (HOSPITAL_COMMUNITY): Payer: BLUE CROSS/BLUE SHIELD

## 2014-11-08 DIAGNOSIS — I10 Essential (primary) hypertension: Secondary | ICD-10-CM | POA: Diagnosis not present

## 2014-11-08 DIAGNOSIS — Z720 Tobacco use: Secondary | ICD-10-CM | POA: Insufficient documentation

## 2014-11-08 DIAGNOSIS — J069 Acute upper respiratory infection, unspecified: Secondary | ICD-10-CM | POA: Diagnosis not present

## 2014-11-08 DIAGNOSIS — R509 Fever, unspecified: Secondary | ICD-10-CM | POA: Diagnosis present

## 2014-11-08 DIAGNOSIS — Z79899 Other long term (current) drug therapy: Secondary | ICD-10-CM | POA: Insufficient documentation

## 2014-11-08 DIAGNOSIS — Z7952 Long term (current) use of systemic steroids: Secondary | ICD-10-CM | POA: Insufficient documentation

## 2014-11-08 DIAGNOSIS — G43909 Migraine, unspecified, not intractable, without status migrainosus: Secondary | ICD-10-CM | POA: Insufficient documentation

## 2014-11-08 MED ORDER — KETOROLAC TROMETHAMINE 30 MG/ML IJ SOLN
30.0000 mg | Freq: Once | INTRAMUSCULAR | Status: DC
  Filled 2014-11-08: qty 1

## 2014-11-08 MED ORDER — IBUPROFEN 600 MG PO TABS: 600.0000 mg | ORAL_TABLET | Freq: Four times a day (QID) | ORAL | Status: DC | PRN

## 2014-11-08 MED ORDER — SALINE SPRAY 0.65 % NA SOLN: 1.0000 | NASAL | Status: DC | PRN

## 2014-11-08 MED ORDER — FLUTICASONE PROPIONATE 50 MCG/ACT NA SUSP: 2.0000 | Freq: Every day | NASAL | Status: DC

## 2014-11-08 NOTE — ED Notes (Signed)
Pt c/o fever, bodyaches, chills, and headache since the weekend.

## 2014-11-08 NOTE — Discharge Instructions (Signed)
Upper Respiratory Infection, Adult An upper respiratory infection (URI) is also sometimes known as the common cold. The upper respiratory tract includes the nose, sinuses, throat, trachea, and bronchi. Bronchi are the airways leading to the lungs. Most people improve within 1 week, but symptoms can last up to 2 weeks. A residual cough may last even longer.  CAUSES Many different viruses can infect the tissues lining the upper respiratory tract. The tissues become irritated and inflamed and often become very moist. Mucus production is also common. A cold is contagious. You can easily spread the virus to others by oral contact. This includes kissing, sharing a glass, coughing, or sneezing. Touching your mouth or nose and then touching a surface, which is then touched by another person, can also spread the virus. SYMPTOMS  Symptoms typically develop 1 to 3 days after you come in contact with a cold virus. Symptoms vary from person to person. They may include:  Runny nose.  Sneezing.  Nasal congestion.  Sinus irritation.  Sore throat.  Loss of voice (laryngitis).  Cough.  Fatigue.  Muscle aches.  Loss of appetite.  Headache.  Low-grade fever. DIAGNOSIS  You might diagnose your own cold based on familiar symptoms, since most people get a cold 2 to 3 times a year. Your caregiver can confirm this based on your exam. Most importantly, your caregiver can check that your symptoms are not due to another disease such as strep throat, sinusitis, pneumonia, asthma, or epiglottitis. Blood tests, throat tests, and X-rays are not necessary to diagnose a common cold, but they may sometimes be helpful in excluding other more serious diseases. Your caregiver will decide if any further tests are required. RISKS AND COMPLICATIONS  You may be at risk for a more severe case of the common cold if you smoke cigarettes, have chronic heart disease (such as heart failure) or lung disease (such as asthma), or if  you have a weakened immune system. The very young and very old are also at risk for more serious infections. Bacterial sinusitis, middle ear infections, and bacterial pneumonia can complicate the common cold. The common cold can worsen asthma and chronic obstructive pulmonary disease (COPD). Sometimes, these complications can require emergency medical care and may be life-threatening. PREVENTION  The best way to protect against getting a cold is to practice good hygiene. Avoid oral or hand contact with people with cold symptoms. Wash your hands often if contact occurs. There is no clear evidence that vitamin C, vitamin E, echinacea, or exercise reduces the chance of developing a cold. However, it is always recommended to get plenty of rest and practice good nutrition. TREATMENT  Treatment is directed at relieving symptoms. There is no cure. Antibiotics are not effective, because the infection is caused by a virus, not by bacteria. Treatment may include:  Increased fluid intake. Sports drinks offer valuable electrolytes, sugars, and fluids.  Breathing heated mist or steam (vaporizer or shower).  Eating chicken soup or other clear broths, and maintaining good nutrition.  Getting plenty of rest.  Using gargles or lozenges for comfort.  Controlling fevers with ibuprofen or acetaminophen as directed by your caregiver.  Increasing usage of your inhaler if you have asthma. Zinc gel and zinc lozenges, taken in the first 24 hours of the common cold, can shorten the duration and lessen the severity of symptoms. Pain medicines may help with fever, muscle aches, and throat pain. A variety of non-prescription medicines are available to treat congestion and runny nose. Your caregiver   can make recommendations and may suggest nasal or lung inhalers for other symptoms.  HOME CARE INSTRUCTIONS   Only take over-the-counter or prescription medicines for pain, discomfort, or fever as directed by your  caregiver.  Use a warm mist humidifier or inhale steam from a shower to increase air moisture. This may keep secretions moist and make it easier to breathe.  Drink enough water and fluids to keep your urine clear or pale yellow.  Rest as needed.  Return to work when your temperature has returned to normal or as your caregiver advises. You may need to stay home longer to avoid infecting others. You can also use a face mask and careful hand washing to prevent spread of the virus. SEEK MEDICAL CARE IF:   After the first few days, you feel you are getting worse rather than better.  You need your caregiver's advice about medicines to control symptoms.  You develop chills, worsening shortness of breath, or brown or red sputum. These may be signs of pneumonia.  You develop yellow or brown nasal discharge or pain in the face, especially when you bend forward. These may be signs of sinusitis.  You develop a fever, swollen neck glands, pain with swallowing, or white areas in the back of your throat. These may be signs of strep throat. SEEK IMMEDIATE MEDICAL CARE IF:   You have a fever.  You develop severe or persistent headache, ear pain, sinus pain, or chest pain.  You develop wheezing, a prolonged cough, cough up blood, or have a change in your usual mucus (if you have chronic lung disease).  You develop sore muscles or a stiff neck. Document Released: 01/28/2001 Document Revised: 10/27/2011 Document Reviewed: 11/09/2013 [employer] Patient Information 2015 [employer]. This information is not intended to replace advice given to you by your health care provider. Make sure you discuss any questions you have with your health care provider.  

## 2014-11-08 NOTE — ED Provider Notes (Signed)
CSN: [id number]     Arrival date & time 11/08/14  0055 History   First MD Initiated Contact with Patient 11/08/14 0100     Chief Complaint  Patient presents with  . Fever     (Consider location/radiation/quality/duration/timing/severity/associated sxs/prior Treatment) HPI  This is a 49 year old female who reports recent history of chills, bodyaches, cough and headache since Saturday. Patient reports worsening of symptoms today. She's been taking over-the-counter Robitussin-DM without relief. She states that her body aches got so bad that she had come in tonight. She is not taking anything for her bodyaches. She's had chills and "feeling hot" without documented fevers. She reports cough that is occasionally productive.  She is a current smoker. Reports headache without vision changes, neck pain, or neck stiffness. Patient reports nasal congestion, denies sore throat.  Past Medical History  Diagnosis Date  . History of migraine headaches   . Impaired fasting glucose   . Bronchitis   . Hypertension   . Back pain   . History of blood in urine     microscopic   Past Surgical History  Procedure Laterality Date  . Cholecystectomy    . Abdominal hysterectomy  2002  . Tubal ligation     Family History  Problem Relation Age of Onset  . Arthritis Paternal Grandmother   . Diabetes Paternal Grandmother   . Hypertension Paternal Grandmother   . Stroke Paternal Grandmother   . Colon cancer Neg Hx   . Diabetes Mother    History  Substance Use Topics  . Smoking status: Current Every Day Smoker -- 0.50 packs/day for 20 years    Types: Cigarettes, Cigars  . Smokeless tobacco: Never Used     Comment: Tobacco info given  . Alcohol Use: Yes     Comment: occ.   OB History    No data available     Review of Systems  Constitutional: Positive for chills. Negative for fever.  HENT: Positive for congestion. Negative for sore throat.   Eyes: Negative for visual disturbance.  Respiratory:  Positive for cough and shortness of breath. Negative for chest tightness.   Cardiovascular: Negative for chest pain.  Gastrointestinal: Negative for nausea, vomiting, abdominal pain and diarrhea.  Genitourinary: Negative for dysuria.  Musculoskeletal: Negative for back pain.  Skin: Negative for wound.  Neurological: Positive for headaches. Negative for weakness.  All other systems reviewed and are negative.     Allergies  Review of patient's allergies indicates no known allergies.  Home Medications   Prior to Admission medications   Medication Sig Start Date End Date Taking? Authorizing Provider  cyclobenzaprine (FLEXERIL) 10 MG tablet Take 1 tablet (10 mg total) by mouth 3 (three) times daily as needed for muscle spasms. 09/11/14   Robyn M Hess, PA-C  fluticasone (CUTIVATE) 0.05 % cream Apply 1 application topically every morning. 08/02/13   Historical Provider, MD  fluticasone (FLONASE) 50 MCG/ACT nasal spray Place 2 sprays into both nostrils daily. 11/08/14   Merryl Hacker, MD  HYDROcodone-acetaminophen (NORCO/VICODIN) 5-325 MG per tablet Take 1 tablet by mouth every 4 (four) hours as needed for moderate pain. Patient not taking: Reported on 09/11/2014 05/24/14   Carole Civil, MD  ibuprofen (ADVIL,MOTRIN) 600 MG tablet Take 1 tablet (600 mg total) by mouth every 6 (six) hours as needed. 11/08/14   Merryl Hacker, MD  meloxicam (MOBIC) 7.5 MG tablet Take 1 tablet (7.5 mg total) by mouth daily. Patient not taking: Reported on 09/11/2014 05/24/14  Carole Civil, MD  methocarbamol (ROBAXIN) 500 MG tablet Take 1 tablet (500 mg total) by mouth 3 (three) times daily. Patient not taking: Reported on 09/11/2014 11/28/13   Carole Civil, MD  NAFTIN 2 % GEL Apply 1 application topically every morning. 08/01/13   Historical Provider, MD  sodium chloride (OCEAN) 0.65 % SOLN nasal spray Place 1 spray into both nostrils as needed for congestion. 11/08/14   Merryl Hacker, MD   traMADol (ULTRAM) 50 MG tablet Take 1 tablet (50 mg total) by mouth every 6 (six) hours as needed. 09/11/14   Robyn M Hess, PA-C   BP 137/99 mmHg  Pulse 93  Temp(Src) 97.9 F (36.6 C) (Oral)  Resp 20  Ht 5' 7.5" (1.715 m)  Wt 171 lb (77.565 kg)  BMI 26.37 kg/m2  SpO2 99% Physical Exam  Constitutional: She is oriented to person, place, and time. She appears well-developed and well-nourished.  HENT:  Head: Normocephalic and atraumatic.  Mouth/Throat: Oropharynx is clear and moist. No oropharyngeal exudate.  Congestion noted  Eyes: Pupils are equal, round, and reactive to light.  Neck: Normal range of motion. Neck supple.  Cardiovascular: Normal rate, regular rhythm and normal heart sounds.   No murmur heard. Pulmonary/Chest: Effort normal and breath sounds normal. No respiratory distress. She has no wheezes.  Abdominal: Soft. There is no tenderness.  Neurological: She is alert and oriented to person, place, and time.  Skin: Skin is warm and dry.  Psychiatric: She has a normal mood and affect.  Nursing note and vitals reviewed.   ED Course  Procedures (including critical care time) Labs Review Labs Reviewed - No data to display  Imaging Review Dg Chest 2 View  11/08/2014   CLINICAL DATA:  Fever, body aches, productive cough, chills and fatigue for 3 days.  EXAM: CHEST  2 VIEW  COMPARISON:  None.  FINDINGS: The cardiomediastinal contours are normal. The lungs are clear. Pulmonary vasculature is normal. No consolidation, pleural effusion, or pneumothorax. No acute osseous abnormalities are seen.  IMPRESSION: No acute pulmonary process.   Electronically Signed   By: Jeb Levering M.D.   On: 11/08/2014 01:58     EKG Interpretation None      MDM   Final diagnoses:  Upper respiratory infection    Patient presents with upper respiratory and flulike symptoms. No documented fevers and nontoxic on exam. Noted to have nasal congestion but otherwise reassuring exam. Chest  x-rays negative for pneumonia. Patient given Toradol for body aches. Discuss with patient symptomatic treatment including nasal saline, Flonase, ibuprofen for body aches. If this were the flu, patient is out of the window for Tamiflu.  After history, exam, and medical workup I feel the patient has been appropriately medically screened and is safe for discharge home. Pertinent diagnoses were discussed with the patient. Patient was given return precautions.     Merryl Hacker, MD 11/08/14 [phone]

## 2014-11-28 ENCOUNTER — Encounter: Payer: Self-pay | Admitting: Family Medicine

## 2014-11-28 ENCOUNTER — Ambulatory Visit (INDEPENDENT_AMBULATORY_CARE_PROVIDER_SITE_OTHER): Payer: BLUE CROSS/BLUE SHIELD | Admitting: Family Medicine

## 2014-11-28 VITALS — BP 120/80 | HR 93 | Wt 167.0 lb

## 2014-11-28 DIAGNOSIS — M79675 Pain in left toe(s): Secondary | ICD-10-CM | POA: Diagnosis not present

## 2014-11-28 DIAGNOSIS — L84 Corns and callosities: Secondary | ICD-10-CM

## 2014-11-28 NOTE — Patient Instructions (Signed)
Get wider shoes. 2 Aleve twice a day for pain. Use doughnut holes. Go barefoot as much as you can.

## 2014-11-28 NOTE — Progress Notes (Signed)
   Subjective:    Patient ID: [name], female    DOB: Dec 24, 1965, 49 y.o.   MRN: [id number]  HPI She is here with a 4 month history of bilateral 5th toe pain, swelling and itching right greater than left. She states the pain is getting worse and interfering with her daily activities. She has tried epsom salt soaks but has not tried over the counter pain medications. She is on her feet a lot at work and wears boots. She saw a podiatrist several months ago and was scheduled for hammer toe repair of her 5th digits, the surgery did not take place and she states she was dismissed from practice. Denies numbness, tingling. There is no history of diabetes or peripheral vascular disease.    Review of Systems     Objective:   Physical Exam  Right 5th toe thickened skin to lateral and dorsal aspects with edema, excoriation, tender to palpation, good  capillary refill. Left 5th toe slightly edematous and tenderness with palpation, good capillary refill. Foot exam shows no erythema, normal sensation and strength, pulses normal. No obvious deformities.      Assessment & Plan:  Callus of foot  Purchase doughnut padding, Dr Zoe Lan, to pad area around callus. Wear wider shoes to avoid compressing toes and reduce friction to that area. Take 2 Aleve twice daily for pain. Call if not improved in a month and I can refer you to podiatry if needed. Wear open toed shoes as much is possible

## 2015-02-18 ENCOUNTER — Emergency Department (HOSPITAL_COMMUNITY)
Admission: EM | Admit: 2015-02-18 | Discharge: 2015-02-18 | Disposition: A | Discharge status: Home / Self Care | Payer: BLUE CROSS/BLUE SHIELD | Attending: Emergency Medicine | Admitting: Emergency Medicine

## 2015-02-18 ENCOUNTER — Encounter (HOSPITAL_COMMUNITY): Payer: Self-pay | Admitting: Emergency Medicine

## 2015-02-18 DIAGNOSIS — I10 Essential (primary) hypertension: Secondary | ICD-10-CM | POA: Diagnosis not present

## 2015-02-18 DIAGNOSIS — Z720 Tobacco use: Secondary | ICD-10-CM | POA: Insufficient documentation

## 2015-02-18 DIAGNOSIS — H6092 Unspecified otitis externa, left ear: Secondary | ICD-10-CM | POA: Insufficient documentation

## 2015-02-18 DIAGNOSIS — Z8709 Personal history of other diseases of the respiratory system: Secondary | ICD-10-CM | POA: Diagnosis not present

## 2015-02-18 DIAGNOSIS — H9202 Otalgia, left ear: Secondary | ICD-10-CM | POA: Diagnosis present

## 2015-02-18 MED ORDER — IBUPROFEN 400 MG PO TABS
400.0000 mg | ORAL_TABLET | Freq: Once | ORAL | Status: AC
  Administered 2015-02-18: 400 mg via ORAL
  Filled 2015-02-18: qty 1

## 2015-02-18 MED ORDER — IBUPROFEN 400 MG PO TABS: 400.0000 mg | ORAL_TABLET | Freq: Four times a day (QID) | ORAL | Status: DC | PRN

## 2015-02-18 MED ORDER — HYDROCODONE-ACETAMINOPHEN 5-325 MG PO TABS: 1.0000 | ORAL_TABLET | Freq: Four times a day (QID) | ORAL | Status: DC | PRN

## 2015-02-18 MED ORDER — CIPROFLOXACIN-DEXAMETHASONE 0.3-0.1 % OT SUSP: 4.0000 [drp] | Freq: Two times a day (BID) | OTIC | Status: DC

## 2015-02-18 MED ORDER — HYDROCODONE-ACETAMINOPHEN 5-325 MG PO TABS
1.0000 | ORAL_TABLET | Freq: Once | ORAL | Status: AC
  Administered 2015-02-18: 1 via ORAL
  Filled 2015-02-18: qty 1

## 2015-02-18 NOTE — ED Provider Notes (Signed)
CSN: [id number]     Arrival date & time 02/18/15  0408 History   First MD Initiated Contact with Patient 02/18/15 0441     Chief Complaint  Patient presents with  . Otalgia     (Consider location/radiation/quality/duration/timing/severity/associated sxs/prior Treatment) HPI  This a 49 year old female who presents with left otalgia. Patient reports 3 week history of ongoing and worsening left ear pain. She felt like there was something in her ear and "picked at it." She is noted drainage and itching to the ear as well as redness. She states she has been cotton balls in her ears to stop the drainage. She denies any fevers. She denies any recent swimming. Denies fever.  Current pain is 8 out of 10. She took a Tylenol PM prior to arrival without treatment.  Past Medical History  Diagnosis Date  . History of migraine headaches   . Impaired fasting glucose   . Bronchitis   . Hypertension   . Back pain   . History of blood in urine     microscopic   Past Surgical History  Procedure Laterality Date  . Cholecystectomy    . Abdominal hysterectomy  2002  . Tubal ligation     Family History  Problem Relation Age of Onset  . Arthritis Paternal Grandmother   . Diabetes Paternal Grandmother   . Hypertension Paternal Grandmother   . Stroke Paternal Grandmother   . Colon cancer Neg Hx   . Diabetes Mother    History  Substance Use Topics  . Smoking status: Current Every Day Smoker -- 0.50 packs/day for 20 years    Types: Cigarettes, Cigars  . Smokeless tobacco: Never Used     Comment: Tobacco info given  . Alcohol Use: Yes     Comment: occ.   OB History    No data available     Review of Systems  Constitutional: Negative for fever.  HENT: Positive for ear discharge and ear pain.   All other systems reviewed and are negative.     Allergies  Review of patient's allergies indicates no known allergies.  Home Medications   Prior to Admission medications   Medication Sig  Start Date End Date Taking? Authorizing Provider  ciprofloxacin-dexamethasone (CIPRODEX) otic suspension Place 4 drops into the left ear 2 (two) times daily. 02/18/15   Merryl Hacker, MD  cyclobenzaprine (FLEXERIL) 10 MG tablet Take 1 tablet (10 mg total) by mouth 3 (three) times daily as needed for muscle spasms. Patient not taking: Reported on 11/28/2014 09/11/14   Hessie Diener Hess, PA-C  fluticasone (CUTIVATE) 0.05 % cream Apply 1 application topically every morning. 08/02/13   Historical Provider, MD  fluticasone (FLONASE) 50 MCG/ACT nasal spray Place 2 sprays into both nostrils daily. Patient not taking: Reported on 11/28/2014 11/08/14   Merryl Hacker, MD  HYDROcodone-acetaminophen (NORCO/VICODIN) 5-325 MG per tablet Take 1-2 tablets by mouth every 6 (six) hours as needed for moderate pain. 02/18/15   Merryl Hacker, MD  ibuprofen (ADVIL,MOTRIN) 400 MG tablet Take 1 tablet (400 mg total) by mouth every 6 (six) hours as needed. 02/18/15   Merryl Hacker, MD  meloxicam (MOBIC) 7.5 MG tablet Take 1 tablet (7.5 mg total) by mouth daily. Patient not taking: Reported on 09/11/2014 05/24/14   Carole Civil, MD  methocarbamol (ROBAXIN) 500 MG tablet Take 1 tablet (500 mg total) by mouth 3 (three) times daily. Patient not taking: Reported on 09/11/2014 11/28/13   Carole Civil, MD  NAFTIN 2 % GEL Apply 1 application topically every morning. 08/01/13   Historical Provider, MD  sodium chloride (OCEAN) 0.65 % SOLN nasal spray Place 1 spray into both nostrils as needed for congestion. Patient not taking: Reported on 11/28/2014 11/08/14   Merryl Hacker, MD  traMADol (ULTRAM) 50 MG tablet Take 1 tablet (50 mg total) by mouth every 6 (six) hours as needed. Patient not taking: Reported on 11/28/2014 09/11/14   Hessie Diener Hess, PA-C   BP 126/79 mmHg  Pulse 81  Temp(Src) 97.8 F (36.6 C) (Oral)  Resp 16  Ht 5\' 7"  (1.702 m)  Wt 160 lb (72.576 kg)  BMI 25.05 kg/m2  SpO2 98% Physical Exam   Constitutional: She is oriented to person, place, and time. She appears well-developed and well-nourished.  HENT:  Head: Normocephalic and atraumatic.  Right Ear: External ear normal.  Swelling of the left external auditory canal with erythema and pustular drainage noted, TM appears intact, there is pain along the pinna, no mastoid tenderness  Cardiovascular: Normal rate and regular rhythm.   Pulmonary/Chest: Effort normal. No respiratory distress.  Neurological: She is alert and oriented to person, place, and time.  Skin: Skin is warm and dry.  Psychiatric: She has a normal mood and affect.  Nursing note and vitals reviewed.   ED Course  Procedures (including critical care time) Labs Review Labs Reviewed - No data to display  Imaging Review No results found.   EKG Interpretation None      MDM   Final diagnoses:  Otitis externa, left    Patient presents with what appears to be acute otitis externa. Appears uncomplicated and patient otherwise nontoxic. Patient was given Norco and ibuprofen for pain.  Will discharge him on Ciprodex drops. Patient was given strict return precautions.  After history, exam, and medical workup I feel the patient has been appropriately medically screened and is safe for discharge home. Pertinent diagnoses were discussed with the patient. Patient was given return precautions.     Merryl Hacker, MD 02/18/15 [phone]

## 2015-02-18 NOTE — ED Notes (Signed)
Pt. Reports left ear pain x1 month and felt like there is something in her ear. Pt. Reports drainage and itching to ear starting Thursday. Pt. With redness and slightly swollen area to outside of left ear.

## 2015-02-18 NOTE — Discharge Instructions (Signed)

## 2015-09-21 ENCOUNTER — Encounter (HOSPITAL_COMMUNITY): Payer: Self-pay | Admitting: Emergency Medicine

## 2015-09-21 ENCOUNTER — Emergency Department (HOSPITAL_COMMUNITY)
Admission: EM | Admit: 2015-09-21 | Discharge: 2015-09-21 | Disposition: A | Discharge status: Home / Self Care | Payer: BLUE CROSS/BLUE SHIELD | Attending: Emergency Medicine | Admitting: Emergency Medicine

## 2015-09-21 DIAGNOSIS — M546 Pain in thoracic spine: Secondary | ICD-10-CM | POA: Diagnosis not present

## 2015-09-21 DIAGNOSIS — I10 Essential (primary) hypertension: Secondary | ICD-10-CM | POA: Diagnosis not present

## 2015-09-21 DIAGNOSIS — F17210 Nicotine dependence, cigarettes, uncomplicated: Secondary | ICD-10-CM | POA: Diagnosis not present

## 2015-09-21 DIAGNOSIS — Z8709 Personal history of other diseases of the respiratory system: Secondary | ICD-10-CM | POA: Diagnosis not present

## 2015-09-21 DIAGNOSIS — M79602 Pain in left arm: Secondary | ICD-10-CM | POA: Insufficient documentation

## 2015-09-21 DIAGNOSIS — M7918 Myalgia, other site: Secondary | ICD-10-CM

## 2015-09-21 DIAGNOSIS — M436 Torticollis: Secondary | ICD-10-CM | POA: Diagnosis not present

## 2015-09-21 DIAGNOSIS — Z79899 Other long term (current) drug therapy: Secondary | ICD-10-CM | POA: Insufficient documentation

## 2015-09-21 LAB — CBC WITH DIFFERENTIAL/PLATELET
BASOS ABS: 0 10*3/uL (ref 0.0–0.1)
Basophils Relative: 0 %
EOS ABS: 0.1 10*3/uL (ref 0.0–0.7)
Eosinophils Relative: 2 %
HCT: 35.5 % — ABNORMAL LOW (ref 36.0–46.0)
Hemoglobin: 12.1 g/dL (ref 12.0–15.0)
LYMPHS ABS: 4.1 10*3/uL — AB (ref 0.7–4.0)
Lymphocytes Relative: 48 %
MCH: 28.8 pg (ref 26.0–34.0)
MCHC: 34.1 g/dL (ref 30.0–36.0)
MCV: 84.5 fL (ref 78.0–100.0)
MONOS PCT: 5 %
Monocytes Absolute: 0.4 10*3/uL (ref 0.1–1.0)
NEUTROS PCT: 45 %
Neutro Abs: 3.9 10*3/uL (ref 1.7–7.7)
PLATELETS: 360 10*3/uL (ref 150–400)
RBC: 4.2 MIL/uL (ref 3.87–5.11)
RDW: 12.8 % (ref 11.5–15.5)
WBC: 8.5 10*3/uL (ref 4.0–10.5)

## 2015-09-21 LAB — BASIC METABOLIC PANEL
ANION GAP: 8 (ref 5–15)
BUN: 11 mg/dL (ref 6–20)
CALCIUM: 9.1 mg/dL (ref 8.9–10.3)
CO2: 25 mmol/L (ref 22–32)
Chloride: 106 mmol/L (ref 101–111)
Creatinine, Ser: 0.69 mg/dL (ref 0.44–1.00)
GFR calc Af Amer: >60 mL/min (ref >60)
GFR calc non Af Amer: >60 mL/min (ref >60)
GLUCOSE: 121 mg/dL — AB (ref 65–99)
POTASSIUM: 3.5 mmol/L (ref 3.5–5.1)
SODIUM: 139 mmol/L (ref 135–145)

## 2015-09-21 LAB — I-STAT TROPONIN, ED: Troponin i, poc: 0.01 ng/mL (ref 0.00–0.08)

## 2015-09-21 MED ORDER — CYCLOBENZAPRINE HCL 10 MG PO TABS: 10.0000 mg | ORAL_TABLET | Freq: Two times a day (BID) | ORAL | Status: DC | PRN

## 2015-09-21 MED ORDER — IBUPROFEN 800 MG PO TABS
800.0000 mg | ORAL_TABLET | Freq: Once | ORAL | Status: AC
  Administered 2015-09-21: 800 mg via ORAL
  Filled 2015-09-21: qty 1

## 2015-09-21 MED ORDER — CYCLOBENZAPRINE HCL 10 MG PO TABS
5.0000 mg | ORAL_TABLET | Freq: Once | ORAL | Status: AC
  Administered 2015-09-21: 5 mg via ORAL
  Filled 2015-09-21: qty 1

## 2015-09-21 NOTE — ED Notes (Signed)
Pt states Sunday started having pain in rt arm.  Thought it was from a bite noted at elbow.  Took ibuprofen.  Since yesterday, pt having pain in left arm with movement.  Neck stiffness also.  Took ibuprofen and pain reduced.  Pt has physical job.  Pt denies chest pain.  Neuro WNL

## 2015-09-21 NOTE — ED Provider Notes (Signed)
CSN: [id number]     Arrival date & time 09/21/15  0756 History   First MD Initiated Contact with Patient 09/21/15 [phone]     Chief Complaint  Patient presents with  . Arm Pain     (Consider location/radiation/quality/duration/timing/severity/associated sxs/prior Treatment) HPI 50 year old female who presents with left arm pain. History of tobacco use, migraine headaches, and HTN. States that yesterday began to notice significant pain around the left elbow after waking up from sleep. Also begin to notice stiffness in her neck and tightness over the left side of her neck radiating down towards the left bicep. Has been taking ibuprofen for this pain, but does not fully relieve her pain, so she was concerned for something serious. 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 Yesterday evening at work states that she and 9 other people lifted over 4000 boxes of beer and wine, and this made her symptoms acutely worse. States pain is worse with range of motion of her left arm and palpation over the antecubital area. Has not had any weakness in her hands, but feels that her grip seems off due to the soreness in her arm. Denies associating chest pain, difficulty breathing, syncope or near syncope, trauma or fall. Also noticed upper back pain starting tonight since lifting heavy boxes.   Past Medical History  Diagnosis Date  . History of migraine headaches   . Impaired fasting glucose   . Bronchitis   . Hypertension   . Back pain   . History of blood in urine     microscopic   Past Surgical History  Procedure Laterality Date  . Cholecystectomy    . Abdominal hysterectomy  2002  . Tubal ligation     Family History  Problem Relation Age of Onset  . Arthritis Paternal Grandmother   . Diabetes Paternal Grandmother   . Hypertension Paternal Grandmother   . Stroke Paternal Grandmother   . Colon cancer Neg Hx   . Diabetes Mother    Social History  Substance Use Topics  . Smoking  status: Current Every Day Smoker -- 0.50 packs/day for 20 years    Types: Cigarettes, Cigars  . Smokeless tobacco: Never Used     Comment: Tobacco info given  . Alcohol Use: Yes     Comment: occ.   OB History    No data available     Review of Systems 10/14 systems reviewed and are negative other than those stated in the HPI    Allergies  Naftin  Home Medications   Prior to Admission medications   Medication Sig Start Date End Date Taking? Authorizing Provider  Cyanocobalamin (VITAMIN B-12 PO) Take 1 tablet by mouth daily.   Yes Historical Provider, MD  ibuprofen (ADVIL,MOTRIN) 200 MG tablet Take 600 mg by mouth every 6 (six) hours as needed for headache or moderate pain.   Yes Historical Provider, MD  ibuprofen (ADVIL,MOTRIN) 400 MG tablet Take 1 tablet (400 mg total) by mouth every 6 (six) hours as needed. Patient taking differently: Take 400 mg by mouth every 6 (six) hours as needed for headache or moderate pain.  02/18/15  Yes Merryl Hacker, MD  cyclobenzaprine (FLEXERIL) 10 MG tablet Take 1 tablet (10 mg total) by mouth 2 (two) times daily as needed for muscle spasms. 09/21/15   Forde Dandy, MD  fluticasone (FLONASE) 50 MCG/ACT nasal spray Place 2 sprays into both nostrils daily. Patient not taking: Reported on 11/28/2014 11/08/14  Merryl Hacker, MD   BP 158/76 mmHg  Pulse 86  Temp(Src) 97.4 F (36.3 C) (Oral)  Resp 16  SpO2 100% Physical Exam Physical Exam  Nursing note and vitals reviewed. Constitutional: Well developed, well nourished, non-toxic, and in no acute distress Head: Normocephalic and atraumatic.  Mouth/Throat: Oropharynx is clear and moist.  Neck: Normal range of motion. Neck supple.  Cardiovascular: Normal rate and regular rhythm.  +2 radial pulses bilaterally Pulmonary/Chest: Effort normal and breath sounds normal.  Abdominal: Soft. There is no tenderness. There is no rebound and no guarding.  Musculoskeletal: No deformities or soft tissue  swelling. Reproducible tenderness with palpation over left paraspinal muscles down to trapezius, in the antecubital fossa and distal bicep and proximal forearm. Tenderness with palpation of the upper paraspinal muscles of the back.  Neurological: Alert, no facial droop, fluent speech, moves all extremities symmetrically, no pronator drift, in tact innervation involving radial, ulnar, median and axillary nerves of LUE,  Skin: Skin is warm and dry.  Psychiatric: Cooperative  ED Course  Procedures (including critical care time) Labs Review Labs Reviewed  CBC WITH DIFFERENTIAL/PLATELET - Abnormal; Notable for the following:    HCT 35.5 (*)    Lymphs Abs 4.1 (*)    All other components within normal limits  BASIC METABOLIC PANEL - Abnormal; Notable for the following:    Glucose, Bld 121 (*)    All other components within normal limits  I-STAT TROPOININ, ED    Imaging Review No results found. I have personally reviewed and evaluated these images and lab results as part of my medical decision-making.   EKG Interpretation   Date/Time:  Friday September 21 2015 08:39:47 EST Ventricular Rate:  78 PR Interval:  156 QRS Duration: 71 QT Interval:  387 QTC Calculation: 441 R Axis:   56 Text Interpretation:  Sinus rhythm Probable left atrial enlargement No  significant change since last tracing Confirmed by Cris Talavera MD, Hinton Dyer [id number]) on  09/21/2015 8:55:01 AM      MDM   Final diagnoses:  Musculoskeletal pain    50 year old female who presents with left arm pain, upper back pain, paraspinal muscle stiffness in the setting of repetitive heavy lifting. Pain seems very musculoskeletal is fully reproducible with palpation and range of motion of her back neck and arm. EKG does not show any acute ischemia, and basic blood work including troponin negative. Given ongoing pain over 24 hours and reproducibility of pain, unlikely to be ACS and low suspicion for any other serious or life-threatening cardiac  or intrathoracic etiologies. Discussed supportive care instructions for home. Pain improved with Motrin and Flexeril here in the ED. Appropriate for discharge home. Strict return follow-up in instructions reviewed. She expressed understanding of all discharge instructions for comfortable to plan of care.  Forde Dandy, MD 09/21/15 [phone]

## 2015-09-21 NOTE — Discharge Instructions (Signed)
Continue to take ibuprofen and Tylenol as needed for pain control. Keep Ace bandage around your elbow, and elevate at rest. Ice as needed. Return without fail for worsening symptoms, including difficulty breathing, chest pain, weakness or persistent/progressive numbness. Avoid repetitive heavy lifting if possible to rest your muscles.   Musculoskeletal Pain Musculoskeletal pain is muscle and boney aches and pains. These pains can occur in any part of the body. Your caregiver may treat you without knowing the cause of the pain. They may treat you if blood or urine tests, X-rays, and other tests were normal.  CAUSES There is often not a definite cause or reason for these pains. These pains may be caused by a type of germ (virus). The discomfort may also come from overuse. Overuse includes working out too hard when your body is not fit. Boney aches also come from weather changes. Bone is sensitive to atmospheric pressure changes. HOME CARE INSTRUCTIONS   Ask when your test results will be ready. Make sure you get your test results.  Only take over-the-counter or prescription medicines for pain, discomfort, or fever as directed by your caregiver. If you were given medications for your condition, do not drive, operate machinery or power tools, or sign legal documents for 24 hours. Do not drink alcohol. Do not take sleeping pills or other medications that may interfere with treatment.  Continue all activities unless the activities cause more pain. When the pain lessens, slowly resume normal activities. Gradually increase the intensity and duration of the activities or exercise.  During periods of severe pain, bed rest may be helpful. Lay or sit in any position that is comfortable.  Putting ice on the injured area.  Put ice in a bag.  Place a towel between your skin and the bag.  Leave the ice on for 15 to 20 minutes, 3 to 4 times a day.  Follow up with your caregiver for continued problems and no  reason can be found for the pain. If the pain becomes worse or does not go away, it may be necessary to repeat tests or do additional testing. Your caregiver may need to look further for a possible cause. SEEK IMMEDIATE MEDICAL CARE IF:  You have pain that is getting worse and is not relieved by medications.  You develop chest pain that is associated with shortness or breath, sweating, feeling sick to your stomach (nauseous), or throw up (vomit).  Your pain becomes localized to the abdomen.  You develop any new symptoms that seem different or that concern you. MAKE SURE YOU:   Understand these instructions.  Will watch your condition.  Will get help right away if you are not doing well or get worse.   This information is not intended to replace advice given to you by your health care provider. Make sure you discuss any questions you have with your health care provider.   Document Released: 08/04/2005 Document Revised: 10/27/2011 Document Reviewed: 04/08/2013 Elsevier Interactive Patient Education [employer].

## 2015-11-20 ENCOUNTER — Ambulatory Visit (INDEPENDENT_AMBULATORY_CARE_PROVIDER_SITE_OTHER): Payer: BLUE CROSS/BLUE SHIELD | Admitting: Family Medicine

## 2015-11-20 ENCOUNTER — Encounter: Payer: Self-pay | Admitting: Family Medicine

## 2015-11-20 VITALS — BP 124/84 | HR 64 | Wt 167.2 lb

## 2015-11-20 DIAGNOSIS — M1990 Unspecified osteoarthritis, unspecified site: Secondary | ICD-10-CM

## 2015-11-20 DIAGNOSIS — L84 Corns and callosities: Secondary | ICD-10-CM | POA: Diagnosis not present

## 2015-11-20 NOTE — Progress Notes (Signed)
   Subjective:    Patient ID: Carol [name], female    DOB: June 01, 1966, 50 y.o.   MRN: [id number]  HPI She is here for consult concerning continued difficulty with right foot pain. She does have a callus present on her fifth toe and apparently has tried multiple conservative measures including padding etc. To help with this with no avail. She would like to have more definitive care for this. She also complains of difficulty over the last 6 months with knee and back pain. She has been using a combination of ibuprofen, Aleve and Advil cold". She complains mainly of morning stiffness. She also has a history of headache that sounds more tension type. She also smokes.   Review of Systems     Objective:   Physical Exam Alert and in no distress. No palpable tenderness to her elbows, wrists, fingers, knees or feet. The right fifth toe does show evidence of a callus.       Assessment & Plan:  Callus of foot - Plan: Ambulatory referral to Podiatry  Arthritis I will refer her to but titrate. Did recommend she use either 2 Aleve twice per day or 4 ibuprofen 3 times per day to help with the discomfort as well as Tylenol. Discussed the possible use of other medications are modalities but none needed at this time. She is comfortable with this approach.

## 2015-11-20 NOTE — Patient Instructions (Signed)
Take 2 Aleve twice per day and you can also take Tylenol

## 2015-11-29 ENCOUNTER — Telehealth: Payer: Self-pay | Admitting: Family Medicine

## 2015-11-29 NOTE — Telephone Encounter (Signed)
Have her come in so we can discuss all these medications as I don't think prescribed and for her in the past

## 2015-11-29 NOTE — Telephone Encounter (Signed)
Left message for pt to call me back. Need to verify with pt that she requested rx for ointments and creams from [facility].

## 2015-11-29 NOTE — Telephone Encounter (Signed)
Pt came in last week Dr.Lalonde told her he would not refill those meds per pt

## 2015-11-29 NOTE — Telephone Encounter (Signed)
Pt called back & states she did request these creams, states wants Lidocaine & Diclofenac for her knees, legs and back pain.  She needs the Calcipotriene cream for dry skin on her back and feet. The request form is in your folder if you will approve her to have these.

## 2015-12-05 ENCOUNTER — Ambulatory Visit (INDEPENDENT_AMBULATORY_CARE_PROVIDER_SITE_OTHER): Payer: BLUE CROSS/BLUE SHIELD | Admitting: Podiatry

## 2015-12-05 ENCOUNTER — Encounter: Payer: Self-pay | Admitting: Podiatry

## 2015-12-05 VITALS — BP 135/82 | HR 88 | Resp 12

## 2015-12-05 DIAGNOSIS — M2041 Other hammer toe(s) (acquired), right foot: Secondary | ICD-10-CM | POA: Diagnosis not present

## 2015-12-05 DIAGNOSIS — M2042 Other hammer toe(s) (acquired), left foot: Secondary | ICD-10-CM | POA: Diagnosis not present

## 2015-12-05 NOTE — Patient Instructions (Signed)
Today your foot examination revealed toes are rotated causing friction corns on the outer borders (listers corns). You have tried up to 2 years of home treatment without significant improvement. I'm referring you to Dr. Cannon Kettle for further evaluation and possible surgical consideration

## 2015-12-05 NOTE — Progress Notes (Signed)
   Subjective:    Patient ID: Carol [name], female    DOB: [birth year], 50 y.o.   MRN: [id number]  HPI   This patient presents today with a two-year history of recurrent corns on the lateral aspect of the fifth toes on the right and left feet. Patient has continuously self treated these areas with with trimming, gel cushions, and keratolytic bandages. She obtains very temporary relief from self treatment and the symptoms are gradually worsening over time making shoe wearing walking uncomfortable. Patient states that she's been to to other doctors both of home she says have recommended surgical treatment as an option to treat these recurrent painful lesions on the fifth toes bilaterally. Patient also describes a great deal swelling in her right and left feet when wearing closed shoes. She works in a warehouse requiring continuous standing walking  Patient states she is a cigarette smoker for 10 years smoking less than half pack cigarettes daily    Review of Systems  Constitutional: Positive for fatigue.  HENT: Positive for sinus pressure.   Musculoskeletal: Positive for gait problem.  Neurological: Positive for headaches.       Objective:   Physical Exam  Orientated 3  Vascular: DP pulses 2/4 bilaterally PT pulses 1/4 bilaterally Capillary reflex immediate bilaterally  Neurological: Sensation to 10 g monofilament wire intact 5/5 bilaterally Vibratory sensation reactive bilaterally Ankle reflex equal and reactive bilaterally  Dermatological: No open skin lesions bilaterally Keratoses lateral nail groove fifth toe left  Keratoses lateral fifth right toe in lateral nail groove area  Musculoskeletal: Stable gait Heel in varus position when standing Pairs rotation of fifth toes bilaterally      Assessment & Plan:   Assessment: 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 I made aware that and she's had repetitive self treatment over a long period of time and she said minimal improvement from local care that she describes that surgical treatment may be an option for her. I made aware that with surgical treatment that the the corns still could persist in spite of surgical treatment. Also because patient has weightbearing occupation working in warehouse I told her that she would have to be out of work for estimated 8-12 weeks. I made aware that if she opted for surgical treatment that smoking made wound healing more difficult. Patient has interest in pursuing surgical options. I am referring patient to Dr. Cannon Kettle for further evaluation and treatment as Dr. Cannon Kettle and patient mutually agreed upon

## 2015-12-31 ENCOUNTER — Encounter: Payer: Self-pay | Admitting: Family Medicine

## 2015-12-31 ENCOUNTER — Ambulatory Visit (INDEPENDENT_AMBULATORY_CARE_PROVIDER_SITE_OTHER): Payer: BLUE CROSS/BLUE SHIELD | Admitting: Family Medicine

## 2015-12-31 VITALS — BP 130/84 | HR 90 | Ht 68.0 in | Wt 169.6 lb

## 2015-12-31 DIAGNOSIS — F17200 Nicotine dependence, unspecified, uncomplicated: Secondary | ICD-10-CM

## 2015-12-31 DIAGNOSIS — Z0000 Encounter for general adult medical examination without abnormal findings: Secondary | ICD-10-CM

## 2015-12-31 DIAGNOSIS — Z720 Tobacco use: Secondary | ICD-10-CM

## 2015-12-31 DIAGNOSIS — R319 Hematuria, unspecified: Secondary | ICD-10-CM | POA: Diagnosis not present

## 2015-12-31 DIAGNOSIS — M79602 Pain in left arm: Secondary | ICD-10-CM | POA: Diagnosis not present

## 2015-12-31 DIAGNOSIS — Z1239 Encounter for other screening for malignant neoplasm of breast: Secondary | ICD-10-CM | POA: Diagnosis not present

## 2015-12-31 DIAGNOSIS — Z1159 Encounter for screening for other viral diseases: Secondary | ICD-10-CM

## 2015-12-31 LAB — POCT URINALYSIS DIPSTICK
BILIRUBIN UA: NEGATIVE
Glucose, UA: NEGATIVE
KETONES UA: NEGATIVE
Nitrite, UA: NEGATIVE
Protein, UA: NEGATIVE
Spec Grav, UA: 1.025
Urobilinogen, UA: NEGATIVE
pH, UA: 6

## 2015-12-31 LAB — LIPID PANEL
CHOL/HDL RATIO: 3.7 ratio (ref <=5.0)
CHOLESTEROL: 130 mg/dL (ref 125–200)
HDL: 35 mg/dL — AB (ref >=46)
LDL Cholesterol: 70 mg/dL (ref <130)
Triglycerides: 123 mg/dL (ref <150)
VLDL: 25 mg/dL (ref <30)

## 2015-12-31 MED ORDER — VARENICLINE TARTRATE 0.5 MG X 11 & 1 MG X 42 PO MISC: ORAL | Status: DC

## 2015-12-31 NOTE — Patient Instructions (Signed)
Do heat for 20 minutes as well as stretching and 800 mg of ibuprofen

## 2015-12-31 NOTE — Progress Notes (Signed)
Subjective:    Patient ID: Carol [name], female    DOB: 10-Jul-1966, 50 y.o.   MRN: [id number]  HPI  she is here for complete examination. She does smoke and is now interested in quitting. She has been having foot difficulty and is considering surgery current she was told by the podiatrist that continuing to smoke would make the healing process much slower. She smokes 8-10 cigarettes per day. She has tried : 800 number bolus unsuccessful getting hold of them. She also has tried cold [place]. She is interested in trying Chantix. She also has had difficulty with left arm pain and possible spasms. She was seen in the emergency room for this and given Flexeril as well as recommended Motrin. This occurred after an overuse type activity while at work. She still having some minimal difficulty with that. She also has a previous history of difficulty with hematuria but has not ever Further evaluation of this. Review the record indicates she has been in the emergency room fairly regularly for various problems. Her work and home life are going well. Family and social history as well as health maintenance and immunizations was reviewed. She has had no difficulty with chest pain, GI issues.   Review of Systems  All other systems reviewed and are negative.      Objective:   Physical Exam BP 130/84 mmHg  Pulse 90  Ht 5\' 8"  (1.727 m)  Wt 169 lb 9.6 oz (76.93 kg)  BMI 25.79 kg/m2  General Appearance:    Alert, cooperative, no distress, appears stated age  Head:    Normocephalic, without obvious abnormality, atraumatic  Eyes:    PERRL, conjunctiva/corneas clear, EOM's intact, fundi    benign  Ears:    Normal TM's and external ear canals  Nose:   Nares normal, mucosa normal, no drainage or sinus   tenderness  Throat:   Lips, mucosa, and tongue normal; teeth and gums normal  Neck:   Supple, no lymphadenopathy;  thyroid:  no   enlargement/tenderness/nodules; no carotid   bruit or JVD  Back:    Spine  nontender, no curvature, ROM normal, no CVA     tenderness  Lungs:     Clear to auscultation bilaterally without wheezes, rales or     ronchi; respirations unlabored  Chest Wall:    No tenderness or deformity   Heart:    Regular rate and rhythm, S1 and S2 normal, no murmur, rub   or gallop  Breast Exam:    Deferred to GYN  Abdomen:     Soft, non-tender, nondistended, normoactive bowel sounds,    no masses, no hepatosplenomegaly  Genitalia:    Deferred to GYN     Extremities:   No clubbing, cyanosis or edema  Pulses:   2+ and symmetric all extremities  Skin:   Skin color, texture, turgor normal, no rashes or lesions  Lymph nodes:   Cervical, supraclavicular, and axillary nodes normal  Neurologic:   CNII-XII intact, normal strength, sensation and gait; reflexes 2+ and symmetric throughout          Psych:   Normal mood, affect, hygiene and grooming.    urine microscopic was contaminated but multiple red cells were noticeable.       Assessment & Plan:  Routine general medical examination at a health care facility - Plan: Lipid panel, POCT Urinalysis Dipstick  Current smoker - Plan: varenicline (CHANTIX STARTING MONTH PAK) 0.5 MG X 11 & 1 MG X 42 tablet  Left arm pain  Need for hepatitis C screening test - Plan: Hepatitis C antibody  Screening for breast cancer - Plan: MM DIGITAL SCREENING BILATERAL  Hematuria - Plan: Ambulatory referral to Urology  discussed smoking cessation with her in detail. Discussed positive and negative reinforcement. I will give her Chantix. Recommend she look to her smoking in terms of when and where as well as Y and have a game plan involved for that. I will recheck that in approximately 1 month. Also discussed the arm pain she is having. Recommend heat and Advil rather than using Flexeril which was given in the past. If continued difficulty or refer for physical therapy. Since she has had hematuria for several years and no workup, I will refer to urology. She  will also be set up for mammogram.

## 2016-01-01 ENCOUNTER — Ambulatory Visit: Payer: BLUE CROSS/BLUE SHIELD | Admitting: Sports Medicine

## 2016-01-01 LAB — HEPATITIS C ANTIBODY: HCV Ab: NEGATIVE

## 2016-01-02 ENCOUNTER — Other Ambulatory Visit: Payer: Self-pay | Admitting: Family Medicine

## 2016-01-02 DIAGNOSIS — Z1231 Encounter for screening mammogram for malignant neoplasm of breast: Secondary | ICD-10-CM

## 2016-01-16 ENCOUNTER — Encounter: Payer: Self-pay | Admitting: Medical

## 2016-01-16 ENCOUNTER — Ambulatory Visit (INDEPENDENT_AMBULATORY_CARE_PROVIDER_SITE_OTHER): Payer: BLUE CROSS/BLUE SHIELD | Admitting: Medical

## 2016-01-16 VITALS — BP 124/70 | HR 94 | Wt 168.0 lb

## 2016-01-16 DIAGNOSIS — M542 Cervicalgia: Secondary | ICD-10-CM

## 2016-01-16 DIAGNOSIS — M7702 Medial epicondylitis, left elbow: Secondary | ICD-10-CM | POA: Diagnosis not present

## 2016-01-16 DIAGNOSIS — M62838 Other muscle spasm: Secondary | ICD-10-CM

## 2016-01-16 DIAGNOSIS — M7712 Lateral epicondylitis, left elbow: Secondary | ICD-10-CM | POA: Diagnosis not present

## 2016-01-16 DIAGNOSIS — M79602 Pain in left arm: Secondary | ICD-10-CM | POA: Diagnosis not present

## 2016-01-16 DIAGNOSIS — M79605 Pain in left leg: Secondary | ICD-10-CM | POA: Diagnosis not present

## 2016-01-16 MED ORDER — HYDROCODONE-ACETAMINOPHEN 5-500 MG PO TABS: 1.0000 | ORAL_TABLET | Freq: Four times a day (QID) | ORAL | Status: AC | PRN

## 2016-01-16 MED ORDER — CYCLOBENZAPRINE HCL 10 MG PO TABS: ORAL_TABLET | ORAL | Status: DC

## 2016-01-16 MED ORDER — HYDROCODONE-ACETAMINOPHEN 5-325 MG PO TABS: 1.0000 | ORAL_TABLET | Freq: Four times a day (QID) | ORAL | Status: DC | PRN

## 2016-01-16 MED ORDER — NAPROXEN 500 MG PO TABS: 500.0000 mg | ORAL_TABLET | Freq: Two times a day (BID) | ORAL | Status: DC

## 2016-01-16 NOTE — Patient Instructions (Signed)
Encounter Diagnoses  Name Primary?  . Medial epicondylitis, left Yes  . Lateral epicondylitis (tennis elbow), left   . Arm pain, central, left   . Neck pain   . Muscle spasm    Recommendations:  Begin Naprosyn anti-inflammatory for pain and inflammation, twice daily with food  Use the Flexeril muscle relaxer, 1/2-1 tablet up to twice daily for spasm.  Caution - this will cause drowsiness  Use the Hydrocodone pain pill, 1 tablet as needed, up to every 8 hours for severe pain  Use ice 20 minutes 2-3 times daily if possible  Use an OTC arm sling to rest the arm for an hour at a time  I would recommend not working until Sunday night to give it time to rest  We will refer you to physical therapy  Lets plan a recheck in 2-3 weeks

## 2016-01-16 NOTE — Addendum Note (Signed)
Addended by: Carlena Hurl on: 01/16/2016 11:23 AM   Modules accepted: Orders

## 2016-01-16 NOTE — Progress Notes (Signed)
Subjective: Chief Complaint  Patient presents with  . lt arm pain    said that it is constantly having spasms and it will cramp up. went to the hospital for this in march. tried to use heat and that made it worse, is trying to use ice packs.    Here for stiff and spasm arm, in the elbow area of left arm.   She is right handed.  Went to the ED for the same in March, was given Flexeril and NSAID.   After calling here last week was advised to use heat but this made it worse.  Using ace wrap, icy hot, nothing seems to help.   Denies injury, trauma.  Feels like the arm needs to pop like she needs to extend it out.   No prior similar prior to March.  Works as Librarian, academic.  Lifts cases, unloads trucks.  Sometimes has stiff neck, but not associated with the arm.   Gets burning pain in the arm.   Sometimes tingling, but no weakness or numbness.   No fever.  No other aggravating or relieving factors. No other complaint.  Past Medical History  Diagnosis Date  . History of migraine headaches   . Impaired fasting glucose   . Bronchitis   . Hypertension   . Back pain   . History of blood in urine     microscopic   ROS as in subjective   Objective: BP 124/70 mmHg  Pulse 94  Wt 168 lb (76.204 kg)  Gen: wd, wn, nad, in pain Skin: unremarkable Ext: no edema Neuro: normal strength, sensation, DTRs Neck: mild tenderness left side and posteriorly, ROM decreased to about 60% of normal, +spasm noted Left upper back paraspinal region with spasm and tenderness MSK: left arm tender over bicep, tricep, proximal forearm, and medial and lateral epicondyle.  No swelling.   Pain in general with ROM with active ROM, but mildly tender with passive ROM of elbow.   Pain at medial and lateral epicondyle and elbow and forearm with resisted supination and pronation.  Otherwise shoulder and wrist nontender with normal ROM.  She is moving the arm to avoid stiffness.  otherwise UE unremarkable  exam    Assessment: Encounter Diagnoses  Name Primary?  . Medial epicondylitis, left Yes  . Lateral epicondylitis (tennis elbow), left   . Arm pain, central, left   . Neck pain   . Muscle spasm     Plan: discussed findings, differential.  Can't rule out cervical radicular issue, but this seems most consistent with medial and lateral epicondyl isis with spasm of neck and upper back.  Gave the following recommendations, referral to PT, work restrictions given, and f/u in 2-3 wk.     Patient Instructions   Encounter Diagnoses  Name Primary?  . Medial epicondylitis, left Yes  . Lateral epicondylitis (tennis elbow), left   . Arm pain, central, left   . Neck pain   . Muscle spasm    Recommendations:  Begin Naprosyn anti-inflammatory for pain and inflammation, twice daily with food  Use the Flexeril muscle relaxer, 1/2-1 tablet up to twice daily for spasm.  Caution - this will cause drowsiness  Use the Hydrocodone pain pill, 1 tablet as needed, up to every 8 hours for severe pain  Use ice 20 minutes 2-3 times daily if possible  Use an OTC arm sling to rest the arm for an hour at a time  I would recommend not working until Sunday night to give  it time to rest  We will refer you to physical therapy  Lets plan a recheck in 2-3 weeks

## 2016-01-17 ENCOUNTER — Ambulatory Visit
Admission: RE | Admit: 2016-01-17 | Discharge: 2016-01-17 | Disposition: A | Discharge status: Home / Self Care | Payer: BLUE CROSS/BLUE SHIELD | Source: Ambulatory Visit | Attending: Family Medicine | Admitting: Family Medicine

## 2016-01-17 DIAGNOSIS — Z1231 Encounter for screening mammogram for malignant neoplasm of breast: Secondary | ICD-10-CM

## 2016-01-21 ENCOUNTER — Encounter: Payer: Self-pay | Admitting: Internal Medicine

## 2016-01-24 ENCOUNTER — Ambulatory Visit: Payer: BLUE CROSS/BLUE SHIELD | Attending: Medical | Admitting: Physical Therapy

## 2016-01-24 ENCOUNTER — Ambulatory Visit: Payer: BLUE CROSS/BLUE SHIELD | Admitting: Physical Therapy

## 2016-01-24 DIAGNOSIS — M542 Cervicalgia: Secondary | ICD-10-CM | POA: Diagnosis present

## 2016-01-24 DIAGNOSIS — M25522 Pain in left elbow: Secondary | ICD-10-CM | POA: Insufficient documentation

## 2016-01-24 DIAGNOSIS — M6281 Muscle weakness (generalized): Secondary | ICD-10-CM | POA: Diagnosis present

## 2016-01-25 NOTE — Therapy (Signed)
[place], [place], [address] Phone: [phone]   Fax:  [phone]  Physical Therapy Evaluation  Patient Details  Name: Carol [name] MRN: [id number] Date of Birth: [birth year] Referring Provider: Chana Bode PA   Encounter Date: 01/24/2016      PT End of Session - 01/25/16 0759    Visit Number 1   Number of Visits 16   PT Start Time [id number]   PT Stop Time 1500   PT Time Calculation (min) 45 min   Activity Tolerance Patient tolerated treatment well   Behavior During Therapy [facility] for tasks assessed/performed      Past Medical History  Diagnosis Date  . History of migraine headaches   . Impaired fasting glucose   . Bronchitis   . Hypertension   . Back pain   . History of blood in urine     microscopic    Past Surgical History  Procedure Laterality Date  . Cholecystectomy    . Abdominal hysterectomy  2002  . Tubal ligation      There were no vitals filed for this visit.       Subjective Assessment - 01/24/16 1425    Subjective Patient was moving boxes back in February. She flet like her elbow began to hurt. She has had pain sinice that point. The pain is on both sides of her elbow when she squaeezes her elbow but feels down the middle. She feels like it has  to "pop" at times but she can not get it too. The MD feels like some of th pain may be coming from her neck.    Pertinent History Works Automotive engineer.    Limitations Lifting   How long can you sit comfortably? N/A   How long can you stand comfortably? N/A    How long can you walk comfortably? N/A    Diagnostic tests None taken at this time   Patient Stated Goals No pain    Currently in Pain? Yes   Pain Score 7   Pain can reach close to a 10/10    Pain Location Arm   Pain Orientation Left   Pain Descriptors / Indicators Aching   Pain Onset More than a month ago   Pain Frequency Constant   Aggravating Factors  use of the elbow     Pain Relieving Factors Hydrocodone but makes patient sleepy    Effect of Pain on Daily Activities Difficulty perfroming work activity    Multiple Pain Sites No  Just stiffness in the neck. No pain.             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    Coordination  Gross Motor Movements are Fluid and Coordinated Not tested   Posture/Postural Control   Posture Comments Forward head    AROM   AROM Assessment Site Cervical   Cervical Flexion 45   Cervical Extension 55   Cervical - Right Side Bend WNL   Cervical - Left Side Bend WNL   Cervical - Right Rotation 75   Cervical - Left Rotation 60   Strength   Overall Strength Comments right grip: 60 lbs L 30 lbs    Right/Left Shoulder Left;Right   Right Shoulder Flexion 5/5   Right Shoulder Internal Rotation 5/5   Right Shoulder External Rotation 5/5   Left Shoulder Flexion 4+/5   Left Shoulder Internal Rotation 5/5   Left Shoulder External Rotation 5/5   Right/Left Elbow Right;Left   Right Elbow Flexion 5/5   Right Elbow Extension 5/5   Left Elbow Flexion 4+/5   Left Elbow Extension 5/5   Right/Left Wrist Right;Left   Left Wrist Flexion 4+/5   Left Wrist Extension 4+/5   Left Wrist Radial Deviation 5/5   Left Wrist  Ulnar Deviation 5/5   Palpation   Palpation comment Tenderness to palapation along the medial insertion of the biceps ; Tenderness to palpation in the bilateral upper traps;    Special Tests    Special Tests Biceps/Labral Tests   Biceps/Labral tests Other2   other   Comment Jerelene Redden(-) Golfers 1 (-)                           PT Education - 01/25/16 0756    Education provided Yes   Education Details Patient advised to try to modify her work at this time. She will do some elbow flexion and extension bt she has been doing it every couple of minutes. she was advised to just do it a few times a day.    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    Time 8   Period Weeks   Status New               Plan - 01/25/16 [id number]    Clinical Impression Statement Patient is a 50 year old female with elbow pain around the insertion of the medial bicpes. She had a negitive mills test and golfers test. She  also has pain around the posterior elbow. She has pain with elbow flexion and when holding boxes at work. Her symtoms and objective measures are consitant with  distal bicpes tendinitis. She has some stiffness in her neck and upper traps but negative cervical compression test.  She was adivsed to rest as much as able and use ice on the area. she would benefit from skilled therapy for anti-inflammatory modalities and strengthening when able.    Rehab Potential Good   Clinical Impairments Affecting Rehab Potential large amount of lifting at her job    PT Frequency 2x / week   PT Duration 8 weeks   PT Treatment/Interventions ADLs/Self Care Home Management;Electrical Stimulation;Iontophoresis 4mg /ml Dexamethasone;Cryotherapy;Moist Heat;Therapeutic exercise;Therapeutic activities;Functional mobility training;Stair training;Gait training;Patient/family education;Manual techniques;Taping;Dry needling;Passive range of motion;Neuromuscular re-education   PT Next Visit Plan If able begin light elbow and wrist stregtening. Continue with ionto, continue with ultraspound; continue with manual therapy to reduce inflammation. Consider manual therapy to the c-spine if needed.    PT Home Exercise Plan Patient advised in RICE at this timeto reduce inflammation    Consulted and Agree with Plan of Care Patient      Patient will benefit from skilled therapeutic intervention in order to improve the following deficits and impairments:  Abnormal gait, Decreased activity tolerance, Decreased mobility, Decreased strength, Increased muscle spasms, Pain  Visit Diagnosis: Pain in left elbow  Muscle weakness (generalized)  Cervicalgia     Problem List Patient Active Problem List   Diagnosis Date Noted  . Pes planus, flexible 10/20/2013    Carney Living PT DPT  01/25/2016, 8:34 AM  [facility] [address] [place], [place], [address] Phone: [phone]   Fax:  [phone]  Name: ARY HELMAN MRN: [id number] Date of Birth: 10/16/1965

## 2016-01-28 ENCOUNTER — Encounter: Payer: Self-pay | Admitting: Sports Medicine

## 2016-01-28 ENCOUNTER — Ambulatory Visit (INDEPENDENT_AMBULATORY_CARE_PROVIDER_SITE_OTHER): Payer: BLUE CROSS/BLUE SHIELD | Admitting: Sports Medicine

## 2016-01-28 DIAGNOSIS — M2041 Other hammer toe(s) (acquired), right foot: Secondary | ICD-10-CM

## 2016-01-28 DIAGNOSIS — M2042 Other hammer toe(s) (acquired), left foot: Secondary | ICD-10-CM | POA: Diagnosis not present

## 2016-01-28 DIAGNOSIS — M79672 Pain in left foot: Secondary | ICD-10-CM | POA: Diagnosis not present

## 2016-01-28 DIAGNOSIS — Q828 Other specified congenital malformations of skin: Secondary | ICD-10-CM

## 2016-01-28 DIAGNOSIS — M79671 Pain in right foot: Secondary | ICD-10-CM

## 2016-01-28 NOTE — Progress Notes (Signed)
Patient ID: Carol [name], female   DOB: [birth year], 50 y.o.   MRN: [id number] Subjective: Carol [name] is a 50 y.o. female patient who presents to office for evaluation of Right> Left foot pain at 5th toe. Patient complains of progressive pain especially over the last 2 years in the Right>Left foot at the 5th toes at keratosis. Patient has tried trim, creams, pads, shoes with no relief in symptoms. Patient denies any other pedal complaints.   Patient Active Problem List   Diagnosis Date Noted  . Pes planus, flexible 10/20/2013    Current Outpatient Prescriptions on File Prior to Visit  Medication Sig Dispense Refill  . Cyanocobalamin (VITAMIN B-12 PO) Take 1 tablet by mouth daily. Reported on 01/16/2016    . cyclobenzaprine (FLEXERIL) 10 MG tablet 1/2-1 tablet po once to twice daily prn 20 tablet 0  . HYDROcodone-acetaminophen (NORCO) 5-325 MG tablet Take 1 tablet by mouth every 6 (six) hours as needed for moderate pain. 20 tablet 0  . naproxen (NAPROSYN) 500 MG tablet Take 1 tablet (500 mg total) by mouth 2 (two) times daily with a meal. 30 tablet 0  . varenicline (CHANTIX STARTING MONTH PAK) 0.5 MG X 11 & 1 MG X 42 tablet Take one 0.5 mg tablet by mouth once daily for 3 days, then increase to one 0.5 mg tablet twice daily for 4 days, then increase to one 1 mg tablet twice daily. 53 tablet 0   No current facility-administered medications on file prior to visit.    Allergies  Allergen Reactions  . Naftin [Naftifine Hcl] Other (See Comments)    Peeling of skin, discoloration    Objective:  General: Alert and oriented x3 in no acute distress  Dermatology: Small hyperkeratotic lesion overlying 5 PIPJ plantar lateral aspect and nail fold bilateral. No open lesions bilateral lower extremities, no webspace macerations, no ecchymosis bilateral, all nails x 10 are well manicured.  Vascular: Dorsalis Pedis and Posterior Tibial pedal pulses 2/4, Capillary Fill Time 3 seconds,(+) pedal hair growth  bilateral, no edema bilateral lower extremities, Temperature gradient within normal limits.  Neurology: Gross sensation intact via light touch bilateral. (- )Tinels sign.   Musculoskeletal: Semi-flexible varus rotated hammertoes with Mild tenderness with palpation at PIPJ Right>Left 5th toe. Ankle, Subtalar, Midtarsal, and MTPJ joint range of motion is within normal limits, there is no 1st ray hypermobility noted bilateral, No bunion deformity noted bilateral. No pain with calf compression bilateral.  Strength within normal limits in all groups bilateral.   Assessment and Plan: Problem List Items Addressed This Visit    None    Visit Diagnoses    Hammertoe, right    -  Primary    Hammertoe, left        Porokeratosis        Foot pain, bilateral           -Complete examination performed -Discussed treatement options for painful keratosis secondary to hammertoe -Patient states that she does not want to have surgery at this time -Trimmed hyperkeratotic lesion using sterile chisel blade at 5th toes without incident -Advised daily skin emollients, pumice stone, cushions, pads, and good supportive shoes  -Encouraged smoking cessation; Patient on Chantix -Patient to return to office as needed or sooner if condition worsens.  Landis Martins, DPM

## 2016-01-28 NOTE — Patient Instructions (Signed)

## 2016-02-06 ENCOUNTER — Ambulatory Visit: Payer: BLUE CROSS/BLUE SHIELD | Admitting: Physical Therapy

## 2016-02-06 DIAGNOSIS — M6281 Muscle weakness (generalized): Secondary | ICD-10-CM

## 2016-02-06 DIAGNOSIS — M25522 Pain in left elbow: Secondary | ICD-10-CM | POA: Diagnosis not present

## 2016-02-06 DIAGNOSIS — M542 Cervicalgia: Secondary | ICD-10-CM

## 2016-02-06 NOTE — Therapy (Signed)
[place], [place], [address] Phone: [phone]   Fax:  [phone]  Physical Therapy Treatment  Patient Details  Name: Carol [name] MRN: [id number] Date of Birth: 02-11-1966 Referring Provider: Chana Bode PA   Encounter Date: 02/06/2016      PT End of Session - 02/06/16 0944    Visit Number 2   Number of Visits 16   Date for PT Re-Evaluation 03/21/16   PT Start Time [id number]   PT Stop Time 0947   PT Time Calculation (min) 60 min   Activity Tolerance Patient tolerated treatment well   Behavior During Therapy [facility] for tasks assessed/performed      Past Medical History  Diagnosis Date  . History of migraine headaches   . Impaired fasting glucose   . Bronchitis   . Hypertension   . Back pain   . History of blood in urine     microscopic    Past Surgical History  Procedure Laterality Date  . Cholecystectomy    . Abdominal hysterectomy  2002  . Tubal ligation      There were no vitals filed for this visit.      Subjective Assessment - 02/06/16 0851    Subjective Went to the [place].  Took pain meds with her.  Ionto patch helped better than any thing.    Currently in Pain? Yes   Pain Score 6   up to 9/10   Pain Location Arm  elbow anterior posterior   Pain Orientation Left   Pain Descriptors / Indicators Aching   Pain Frequency Constant   Aggravating Factors  carring things,   Pain Relieving Factors Medication, ice,  Ionto patch. heat                         [facility] Adult PT Treatment/Exercise - 02/06/16 0001    Neck Exercises: Seated   Other Seated Exercise AROM 5 X each,  cues posture prior to moving    Moist Heat Therapy   Number Minutes Moist Heat 15 Minutes   Moist Heat Location Cervical   Cryotherapy   Number Minutes Cryotherapy 15 Minutes   Cryotherapy Location --  elbow   Type of Cryotherapy --  cold pack   Ultrasound   Ultrasound Location anterior  elbow   Ultrasound Parameters 50 % , 8 minutes   Ultrasound Goals Pain   Iontophoresis   Type of Iontophoresis Dexamethasone   Location --  elbow, anterior/ distal biceps.  #2   Dose 4 mg/ml, 1cc   Time 6 minutes, 6 hour patch    Manual Therapy   Manual Therapy Soft tissue mobilization   Manual therapy comments Neck, upper traps,  very tight. Myofascial work, tissue loosened.  Passive chest stretch pulling shoulders back to assist posture                PT Education - 02/06/16 0944    Education provided Yes   Education Details posture ed, lumbar support,  How to stretch neck   Person(s) Educated Patient   Methods Explanation;Demonstration;Verbal cues   Comprehension Verbalized understanding          PT Short Term Goals - 02/06/16 0947    PT SHORT TERM GOAL #1   Title Patient will report 2/10 pain at worst in the left elbow    Baseline 6/10   Time 4   Period Weeks   Status On-going  PT SHORT TERM GOAL #2   Title Patient will demsotrate pain free elbow flexion and extension on the left    Baseline painful   Time 4   Period Weeks   Status On-going   PT SHORT TERM GOAL #3   Title Patient will increase left elbow strength to 5/5    Time 4   Period Weeks   Status Unable to assess   PT SHORT TERM GOAL #4   Title Patient will be I w/ HEP    Time 4   Period Weeks   Status Unable to assess   PT SHORT TERM GOAL #5   Title Patient will increase left cervical trotation by 10 degrees    Baseline Rotation improves with good posture, not measured   Time 4   Period Weeks   Status On-going           PT Long Term Goals - 01/25/16 [id number]    PT LONG TERM GOAL #1   Title 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    Time 8   Period Weeks   Status New               Plan - 02/06/16 0945    Clinical Impression Statement Ionto helpful.  She has been able to rest since she was on vacation.  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  Focus on pain control,  decreasing inflamation.     PT Next Visit Plan madalities, ionto, neck exercise, posture handout,  beginning ex if ready,  ,see PT plan   PT Home Exercise Plan Patient advised in RICE at this timeto reduce inflammation to continue   Consulted and Agree with Plan of Care Patient      Patient will benefit from skilled therapeutic intervention in order to improve the following deficits and impairments:  Abnormal gait, Decreased activity tolerance, Decreased mobility, Decreased strength, Increased muscle spasms, Pain  Visit Diagnosis: Pain in left elbow  Muscle weakness (generalized)  Cervicalgia     Problem List Patient Active Problem List   Diagnosis Date Noted  . Pes planus, flexible 10/20/2013    [facility] 02/06/2016, 9:50 AM  [facility] [address] [place], [place], [address] Phone: [phone]   Fax:  [phone]  Name: ALAZE [name] MRN: [id number] Date of Birth: Oct 16, 1965    Melvenia Needles, PTA 02/06/2016 9:50 AM Phone: [phone] Fax: [phone]

## 2016-02-08 ENCOUNTER — Ambulatory Visit: Payer: BLUE CROSS/BLUE SHIELD | Admitting: Family Medicine

## 2016-02-08 ENCOUNTER — Telehealth: Payer: Self-pay

## 2016-02-08 NOTE — Telephone Encounter (Signed)
LMTCB- message recvd from answering service to cancel pt 9:30 appt with Dr. Redmond School- call to offer r/s. RLB

## 2016-02-12 ENCOUNTER — Ambulatory Visit: Payer: BLUE CROSS/BLUE SHIELD | Admitting: Physical Therapy

## 2016-02-12 DIAGNOSIS — M25522 Pain in left elbow: Secondary | ICD-10-CM

## 2016-02-12 DIAGNOSIS — M6281 Muscle weakness (generalized): Secondary | ICD-10-CM

## 2016-02-12 DIAGNOSIS — M542 Cervicalgia: Secondary | ICD-10-CM

## 2016-02-13 NOTE — Therapy (Signed)
[place], [place], [address] Phone: [phone]   Fax:  [phone]  Physical Therapy Treatment  Patient Details  Name: Carol [name] MRN: [id number] Date of Birth: [birth year] Referring Provider: Chana Bode PA   Encounter Date: 02/12/2016      PT End of Session - 02/13/16 0854    Visit Number 3   Number of Visits 16   Date for PT Re-Evaluation 03/21/16   PT Start Time 1632   PT Stop Time [id number]   PT Time Calculation (min) 43 min   Activity Tolerance Patient tolerated treatment well   Behavior During Therapy [facility] for tasks assessed/performed      Past Medical History  Diagnosis Date  . History of migraine headaches   . Impaired fasting glucose   . Bronchitis   . Hypertension   . Back pain   . History of blood in urine     microscopic    Past Surgical History  Procedure Laterality Date  . Cholecystectomy    . Abdominal hysterectomy  2002  . Tubal ligation      There were no vitals filed for this visit.      Subjective Assessment - 02/13/16 0842    Subjective Patient feels her elbow is improving but she still had an incedent of increased pain yesterday.    Pertinent History Works Automotive engineer.    Limitations Lifting   How long can you sit comfortably? N/A   How long can you stand comfortably? N/A    How long can you walk comfortably? N/A    Diagnostic tests None taken at this time   Patient Stated Goals No pain    Currently in Pain? Yes   Pain Score 2    Pain Location Arm   Pain Orientation Left   Pain Descriptors / Indicators Aching   Pain Onset More than a month ago   Pain Frequency Constant   Aggravating Factors  carrying objects    Pain Relieving Factors Medication/ Ionto   Effect of Pain on Daily Activities Difficulty carrying boxes at work.    Multiple Pain Sites No                         [facility] Adult PT Treatment/Exercise - 02/13/16 0001    Exercises    Exercises Knee/Hip   Shoulder Exercises: Supine   External Rotation Limitations wand 2x10 bilateral    Flexion Limitations wand 2x10    Shoulder Exercises: Standing   Extension Limitations red 2x10    Row Limitations red 2x10    Ultrasound   Ultrasound Location anteriro elbow    Ultrasound Parameters 50%    Ultrasound Goals Pain   Iontophoresis   Type of Iontophoresis Dexamethasone   Location --  elbow, anterior/ distal biceps.  #2   Dose 4 mg/ml, 1cc   Time 6 minutes, 6 hour patch    Manual Therapy   Manual Therapy Soft tissue mobilization   Manual therapy comments Neck, upper traps,  very tight. Myofascial work, tissue loosened.  Passive chest stretch pulling shoulders back to assist posture; IASTYM  to medial distal biceps area. Passive shoulder motion                 PT Education - 02/13/16 0844    Education provided No   Education Details psoture education    Person(s) Educated Patient   Methods Explanation;Demonstration;Verbal cues   Comprehension Verbalized  understanding          PT Short Term Goals - 02/13/16 0859    PT SHORT TERM GOAL #1   Title Patient will report 2/10 pain at worst in the left elbow    Baseline 3/10   Time 4   Period Weeks   Status On-going   PT SHORT TERM GOAL #2   Title Patient will demsotrate pain free elbow flexion and extension on the left    Baseline painful   Time 4   Period Weeks   Status On-going   PT SHORT TERM GOAL #3   Title Patient will increase left elbow strength to 5/5    Time 4   PT SHORT TERM GOAL #4   Title Patient will be I w/ HEP    Time 4   Period Weeks   Status Unable to assess   PT SHORT TERM GOAL #5   Title Patient will increase left cervical trotation by 10 degrees    Baseline Rotation improves with good posture, not measured   Time 4   Period Weeks   Status On-going           PT Long Term Goals - 01/25/16 [id number]    PT LONG TERM GOAL #1   Title 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    Time 8   Period Weeks   Status New               Plan - 02/13/16 0854    Clinical Impression Statement Patient had focal swelling around her biceps    Rehab Potential Good   Clinical Impairments Affecting Rehab Potential large amount of lifting at her job    PT Frequency 2x / week   PT Duration 8 weeks   PT Treatment/Interventions ADLs/Self Care Home Management;Electrical Stimulation;Iontophoresis 4mg /ml Dexamethasone;Cryotherapy;Moist Heat;Therapeutic exercise;Therapeutic activities;Functional mobility training;Stair training;Gait training;Patient/family education;Manual techniques;Taping;Dry needling;Passive range of motion;Neuromuscular re-education   PT Next Visit Plan madalities, ionto, neck exercise, posture handout,  beginning ex if ready,  ,see PT plan   PT Home Exercise Plan Patient advised in RICE at this timeto reduce inflammation to continue   Consulted and Agree with Plan of Care Patient      Patient will benefit from skilled therapeutic intervention in order to improve the following deficits and impairments:  Abnormal gait, Decreased activity tolerance, Decreased mobility, Decreased strength, Increased muscle spasms, Pain  Visit Diagnosis: Pain in left elbow  Muscle weakness (generalized)  Cervicalgia     Problem List Patient Active Problem List   Diagnosis Date Noted  . Pes planus, flexible 10/20/2013    Carney Living PT DPT  02/13/2016, 9:05 AM  [facility] [address] [place], [place], [address] Phone: [phone]   Fax:  [phone]  Name: Carol [name] MRN: [id number] Date  of Birth: 09-28-1965

## 2016-02-14 ENCOUNTER — Ambulatory Visit: Payer: BLUE CROSS/BLUE SHIELD | Admitting: Physical Therapy

## 2016-02-18 ENCOUNTER — Ambulatory Visit: Payer: BLUE CROSS/BLUE SHIELD | Attending: Medical | Admitting: Physical Therapy

## 2016-02-18 DIAGNOSIS — M25522 Pain in left elbow: Secondary | ICD-10-CM | POA: Diagnosis not present

## 2016-02-18 DIAGNOSIS — M542 Cervicalgia: Secondary | ICD-10-CM | POA: Insufficient documentation

## 2016-02-18 DIAGNOSIS — M6281 Muscle weakness (generalized): Secondary | ICD-10-CM | POA: Diagnosis present

## 2016-02-18 NOTE — Therapy (Signed)
[place], [place], [address] Phone: [phone]   Fax:  [phone]  Physical Therapy Treatment  Patient Details  Name: Carol [name] MRN: [id number] Date of Birth: 1965-12-02 Referring Provider: Chana Bode PA   Encounter Date: 02/18/2016      PT End of Session - 02/18/16 1726    Visit Number 4   Number of Visits 16   Date for PT Re-Evaluation 03/21/16   PT Start Time [id number]   PT Stop Time 1625   PT Time Calculation (min) 40 min   Activity Tolerance Patient tolerated treatment well   Behavior During Therapy [facility] for tasks assessed/performed      Past Medical History  Diagnosis Date  . History of migraine headaches   . Impaired fasting glucose   . Bronchitis   . Hypertension   . Back pain   . History of blood in urine     microscopic    Past Surgical History  Procedure Laterality Date  . Cholecystectomy    . Abdominal hysterectomy  2002  . Tubal ligation      There were no vitals filed for this visit.      Subjective Assessment - 02/18/16 1725    Subjective Patient feels her elbow is improving but she still had an incedent of increased pain yesterday. Her pain is now reaching an 8/10 and it is radiating up towards her shoulder.    Pertinent History Works Automotive engineer.    Limitations Lifting   How long can you sit comfortably? N/A   How long can you stand comfortably? N/A    How long can you walk comfortably? N/A    Diagnostic tests None taken at this time   Patient Stated Goals No pain    Currently in Pain? Yes   Pain Score 8    Pain Location Arm   Pain Orientation Left   Pain Descriptors / Indicators Aching   Pain Radiating Towards left shoulder    Pain Onset More than a month ago   Pain Frequency Constant   Aggravating Factors  crrying objects   Pain Relieving Factors medication/ ionto    Effect of Pain on Daily Activities difficulty carrying boxes                           [facility] Adult PT Treatment/Exercise - 02/18/16 0001    Ultrasound   Ultrasound Location anterior    Ultrasound Parameters 50%   Ultrasound Goals Pain   Iontophoresis   Type of Iontophoresis Dexamethasone   Location --  elbow, anterior/ distal biceps.  #2   Dose 4 mg/ml, 1cc   Time 6 minutes, 6 hour patch    Manual Therapy   Manual Therapy Soft tissue mobilization   Manual therapy comments Neck, upper traps,  very tight. Myofascial work, tissue loosened.  Passive chest stretch pulling shoulders back to assist posture; IASTYM  to medial distal biceps area. Passive shoulder motion                 PT Education - 02/18/16 1726    Education provided Yes   Education Details education in lifting modification at work    [employer]) Educated Patient   Methods Explanation;Demonstration;Verbal cues   Comprehension Verbalized understanding;Returned demonstration          PT Short Term Goals - 02/13/16 0859    PT SHORT TERM GOAL #1   Title Patient  will report 2/10 pain at worst in the left elbow    Baseline 3/10   Time 4   Period Weeks   Status On-going   PT SHORT TERM GOAL #2   Title Patient will demsotrate pain free elbow flexion and extension on the left    Baseline painful   Time 4   Period Weeks   Status On-going   PT SHORT TERM GOAL #3   Title Patient will increase left elbow strength to 5/5    Time 4   PT SHORT TERM GOAL #4   Title Patient will be I w/ HEP    Time 4   Period Weeks   Status Unable to assess   PT SHORT TERM GOAL #5   Title Patient will increase left cervical trotation by 10 degrees    Baseline Rotation improves with good posture, not measured   Time 4   Period Weeks   Status On-going           PT Long Term Goals - 01/25/16 [id number]    PT LONG TERM GOAL #1   Title 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    Time 8   Period Weeks   Status New               Plan - 02/18/16 1730    Clinical Impression Statement Patient continues to have focal swelling with manual work. She reported increased pain with exercises so they were held today., Therapy will re-start exercises next visit. She also had elbow pain with passive ROM of the shoulder. Median nerve  upper limb tension test positive.    Rehab Potential Good   Clinical Impairments Affecting Rehab Potential large amount of lifting at her job    PT Next Visit Plan add wrist nd elbow exercises. Continue with shoulder exercises if able. Consider dry needling.    PT Home Exercise Plan Patient advised in RICE at this timeto reduce inflammation to continue   Consulted and Agree with Plan of Care Patient      Patient will benefit from skilled therapeutic intervention in order to improve the following deficits and impairments:  Abnormal gait, Decreased activity tolerance, Decreased mobility, Decreased strength, Increased muscle spasms, Pain  Visit Diagnosis: Pain in left elbow  Muscle weakness (generalized)  Cervicalgia     Problem List Patient Active Problem List   Diagnosis Date Noted  . Pes planus, flexible 10/20/2013    Carney Living PT DPT  02/18/2016, 5:33 PM  [facility] [address] [place], [place], [address] Phone: [phone]   Fax:  [phone]  Name: Carol [name] MRN: [id number] Date of Birth: 19-Jul-1966

## 2016-02-21 ENCOUNTER — Ambulatory Visit: Payer: BLUE CROSS/BLUE SHIELD | Admitting: Physical Therapy

## 2016-02-25 ENCOUNTER — Ambulatory Visit: Payer: BLUE CROSS/BLUE SHIELD | Admitting: Physical Therapy

## 2016-02-25 DIAGNOSIS — M25522 Pain in left elbow: Secondary | ICD-10-CM

## 2016-02-25 DIAGNOSIS — M6281 Muscle weakness (generalized): Secondary | ICD-10-CM

## 2016-02-25 DIAGNOSIS — M542 Cervicalgia: Secondary | ICD-10-CM

## 2016-02-25 NOTE — Therapy (Signed)
[place], [place], [address] Phone: [phone]   Fax:  [phone]  Physical Therapy Treatment  Patient Details  Name: Carol [name] MRN: [id number] Date of Birth: June 23, 1966 Referring Provider: Chana Bode PA   Encounter Date: 02/25/2016      PT End of Session - 02/25/16 1748    Visit Number 5   Number of Visits 16   Date for PT Re-Evaluation 03/21/16   PT Start Time 0346   PT Stop Time 0436   PT Time Calculation (min) 50 min   Activity Tolerance Patient tolerated treatment well   Behavior During Therapy [facility] for tasks assessed/performed      Past Medical History  Diagnosis Date  . History of migraine headaches   . Impaired fasting glucose   . Bronchitis   . Hypertension   . Back pain   . History of blood in urine     microscopic    Past Surgical History  Procedure Laterality Date  . Cholecystectomy    . Abdominal hysterectomy  2002  . Tubal ligation      There were no vitals filed for this visit.      Subjective Assessment - 02/25/16 1551    Subjective "over the weekend I've noticed it is more likely muscle related, along the inside of my elbow"   Currently in Pain? Yes   Pain Score 5    Pain Orientation Left   Pain Descriptors / Indicators Aching   Pain Type Chronic pain   Aggravating Factors  carrying objects   Pain Relieving Factors medication / ionto                          [facility] Adult PT Treatment/Exercise - 02/25/16 0001    Cryotherapy   Number Minutes Cryotherapy 10 Minutes   Cryotherapy Location Forearm  L elbow   Type of Cryotherapy Ice pack   Manual Therapy   Manual Therapy Soft tissue mobilization;Myofascial release;Other (comment)   Soft tissue mobilization IASTM over common wrist flexor/ pronator teres and distal biceps brachii on L   Myofascial Release Fascial and stretching and rolling of L common flexors/ prontator teres   Other Manual  Therapy contract/ relax stretching of L wrist common flexors, pronator teres 3 x 30 sec hold with 10 sec contraction          Trigger Point Dry Needling - 02/25/16 1743    Consent Given? Yes   Education Handout Provided Yes   Muscles Treated Upper Body --  L pronator teres and common flexor, Biceps Brachii              PT Education - 02/25/16 1747    Education provided Yes   Education Details dry needling eduation benefts and effects as well as aftercare   Person(s) Educated Patient   Methods Explanation;Verbal cues   Comprehension Verbalized understanding          PT Short Term Goals - 02/13/16 0859    PT SHORT TERM GOAL #1   Title Patient will report 2/10 pain at worst in the left elbow    Baseline 3/10   Time 4   Period Weeks   Status On-going   PT SHORT TERM GOAL #2   Title Patient will demsotrate pain free elbow flexion and extension on the left    Baseline painful   Time 4   Period Weeks   Status On-going  PT SHORT TERM GOAL #3   Title Patient will increase left elbow strength to 5/5    Time 4   PT SHORT TERM GOAL #4   Title Patient will be I w/ HEP    Time 4   Period Weeks   Status Unable to assess   PT SHORT TERM GOAL #5   Title Patient will increase left cervical trotation by 10 degrees    Baseline Rotation improves with good posture, not measured   Time 4   Period Weeks   Status On-going           PT Long Term Goals - 01/25/16 [id number]    PT LONG TERM GOAL #1   Title 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    Time 8   Period Weeks   Status New               Plan - 02/25/16 1749    Clinical Impression Statement Mrs. Kennon Rounds provided conset for DN of the L Wrist common flexors/ pronator teres, and distal biceps brachii; pt was monitored throughout treatment. IASTM and Myosfascial techniques were performed post DN whcih pt reported some relief. pt did report she felt like she may cramp, which was relieved with contract/ relax stretching. performed Adson testing for thoracic outlet but testing was negative.    PT Next Visit Plan assess response to DN, add wrist nd elbow exercises. Continue with shoulder exercises if able.   Consulted and Agree with Plan of Care Patient      Patient will benefit from skilled therapeutic intervention in order to improve the following deficits and impairments:  Abnormal gait, Decreased activity tolerance, Decreased mobility, Decreased strength, Increased muscle spasms, Pain  Visit Diagnosis: Pain in left elbow  Muscle weakness (generalized)  Cervicalgia     Problem List Patient Active Problem List   Diagnosis Date Noted  . Pes planus, flexible 10/20/2013   Starr Lake PT, DPT, LAT, ATC  02/25/2016  6:00 PM    [facility] [address] [place], [place], [address] Phone: [phone]   Fax:  [phone]  Name: Carol [name] MRN: [id number] Date of Birth: [birth year]

## 2016-02-28 ENCOUNTER — Ambulatory Visit: Payer: BLUE CROSS/BLUE SHIELD | Admitting: Physical Therapy

## 2016-02-28 DIAGNOSIS — M25522 Pain in left elbow: Secondary | ICD-10-CM

## 2016-02-28 DIAGNOSIS — M542 Cervicalgia: Secondary | ICD-10-CM

## 2016-02-28 DIAGNOSIS — M6281 Muscle weakness (generalized): Secondary | ICD-10-CM

## 2016-02-29 NOTE — Therapy (Signed)
[place], [place], [address] Phone: [phone]   Fax:  [phone]  Physical Therapy Treatment  Patient Details  Name: Carol [name] MRN: [id number] Date of Birth: [birth year] Referring Provider: Chana Bode PA   Encounter Date: 02/28/2016    Past Medical History  Diagnosis Date  . History of migraine headaches   . Impaired fasting glucose   . Bronchitis   . Hypertension   . Back pain   . History of blood in urine     microscopic    Past Surgical History  Procedure Laterality Date  . Cholecystectomy    . Abdominal hysterectomy  2002  . Tubal ligation      There were no vitals filed for this visit.      Subjective Assessment - 02/29/16 0939    Subjective Patient reports improvement after needling. she continues to have some soreness in her elbow but it is better. She also feels improvement with her shoulder motion. she is having some neck pain today, She was 8 min late for her appointment. She had to lift a lot of boxes over the past few days.    Pertinent History [employer].    Limitations Lifting   How long can you sit comfortably? N/A   How long can you stand comfortably? N/A    How long can you walk comfortably? N/A    Diagnostic tests None taken at this time   Patient Stated Goals No pain    Currently in Pain? Yes   Pain Score 3    Pain Location Arm   Pain Orientation Left   Pain Descriptors / Indicators Aching   Pain Type Chronic pain   Pain Onset More than a month ago   Pain Frequency Constant   Aggravating Factors  carrying objects.    Pain Relieving Factors medication rest   Effect of Pain on Daily Activities difficulty carrying boxes    Multiple Pain Sites No                         [facility] Adult PT Treatment/Exercise - 02/29/16 0001    Moist Heat Therapy   Number Minutes Moist Heat 15 Minutes   Moist Heat Location Cervical   Manual Therapy   Manual Therapy Soft tissue mobilization;Myofascial release;Other (comment)   Soft tissue mobilization IASTM over common wrist flexor/ pronator teres and distal biceps brachii on L   Other Manual Therapy sub occipital release; upper trap trigger point release;                   PT Short Term Goals - 02/13/16 0859    PT SHORT TERM GOAL #1   Title Patient will report 2/10 pain at worst in the left elbow    Baseline 3/10   Time 4   Period Weeks   Status On-going   PT SHORT TERM GOAL #2   Title Patient will demsotrate pain free elbow flexion and extension on the left    Baseline painful   Time 4   Period Weeks   Status On-going   PT SHORT TERM GOAL #3   Title Patient will increase left elbow strength to 5/5    Time 4   PT SHORT TERM GOAL #4   Title Patient will be I w/ HEP    Time 4   Period Weeks   Status Unable to assess   PT SHORT TERM GOAL #  5   Title Patient will increase left cervical trotation by 10 degrees    Baseline Rotation improves with good posture, not measured   Time 4   Period Weeks   Status On-going           PT Long Term Goals - 01/25/16 [id number]    PT LONG TERM GOAL #1   Title 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    Time 8   Period Weeks   Status New               Plan - 02/29/16 1143    Clinical Impression Statement Therapy focused on manual therapy to her neck and to her elbow today. She was also given wrist and elbow stregthening. No significant pain after treatment. She had  imporoved shoulder ROM. She will do 2 more sessions of dry neediling.    Rehab Potential Good   Clinical Impairments Affecting Rehab Potential large amount of lifting at her job    PT Frequency 2x / week   PT Duration 8 weeks   PT Treatment/Interventions ADLs/Self Care Home Management;Electrical Stimulation;Iontophoresis 4mg /ml Dexamethasone;Cryotherapy;Moist Heat;Therapeutic exercise;Therapeutic activities;Functional mobility training;Stair training;Gait training;Patient/family education;Manual techniques;Taping;Dry needling;Passive range of motion;Neuromuscular re-education   PT Next Visit Plan assess response to DN, add wrist nd elbow exercises. Continue with shoulder exercises if able.   PT Home Exercise Plan Patient advised in RICE at this timeto reduce inflammation to continue   Consulted and Agree with Plan of Care Patient      Patient will benefit from skilled therapeutic intervention in order to improve the following deficits and impairments:  Abnormal gait, Decreased activity tolerance, Decreased mobility, Decreased strength, Increased muscle spasms, Pain  Visit Diagnosis: Pain in left elbow  Muscle weakness (generalized)  Cervicalgia     Problem List Patient Active Problem List   Diagnosis Date Noted  . Pes planus, flexible 10/20/2013    Carney Living PT DPT  02/29/2016, 11:47 AM  [facility] [address] [place], [place], [address] Phone: [phone]   Fax:  [phone]  Name: Carol [name] MRN: [id number] Date of Birth: [birth year]

## 2016-03-07 ENCOUNTER — Ambulatory Visit: Payer: BLUE CROSS/BLUE SHIELD | Admitting: Physical Therapy

## 2016-03-10 ENCOUNTER — Ambulatory Visit: Payer: BLUE CROSS/BLUE SHIELD | Admitting: Physical Therapy

## 2016-03-10 DIAGNOSIS — M6281 Muscle weakness (generalized): Secondary | ICD-10-CM

## 2016-03-10 DIAGNOSIS — M25522 Pain in left elbow: Secondary | ICD-10-CM | POA: Diagnosis not present

## 2016-03-10 DIAGNOSIS — M542 Cervicalgia: Secondary | ICD-10-CM

## 2016-03-10 NOTE — Therapy (Signed)
Carol [name], [place], [address] Phone: [phone]   Fax:  [phone]  Physical Therapy Treatment  Patient Details  Name: Carol [name] MRN: [id number] Date of Birth: Dec 27, 1965 Referring Provider: Chana Bode PA   Encounter Date: 03/10/2016      PT End of Session - 03/10/16 1553    Visit Number 6   Number of Visits 16   Date for PT Re-Evaluation 03/21/16   PT Start Time [id number]   PT Stop Time 1505   PT Time Calculation (min) 49 min   Activity Tolerance Patient tolerated treatment well   Behavior During Therapy [facility] for tasks assessed/performed      Past Medical History:  Diagnosis Date  . Back pain   . Bronchitis   . History of blood in urine    microscopic  . History of migraine headaches   . Hypertension   . Impaired fasting glucose     Past Surgical History:  Procedure Laterality Date  . ABDOMINAL HYSTERECTOMY  2002  . CHOLECYSTECTOMY    . TUBAL LIGATION      There were no vitals filed for this visit.      Subjective Assessment - 03/10/16 1424    Subjective "I am doing better today, some soreness after prolonged standing/ lifting"    Currently in Pain? Yes   Aggravating Factors  prolong lifting and carrying   Multiple Pain Sites Yes   Pain Score 1   Pain Location Back   Pain Orientation Upper;Mid   Pain Descriptors / Indicators Tightness   Pain Type Chronic pain   Pain Onset More than a month ago   Pain Frequency Intermittent   Aggravating Factors  bending, moving   Pain Relieving Factors relaxing, heat pad                         [facility] Adult PT Treatment/Exercise - 03/10/16 0001      Manual Therapy   Manual Therapy Joint mobilization   Joint Mobilization grade 3 C1-C7 central P>A    Soft tissue mobilization IASTM over common wrist flexor/ pronator teres, multifudus at L3, T2 and C3          Trigger Point Dry Needling - 03/10/16 1427    Consent  Given? Yes   Education Handout Provided Yes   Muscles Treated Upper Body Longissimus  common flexor x 2 in L elbow   Longissimus Response Palpable increased muscle length;Twitch response elicited  L3 bil, T2 bil, and C3 bil multifidus              PT Education - 03/10/16 1553    Education provided Yes   Education Details dry needling over lung field education   Person(s) Educated Patient   Methods Explanation   Comprehension Verbalized understanding          PT Short Term Goals - 02/13/16 0859      PT SHORT TERM GOAL #1   Title Patient will report 2/10 pain at worst in the left elbow    Baseline 3/10   Time 4   Period Weeks   Status On-going     PT SHORT TERM GOAL #2   Title Patient will demsotrate pain free elbow flexion and extension on the left    Baseline painful   Time 4   Period Weeks   Status On-going     PT SHORT TERM GOAL #3   Title Patient  will increase left elbow strength to 5/5    Time 4     PT SHORT TERM GOAL #4   Title Patient will be I w/ HEP    Time 4   Period Weeks   Status Unable to assess     PT SHORT TERM GOAL #5   Title Patient will increase left cervical trotation by 10 degrees    Baseline Rotation improves with good posture, not measured   Time 4   Period Weeks   Status On-going           PT Long Term Goals - 01/25/16 [id number]      PT LONG TERM GOAL #1   Title 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    Time 8   Period Weeks   Status New                Plan - 03/10/16 1737    Clinical Impression Statement Ms. Kozloski reports she is doing better in the elbow but reports tightness in the neck and back. DN was performed; pt monitored throughout treatment. Following Soft tissue work she reported pain and tightness dropped in the back. utilized MHp post session to calm down sorenss from DN.   PT Next Visit Plan assess response to DN, add wrist nd elbow exercises. Continue with shoulder exercises if able.   Consulted and Agree with Plan of Care Patient      Patient will benefit from skilled therapeutic intervention in order to improve the following deficits and impairments:     Visit Diagnosis: Pain in left elbow  Muscle weakness (generalized)  Cervicalgia     Problem List Patient Active Problem List   Diagnosis Date Noted  . Pes planus, flexible 10/20/2013   Starr Lake PT, DPT, LAT, ATC  03/10/16  5:39 PM      [facility] [address] [place], [place], [address] Phone: [phone]   Fax:  [phone]  Name: Carol [name] MRN: [id number] Date of Birth: 03/11/1966

## 2016-03-18 ENCOUNTER — Ambulatory Visit: Payer: BLUE CROSS/BLUE SHIELD | Attending: Medical | Admitting: Physical Therapy

## 2016-03-18 DIAGNOSIS — M25522 Pain in left elbow: Secondary | ICD-10-CM | POA: Diagnosis not present

## 2016-03-18 DIAGNOSIS — M6281 Muscle weakness (generalized): Secondary | ICD-10-CM

## 2016-03-18 DIAGNOSIS — M542 Cervicalgia: Secondary | ICD-10-CM

## 2016-03-18 NOTE — Therapy (Signed)
[place], [place], [address] Phone: [phone]   Fax:  [phone]  Physical Therapy Treatment / Re-Certification  Patient Details  Name: Carol [name] MRN: [id number] Date of Birth: [birth year] Referring Provider: Chana Bode PA   Encounter Date: 03/18/2016      PT End of Session - 03/18/16 1511    Visit Number 7   Number of Visits 16   Date for PT Re-Evaluation 04/22/16   PT Start Time 1500   PT Stop Time 1546   PT Time Calculation (min) 46 min   Activity Tolerance Patient tolerated treatment well   Behavior During Therapy [facility] for tasks assessed/performed      Past Medical History:  Diagnosis Date  . Back pain   . Bronchitis   . History of blood in urine    microscopic  . History of migraine headaches   . Hypertension   . Impaired fasting glucose     Past Surgical History:  Procedure Laterality Date  . ABDOMINAL HYSTERECTOMY  2002  . CHOLECYSTECTOMY    . TUBAL LIGATION      There were no vitals filed for this visit.      Subjective Assessment - 03/18/16 1500    Subjective "I was doing something and I got like an electric spasm inthe back and was unsure of what I did" pt reports doing pretty good. had a quick stretch in my finger tips.   Currently in Pain? No/denies   Pain Location Arm   Pain Orientation Left   Pain Frequency Constant   Pain Score 0   Pain Location Back            [facility] PT Assessment - 03/18/16 0001      Observation/Other Assessments   Focus on Therapeutic Outcomes (FOTO)  42% limited     AROM   Cervical Flexion 42   Cervical Extension 30  soreness at end range   Cervical - Right Rotation 40   Cervical - Left Rotation 45     Strength   Left Shoulder Flexion 4+/5   Left Elbow Flexion 5/5   Left Wrist Flexion 5/5   Left Wrist Extension 5/5                             PT Education - 03/18/16 1548    Education provided Yes    Education Details posture education and proper lifting and carrying mechanics, HEP review. progress regarding funciton and FOTO. anatomy of the upper trap and levator scapulae and effects on cervical mobility in regard to tightness.    Person(s) Educated Patient   Methods Explanation;Demonstration;Verbal cues   Comprehension Verbalized understanding;Verbal cues required          PT Short Term Goals - 03/18/16 1521      PT SHORT TERM GOAL #1   Title Patient will report 2/10 pain at worst in the left elbow    Time 4   Period Weeks   Status Achieved     PT SHORT TERM GOAL #2   Title Patient will demsotrate pain free elbow flexion and extension on the left    Time 4   Period Weeks   Status Achieved     PT SHORT TERM GOAL #3   Title Patient will increase left elbow strength to 5/5    Time 4   Period Weeks   Status Achieved  PT SHORT TERM GOAL #4   Title Patient will be I w/ HEP    Time 4   Period Weeks   Status Achieved     PT SHORT TERM GOAL #5   Title Patient will increase left cervical trotation by 10 degrees    Time 4   Period Weeks   Status On-going           PT Long Term Goals - 03/18/16 1523      PT LONG TERM GOAL #1   Title 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    Time 8   Period Weeks   Status On-going               Plan - 03/18/16 1549    Clinical Impression Statement Ms. Leis states she is doing  better with report of no pain just tightness. she has met all short term goals except #5, and partially met LTG #3, and partially met LTG #1. She continues to demonstrates limited cervical mobility especialy with rotation with tightness of bil upper traps and levator scapulae. plan to continue with current POC for 2 x a week for the next 4 weeks to work toward remaining goals and independent exercise.    Rehab Potential Good   PT Frequency 2x / week   PT Duration 4 weeks   PT Treatment/Interventions ADLs/Self Care Home Management;Electrical Stimulation;Iontophoresis '4mg'$ /ml Dexamethasone;Cryotherapy;Moist Heat;Therapeutic exercise;Therapeutic activities;Functional mobility training;Stair training;Gait training;Patient/family education;Manual techniques;Taping;Dry needling;Passive range of motion;Neuromuscular re-education   PT Next Visit Plan DN of upper trap, stretching, posture/ lifting and carrying exercises, manual on neck muscles.    PT Home Exercise Plan posture education, HEP review   Consulted and Agree with Plan of Care Patient      Patient will benefit from skilled therapeutic intervention in order to improve the following deficits and impairments:  Abnormal gait, Decreased activity tolerance, Decreased mobility, Decreased strength, Increased muscle spasms, Pain  Visit Diagnosis: Pain in left elbow - Plan: PT plan of care cert/re-cert  Muscle weakness (generalized) - Plan: PT plan of care cert/re-cert  Cervicalgia - Plan: PT plan of care cert/re-cert     Problem List Patient Active Problem List   Diagnosis Date Noted  . Pes planus, flexible 10/20/2013   Starr Lake PT, DPT, LAT, ATC  03/18/16  3:57 PM      [facility] [address] [place], [place], [address] Phone: [phone]   Fax:  [phone]  Name: Carol [name] MRN: [id number] Date of Birth: [birth year]

## 2016-03-18 NOTE — Patient Instructions (Signed)

## 2016-03-21 ENCOUNTER — Ambulatory Visit: Payer: BLUE CROSS/BLUE SHIELD | Admitting: Physical Therapy

## 2016-03-21 DIAGNOSIS — M25522 Pain in left elbow: Secondary | ICD-10-CM

## 2016-03-21 DIAGNOSIS — M542 Cervicalgia: Secondary | ICD-10-CM

## 2016-03-21 DIAGNOSIS — M6281 Muscle weakness (generalized): Secondary | ICD-10-CM

## 2016-03-21 NOTE — Therapy (Signed)
[facility] [address] [place], [place], [address] Phone: [phone]   Fax:  [phone]  Physical Therapy Treatment  Patient Details  Name: Carol [name] MRN: [id number] Date of Birth: May 04, 1966 Referring Provider: Crosby Oyster PA   Encounter Date: 03/21/2016      PT End of Session - 03/21/16 0812    Visit Number 8   Number of Visits 16   Date for PT Re-Evaluation 04/22/16   PT Start Time 0806   PT Stop Time 0903   PT Time Calculation (min) 57 min      Past Medical History:  Diagnosis Date  . Back pain   . Bronchitis   . History of blood in urine    microscopic  . History of migraine headaches   . Hypertension   . Impaired fasting glucose     Past Surgical History:  Procedure Laterality Date  . ABDOMINAL HYSTERECTOMY  2002  . CHOLECYSTECTOMY    . TUBAL LIGATION      There were no vitals filed for this visit.      Subjective Assessment - 03/21/16 0807    Currently in Pain? Yes   Pain Score 5    Pain Location Neck   Pain Orientation Posterior;Left;Right   Pain Descriptors / Indicators Sore                         [facility] Adult PT Treatment/Exercise - 03/21/16 0001      Neck Exercises: Seated   Other Seated Exercise AROM 5 X each,  cues posture prior to moving      Shoulder Exercises: Standing   Extension Limitations red 2x10    Row Limitations red 2x10      Shoulder Exercises: ROM/Strengthening   UBE (Upper Arm Bike) 2 min forward, 2 min back  L1      Moist Heat Therapy   Number Minutes Moist Heat 15 Minutes   Moist Heat Location Cervical     Manual Therapy   Soft tissue mobilization IASTM to rhomboids upper traps, levator     Neck Exercises: Stretches   Upper Trapezius Stretch 3 reps;30 seconds   Levator Stretch 3 reps;30 seconds   Corner Stretch 3 reps;30 seconds   Other Neck Stretches --                PT Education - 03/21/16 0825    Education provided  Yes   Education Details upper trap and levator stretch   Person(s) Educated Patient   Methods Explanation;Handout   Comprehension Verbalized understanding          PT Short Term Goals - 03/18/16 1521      PT SHORT TERM GOAL #1   Title Patient will report 2/10 pain at worst in the left elbow    Time 4   Period Weeks   Status Achieved     PT SHORT TERM GOAL #2   Title Patient will demsotrate pain free elbow flexion and extension on the left    Time 4   Period Weeks   Status Achieved     PT SHORT TERM GOAL #3   Title Patient will increase left elbow strength to 5/5    Time 4   Period Weeks   Status Achieved     PT SHORT TERM GOAL #4   Title Patient will be I w/ HEP    Time 4   Period Weeks   Status Achieved  PT SHORT TERM GOAL #5   Title Patient will increase left cervical trotation by 10 degrees    Time 4   Period Weeks   Status On-going           PT Long Term Goals - 03/18/16 1523      PT LONG TERM GOAL #1   Title 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    Time 8   Period Weeks   Status On-going               Plan - 03/21/16 0825    Clinical Impression Statement pt reports increased soreness and tightness today in upper traps going down to lower back.  Began UBE and issued levator and upper trap stretches for HEP. Pt feels improved AROM for cervical rotation and side bend stretches. Manual to neck and upper  back for HMP to reduce tension today. Pt reports feeling better after treatment.    PT Next Visit Plan DN of upper trap, stretching, posture/ lifting and carrying exercises, manual on neck muscles. ; REVIEW neck stretches to continue after dry needling.    PT Home Exercise Plan posture education, HEP review, upper trap and levator stretch      Patient will benefit from skilled therapeutic intervention in order to improve the following deficits and impairments:  Abnormal gait, Decreased activity tolerance, Decreased mobility, Decreased strength, Increased muscle spasms, Pain  Visit Diagnosis: Pain in left elbow  Muscle weakness (generalized)  Cervicalgia     Problem List Patient Active Problem List   Diagnosis Date Noted  . Pes planus, flexible 10/20/2013    Dorene Ar, [place] 03/21/2016, 9:13 AM  [facility] [address] [place], [place], [address] Phone: [phone]   Fax:  [phone]  Name: Carol [name] MRN: [id number] Date of Birth: 05-Feb-1966

## 2016-03-21 NOTE — Patient Instructions (Signed)
Levator Stretch   Grasp seat or sit on hand on side to be stretched. Turn head toward other side and look down. Use hand on head to gently stretch neck in that position. Hold ___30_ seconds. Repeat on other side. Repeat _3___ times. Do _2___ sessions per day.  http://gt2.exer.us/30   Copyright  VHI. All rights reserved.  Side-Bending   One hand on opposite side of head, pull head to side as far as is comfortable. Stop if there is pain. Hold _30___ seconds. Repeat with other hand to other side. Repeat __3__ times. Do ___2_ sessions per day.

## 2016-03-25 ENCOUNTER — Ambulatory Visit: Payer: BLUE CROSS/BLUE SHIELD | Admitting: Physical Therapy

## 2016-03-26 ENCOUNTER — Ambulatory Visit: Payer: BLUE CROSS/BLUE SHIELD | Admitting: Physical Therapy

## 2016-03-26 DIAGNOSIS — M542 Cervicalgia: Secondary | ICD-10-CM

## 2016-03-26 DIAGNOSIS — M25522 Pain in left elbow: Secondary | ICD-10-CM

## 2016-03-26 DIAGNOSIS — M6281 Muscle weakness (generalized): Secondary | ICD-10-CM

## 2016-03-26 NOTE — Therapy (Signed)
[facility] [address] [place], [place], [address] Phone: [phone]   Fax:  [phone]  Physical Therapy Treatment  Patient Details  Name: Carol [name] MRN: [id number] Date of Birth: 08/09/1966 Referring Provider: David Tysinger PA   Encounter Date: 03/26/2016      PT End of Session - 03/26/16 0859    Visit Number 8   Number of Visits 16   Date for PT Re-Evaluation 04/22/16   PT Start Time 0847   PT Stop Time 0945   PT Time Calculation (min) 58 min      Past Medical History:  Diagnosis Date  . Back pain   . Bronchitis   . History of blood in urine    microscopic  . History of migraine headaches   . Hypertension   . Impaired fasting glucose     Past Surgical History:  Procedure Laterality Date  . ABDOMINAL HYSTERECTOMY  2002  . CHOLECYSTECTOMY    . TUBAL LIGATION      There were no vitals filed for this visit.      Subjective Assessment - 03/26/16 0850    Currently in Pain? Yes   Pain Score 5   only with looking up   Pain Location Neck   Pain Orientation Lower;Posterior   Pain Descriptors / Indicators --  pinching   Aggravating Factors  looking up   Pain Relieving Factors not looking up   Pain Location Back   Pain Orientation Lower   Aggravating Factors  stiffness in the morning   Pain Relieving Factors icy hot, heating patch            [facility] PT Assessment - 03/26/16 0001      AROM   Cervical Flexion 50   Cervical Extension 46   Cervical - Right Side Bend WNL   Cervical - Left Side Bend WNL   Cervical - Right Rotation 52   Cervical - Left Rotation 50                     [facility] Adult PT Treatment/Exercise - 03/26/16 0001      Self-Care   Self-Care Other Self-Care Comments   Other Self-Care Comments  body mechanics for stretching and transfers      Neck Exercises: Seated   Neck Retraction 10 reps     Knee/Hip Exercises: Supine   Other Supine Knee/Hip Exercises knee  to chest 3 x 30 sec each     Moist Heat Therapy   Number Minutes Moist Heat 15 Minutes   Moist Heat Location Cervical     Manual Therapy   Soft tissue mobilization levator and upper trap, cervcical and thoracic paraspinals in supine     Neck Exercises: Stretches   Upper Trapezius Stretch 3 reps;30 seconds   Levator Stretch 3 reps;30 seconds                  PT Short Term Goals - 03/26/16 0913      PT SHORT TERM GOAL #1   Title Patient will report 2/10 pain at worst in the left elbow    Status Achieved     PT SHORT TERM GOAL #2   Title Patient will demsotrate pain free elbow flexion and extension on the left    Status Achieved     PT SHORT TERM GOAL #3   Title Patient will increase left elbow strength to 5/5    Status Achieved       PT SHORT TERM GOAL #4   Title Patient will be I w/ HEP    Status Achieved     PT SHORT TERM GOAL #5   Title Patient will increase left cervical trotation by 10 degrees    Baseline right improved 12 degrees, left improved 5 degrees 03/26/2016   Time 4   Period Weeks   Status Partially Met           PT Long Term Goals - 03/18/16 1523      PT LONG TERM GOAL #1   Title 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    Time 8   Period Weeks   Status On-going               Plan - 03/26/16 0914    Clinical Impression Statement STG# 5 partially MET. Cervical AROM improved all planes. Pt reports  increased low back pain yesterday and today. She reports attempting to lean forward and touch her toes to relieve pain. Reviewed body mechanics with her as well as log roll due to her demonstrating poor mechanics in clinic. Soft tissue work to neck musculature with pt reporting immediate loosening.    PT Next Visit Plan DN of upper trap, stretching, posture/ lifting and carrying exercises, manual on neck muscles. ; REVIEW neck stretches to continue after dry needling.       Patient will benefit from skilled therapeutic intervention in order to improve the following deficits and impairments:  Abnormal gait, Decreased activity tolerance, Decreased mobility, Decreased strength, Increased muscle spasms, Pain  Visit Diagnosis: Pain in left elbow  Muscle weakness (generalized)  Cervicalgia     Problem List Patient Active Problem List   Diagnosis Date Noted  . Pes planus, flexible 10/20/2013    [name], [name], PTA 03/26/2016, 10:46 AM  [facility] [address] [place], [place], [address] Phone: [phone]   Fax:  [phone]  Name: Carol [name] MRN: [id number] Date of Birth: 04/26/1966    

## 2016-03-27 ENCOUNTER — Ambulatory Visit: Payer: BLUE CROSS/BLUE SHIELD | Admitting: Physical Therapy

## 2016-03-27 DIAGNOSIS — M25522 Pain in left elbow: Secondary | ICD-10-CM

## 2016-03-27 DIAGNOSIS — M542 Cervicalgia: Secondary | ICD-10-CM

## 2016-03-27 DIAGNOSIS — M6281 Muscle weakness (generalized): Secondary | ICD-10-CM

## 2016-03-27 NOTE — Therapy (Addendum)
[place], [place], [address] Phone: [phone]   Fax:  [phone]  Physical Therapy Treatment / Discharge Note  Patient Details  Name: CANDIA KINGSBURY MRN: [id number] Date of Birth: 15-Mar-1966 Referring Provider: Chana Bode PA   Encounter Date: 03/27/2016      PT End of Session - 03/27/16 0836    Visit Number 9   Number of Visits 16   Date for PT Re-Evaluation 04/22/16   PT Start Time 0802   PT Stop Time 0853   PT Time Calculation (min) 51 min   Activity Tolerance Patient tolerated treatment well   Behavior During Therapy [facility] for tasks assessed/performed      Past Medical History:  Diagnosis Date  . Back pain   . Bronchitis   . History of blood in urine    microscopic  . History of migraine headaches   . Hypertension   . Impaired fasting glucose     Past Surgical History:  Procedure Laterality Date  . ABDOMINAL HYSTERECTOMY  2002  . CHOLECYSTECTOMY    . TUBAL LIGATION      There were no vitals filed for this visit.      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   Currently in Pain? Yes   Pain Score 6    Pain Location Neck   Pain Orientation Mid;Right   Pain Type Chronic pain   Pain Onset More than a month ago   Pain Frequency Constant                         [facility] Adult PT Treatment/Exercise - 03/27/16 0832      Shoulder Exercises: Standing   Other Standing Exercises lifting and carrying techniques using box with no weight (box ~5#) walking 5 ft, and lifting from shelf just above waist height with cues to avoid hiking shoulders 2 x 10  also shelf just below shoulder height 1 x 10     Shoulder Exercises: ROM/Strengthening   UBE (Upper Arm Bike) L1 x 6 min  changing direction at 3 min     Moist Heat Therapy   Number Minutes Moist Heat 10  Minutes   Moist Heat Location Cervical     Manual Therapy   Soft tissue mobilization along R upper trap/ levator scap with pt in prone   Myofascial Release gentle rolling of fascia over upper trap/ levator scapulae on R     Neck Exercises: Stretches   Upper Trapezius Stretch 3 reps;30 seconds  contract / relax with 10 sec contract    Levator Stretch 2 reps;30 seconds          Trigger Point Dry Needling - 03/27/16 0809    Consent Given? Yes   Education Handout Provided No  given previously   Muscles Treated Upper Body Upper trapezius   Upper Trapezius Response Twitch reponse elicited;Palpable increased muscle length  2 x on R with pistoning technique              PT Education - 03/27/16 0836    Education provided Yes   Education Details carrying and lifting mechanics from shelfs that are above waist height to avoid hiking shoulders or posterior trunk lean to using bending knees   Person(s) Educated Patient   Methods Explanation;Verbal cues  Comprehension Verbalized understanding;Verbal cues required          PT Short Term Goals - 03/26/16 0913      PT SHORT TERM GOAL #1   Title Patient will report 2/10 pain at worst in the left elbow    Status Achieved     PT SHORT TERM GOAL #2   Title Patient will demsotrate pain free elbow flexion and extension on the left    Status Achieved     PT SHORT TERM GOAL #3   Title Patient will increase left elbow strength to 5/5    Status Achieved     PT SHORT TERM GOAL #4   Title Patient will be I w/ HEP    Status Achieved     PT SHORT TERM GOAL #5   Title Patient will increase left cervical trotation by 10 degrees    Baseline right improved 12 degrees, left improved 5 degrees 03/26/2016   Time 4   Period Weeks   Status Partially Met           PT Long Term Goals - 03/18/16 1523      PT LONG TERM GOAL #1   Title 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    Time 8   Period Weeks   Status On-going               Plan - 03/27/16 1610    Clinical Impression Statement Mrs. Mcqueen states she has soreness in the R upper trap following work last night. DN was performed on R upper trap; pt monitored thorughout treatment. follwoing Soft tissue work and stretching she demonstrated improved cervical mobility and decreased tension. worked on lifting and carrying mechanics from shelf heights that just above waist heigh to mimic work related conditions. MHP post session for soreness.    PT Next Visit Plan assess response to DN,stretching, posture/ lifting and carrying exercises, manual on neck muscles, upper trap stretching   Consulted and Agree with Plan of Care Patient      Patient will benefit from skilled therapeutic intervention in order to improve the following deficits and impairments:     Visit Diagnosis: Pain in left elbow  Muscle weakness (generalized)  Cervicalgia     Problem List Patient Active Problem List   Diagnosis Date Noted  . 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 Patient is being discharged due to not returning since the last visit.  ?????     Sriman Tally PT, DPT, LAT, ATC  05/22/16  1:25 PM

## 2016-04-01 ENCOUNTER — Ambulatory Visit: Payer: BLUE CROSS/BLUE SHIELD | Admitting: Physical Therapy

## 2016-04-09 ENCOUNTER — Ambulatory Visit: Payer: BLUE CROSS/BLUE SHIELD | Admitting: Physical Therapy

## 2016-06-04 ENCOUNTER — Ambulatory Visit: Payer: BLUE CROSS/BLUE SHIELD | Admitting: Urology

## 2016-07-18 ENCOUNTER — Emergency Department (HOSPITAL_COMMUNITY): Payer: BLUE CROSS/BLUE SHIELD

## 2016-07-18 ENCOUNTER — Encounter (HOSPITAL_COMMUNITY): Payer: Self-pay | Admitting: *Deleted

## 2016-07-18 ENCOUNTER — Emergency Department (HOSPITAL_COMMUNITY)
Admission: EM | Admit: 2016-07-18 | Discharge: 2016-07-19 | Disposition: A | Discharge status: Home / Self Care | Payer: BLUE CROSS/BLUE SHIELD | Attending: Emergency Medicine | Admitting: Emergency Medicine

## 2016-07-18 DIAGNOSIS — M545 Low back pain, unspecified: Secondary | ICD-10-CM

## 2016-07-18 DIAGNOSIS — F17210 Nicotine dependence, cigarettes, uncomplicated: Secondary | ICD-10-CM | POA: Insufficient documentation

## 2016-07-18 DIAGNOSIS — F17290 Nicotine dependence, other tobacco product, uncomplicated: Secondary | ICD-10-CM | POA: Diagnosis not present

## 2016-07-18 DIAGNOSIS — R1030 Lower abdominal pain, unspecified: Secondary | ICD-10-CM | POA: Insufficient documentation

## 2016-07-18 DIAGNOSIS — I10 Essential (primary) hypertension: Secondary | ICD-10-CM | POA: Diagnosis not present

## 2016-07-18 HISTORY — DX: Cervicalgia: M54.2

## 2016-07-18 LAB — URINALYSIS, ROUTINE W REFLEX MICROSCOPIC
Bilirubin Urine: NEGATIVE
GLUCOSE, UA: NEGATIVE mg/dL
KETONES UR: NEGATIVE mg/dL
Leukocytes, UA: NEGATIVE
Nitrite: NEGATIVE
PH: 6.5 (ref 5.0–8.0)
PROTEIN: NEGATIVE mg/dL
Specific Gravity, Urine: 1.01 (ref 1.005–1.030)

## 2016-07-18 LAB — URINE MICROSCOPIC-ADD ON

## 2016-07-18 MED ORDER — FENTANYL CITRATE (PF) 100 MCG/2ML IJ SOLN
50.0000 ug | INTRAMUSCULAR | Status: DC | PRN
  Administered 2016-07-18: 50 ug via INTRAVENOUS
  Filled 2016-07-18: qty 2

## 2016-07-18 MED ORDER — ONDANSETRON HCL 4 MG/2ML IJ SOLN: 4.0000 mg | Freq: Once | INTRAMUSCULAR | Status: DC

## 2016-07-18 MED ORDER — FENTANYL CITRATE (PF) 100 MCG/2ML IJ SOLN
INTRAMUSCULAR | Status: AC
  Administered 2016-07-18: 100 ug
  Filled 2016-07-18: qty 2

## 2016-07-18 NOTE — ED Triage Notes (Signed)
States she is being treated for a UTI AND IS NOT GETTING BETTER, C/O SEVERE BACK PAIN

## 2016-07-18 NOTE — ED Provider Notes (Signed)
[facility] DEPT Provider Note   CSN: [id number] Arrival date & time: 07/18/16  1932     History   Chief Complaint Chief Complaint  Patient presents with  . Back Pain    HPI Carol [name] is a 50 y.o. female.  HPI  Pt was seen at 2200. Per pt, c/o gradual onset and persistence of constant low back "pain" for the past 5 days. Pain worsens with palpation of the area and body position changes. Denies incont/retention of bowel or bladder, no saddle anesthesia, no focal motor weakness, no tingling/numbness in extremities, no fevers, no injury, no abd pain. The symptoms have been associated with no other complaints. The patient has a significant history of similar symptoms previously, recently being evaluated for this complaint. Pt states she was evaluated at a local [facility] 4 days ago, and was dx with UTI and rx antibiotics. Pt denies any change in her symptoms. Denies dysuria/hematuria, no N/V/D.    Past Medical History:  Diagnosis Date  . Back pain   . Bronchitis   . Cervicalgia   . History of blood in urine    microscopic  . History of migraine headaches   . Hypertension   . Impaired fasting glucose     Patient Active Problem List   Diagnosis Date Noted  . 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 ABDOMINAL HYSTERECTOMY  2002  . CHOLECYSTECTOMY    . TUBAL LIGATION      OB History    No data available       Home Medications    Prior to Admission medications   Medication Sig Start Date End Date Taking? Authorizing Provider  nitrofurantoin, macrocrystal-monohydrate, (MACROBID) 100 MG capsule Take 100 mg by mouth 2 (two) times daily. 5 day course starting on 07/16/2016 07/16/16  Yes Historical Provider, MD    Family History Family History  Problem Relation Age of Onset  . Arthritis Paternal Grandmother   . Diabetes Paternal Grandmother   . Hypertension Paternal Grandmother   . Stroke Paternal Grandmother   .  Diabetes Mother   . Colon cancer Neg Hx     Social History Social History  Substance Use Topics  . Smoking status: Current Every Day Smoker    Packs/day: 0.50    Years: 20.00    Types: Cigarettes, Cigars  . Smokeless tobacco: Never Used     Comment: Tobacco info given  . Alcohol use Yes     Comment: occ.     Allergies   Naftin [naftifine hcl]   Review of Systems Review of Systems ROS: Statement: All systems negative except as marked or noted in the HPI; Constitutional: Negative for fever and chills. ; ; Eyes: Negative for eye pain, redness and discharge. ; ; ENMT: Negative for ear pain, hoarseness, nasal congestion, sinus pressure and sore throat. ; ; Cardiovascular: Negative for chest pain, palpitations, diaphoresis, dyspnea and peripheral edema. ; ; Respiratory: Negative for cough, wheezing and stridor. ; ; Gastrointestinal: Negative for nausea, vomiting, diarrhea, abdominal pain, blood in stool, hematemesis, jaundice and rectal bleeding. . ; ; Genitourinary: Negative for dysuria and hematuria. ; ; Musculoskeletal: +LBP. Negative for neck pain. Negative for swelling and trauma.; ; Skin: Negative for pruritus, rash, abrasions, blisters, bruising and skin lesion.; ; Neuro: Negative for headache, lightheadedness and neck stiffness. Negative for weakness, altered level of consciousness, altered mental status, extremity weakness, paresthesias, involuntary movement, seizure and syncope.     Physical  Exam Updated Vital Signs BP (!) 156/50 (BP Location: Left Arm)   Pulse 78   Temp 97.6 F (36.4 C) (Oral)   Resp 20   Wt 165 lb (74.8 kg)   SpO2 100%   BMI 25.09 kg/m   Physical Exam 2205: Physical examination:  Nursing notes reviewed; Vital signs and O2 SAT reviewed;  Constitutional: Well developed, Well nourished, Well hydrated, In no acute distress; Head:  Normocephalic, atraumatic; Eyes: EOMI, PERRL, No scleral icterus; ENMT: Mouth and pharynx normal, Mucous membranes moist; Neck:  Supple, Full range of motion, No lymphadenopathy; Cardiovascular: Regular rate and rhythm, No murmur, rub, or gallop; Respiratory: Breath sounds clear & equal bilaterally, No rales, rhonchi, wheezes.  Speaking full sentences with ease, Normal respiratory effort/excursion; Chest: Nontender, Movement normal; Abdomen: Soft, Nontender, Nondistended, Normal bowel sounds; Genitourinary: No CVA tenderness; Spine:  No midline CS, TS, LS tenderness. +TTP bilat lumbar paraspinal muscles. No rash.;; Extremities: Pulses normal, No tenderness, No edema, No calf edema or asymmetry.; Neuro: AA&Ox3, Major CN grossly intact.  Speech clear. No gross focal motor or sensory deficits in extremities. Climbs on and off stretcher easily by herself. Gait steady.; Skin: Color normal, Warm, Dry.   ED Treatments / Results  Labs (all labs ordered are listed, but only abnormal results are displayed)   EKG  EKG Interpretation None       Radiology   Procedures Procedures (including critical care time)  Medications Ordered in ED Medications  fentaNYL (SUBLIMAZE) injection 50 mcg (not administered)  fentaNYL (SUBLIMAZE) 100 MCG/2ML injection (100 mcg  Given 07/18/16 2134)     Initial Impression / Assessment and Plan / ED Course  I have reviewed the triage vital signs and the nursing notes.  Pertinent labs & imaging results that were available during my care of the patient were reviewed by me and considered in my medical decision making (see chart for details).  MDM Reviewed: previous chart, nursing note and vitals Reviewed previous: labs Interpretation: labs and CT scan    Results for orders placed or performed during the hospital encounter of 07/18/16  Urinalysis, Routine w reflex microscopic (not at [facility])  Result Value Ref Range   Color, Urine YELLOW YELLOW   APPearance CLEAR CLEAR   Specific Gravity, Urine 1.010 1.005 - 1.030   pH 6.5 5.0 - 8.0   Glucose, UA NEGATIVE NEGATIVE mg/dL   Hgb urine  dipstick MODERATE (A) NEGATIVE   Bilirubin Urine NEGATIVE NEGATIVE   Ketones, ur NEGATIVE NEGATIVE mg/dL   Protein, ur NEGATIVE NEGATIVE mg/dL   Nitrite NEGATIVE NEGATIVE   Leukocytes, UA NEGATIVE NEGATIVE  Urine microscopic-add on  Result Value Ref Range   Squamous Epithelial / LPF 6-30 (A) NONE SEEN   WBC, UA 0-5 0 - 5 WBC/hpf   RBC / HPF 6-30 0 - 5 RBC/hpf   Bacteria, UA FEW (A) NONE SEEN    0040:  CT A/P pending. Sign out to Dr. Venora Maples.     Final Clinical Impressions(s) / ED Diagnoses   Final diagnoses:  None    New Prescriptions New Prescriptions   No medications on file     Francine Graven, DO 07/19/16 0040

## 2016-07-19 MED ORDER — IBUPROFEN 400 MG PO TABS: 400.0000 mg | ORAL_TABLET | Freq: Three times a day (TID) | ORAL | 0 refills | Status: DC | PRN

## 2016-07-19 MED ORDER — KETOROLAC TROMETHAMINE 30 MG/ML IJ SOLN
30.0000 mg | Freq: Once | INTRAMUSCULAR | Status: AC
  Administered 2016-07-19: 30 mg via INTRAVENOUS

## 2016-07-19 MED ORDER — CYCLOBENZAPRINE HCL 10 MG PO TABS
ORAL_TABLET | ORAL | Status: AC
  Filled 2016-07-19: qty 1

## 2016-07-19 MED ORDER — CYCLOBENZAPRINE HCL 10 MG PO TABS: 10.0000 mg | ORAL_TABLET | Freq: Three times a day (TID) | ORAL | 0 refills | Status: DC | PRN

## 2016-07-19 MED ORDER — KETOROLAC TROMETHAMINE 30 MG/ML IJ SOLN
INTRAMUSCULAR | Status: AC
  Filled 2016-07-19: qty 1

## 2016-07-19 MED ORDER — CYCLOBENZAPRINE HCL 10 MG PO TABS
10.0000 mg | ORAL_TABLET | Freq: Once | ORAL | Status: AC
  Administered 2016-07-19: 10 mg via ORAL

## 2016-07-19 MED ORDER — OXYCODONE-ACETAMINOPHEN 5-325 MG PO TABS
1.0000 | ORAL_TABLET | Freq: Once | ORAL | Status: AC
  Administered 2016-07-19: 1 via ORAL

## 2016-07-19 MED ORDER — OXYCODONE-ACETAMINOPHEN 5-325 MG PO TABS
ORAL_TABLET | ORAL | Status: AC
  Filled 2016-07-19: qty 1

## 2016-07-19 NOTE — ED Provider Notes (Signed)
1:11 AM CT scan without ureteral stone.  Likely muscular skeletal pain.  Urine without overt signs of infection.  Doubt pyelonephritis.  Well-appearing.  Nontoxic.  Vital signs are stable.  Discharge home in good condition with primary care follow-up.  She understands return to the ER for new or worsening symptoms  Ct Renal Stone Study  Result Date: 07/19/2016 CLINICAL DATA:  Acute onset of bilateral lower abdominal pain, radiating to both flanks. Initial encounter. EXAM: CT ABDOMEN AND PELVIS WITHOUT CONTRAST TECHNIQUE: Multidetector CT imaging of the abdomen and pelvis was performed following the standard protocol without IV contrast. COMPARISON:  CT of the abdomen and pelvis from 09/11/2013 FINDINGS: Lower chest: The visualized lung bases are grossly clear. The visualized portions of the mediastinum are unremarkable. Hepatobiliary: The liver is unremarkable in appearance. The patient is status post cholecystectomy, with clips noted at the gallbladder fossa. The common bile duct remains normal in caliber. Pancreas: The pancreas is within normal limits. Spleen: The spleen is unremarkable in appearance. Adrenals/Urinary Tract: The adrenal glands are unremarkable in appearance. The kidneys are within normal limits. There is no evidence of hydronephrosis. No renal or ureteral stones are identified. No perinephric stranding is seen. Stomach/Bowel: The stomach is unremarkable in appearance. The small bowel is within normal limits. The appendix is normal in caliber, without evidence of appendicitis. The colon is unremarkable in appearance. Vascular/Lymphatic: Minimal calcification is seen at the distal abdominal aorta. No retroperitoneal or pelvic sidewall lymphadenopathy is seen. Reproductive: The bladder is mildly distended and grossly unremarkable. The patient is status post hysterectomy. No suspicious adnexal masses are seen. The ovaries are relatively symmetric. Other: No additional soft tissue abnormalities  are seen. Musculoskeletal: No acute osseous abnormalities are identified. The visualized musculature is unremarkable in appearance. IMPRESSION: No acute abnormality seen within the abdomen or pelvis. Electronically Signed   By: Garald Balding M.D.   On: 07/19/2016 00:56    I personally reviewed the imaging tests through PACS system I reviewed available ER/hospitalization records through the [facility], MD 07/19/16 [phone]

## 2016-07-21 LAB — URINE CULTURE

## 2016-07-22 ENCOUNTER — Emergency Department (HOSPITAL_COMMUNITY)
Admission: EM | Admit: 2016-07-22 | Discharge: 2016-07-22 | Disposition: A | Discharge status: Home / Self Care | Payer: BLUE CROSS/BLUE SHIELD | Attending: Emergency Medicine | Admitting: Emergency Medicine

## 2016-07-22 ENCOUNTER — Encounter (HOSPITAL_COMMUNITY): Payer: Self-pay | Admitting: *Deleted

## 2016-07-22 ENCOUNTER — Emergency Department (HOSPITAL_COMMUNITY): Payer: BLUE CROSS/BLUE SHIELD

## 2016-07-22 DIAGNOSIS — I10 Essential (primary) hypertension: Secondary | ICD-10-CM | POA: Diagnosis not present

## 2016-07-22 DIAGNOSIS — F17290 Nicotine dependence, other tobacco product, uncomplicated: Secondary | ICD-10-CM | POA: Insufficient documentation

## 2016-07-22 DIAGNOSIS — Z791 Long term (current) use of non-steroidal anti-inflammatories (NSAID): Secondary | ICD-10-CM | POA: Diagnosis not present

## 2016-07-22 DIAGNOSIS — M545 Low back pain, unspecified: Secondary | ICD-10-CM

## 2016-07-22 DIAGNOSIS — R319 Hematuria, unspecified: Secondary | ICD-10-CM | POA: Diagnosis not present

## 2016-07-22 LAB — CBC WITH DIFFERENTIAL/PLATELET
BASOS PCT: 0 %
Basophils Absolute: 0 10*3/uL (ref 0.0–0.1)
EOS ABS: 0.2 10*3/uL (ref 0.0–0.7)
Eosinophils Relative: 3 %
HCT: 34.7 % — ABNORMAL LOW (ref 36.0–46.0)
HEMOGLOBIN: 11.8 g/dL — AB (ref 12.0–15.0)
LYMPHS ABS: 3 10*3/uL (ref 0.7–4.0)
Lymphocytes Relative: 37 %
MCH: 28.8 pg (ref 26.0–34.0)
MCHC: 34 g/dL (ref 30.0–36.0)
MCV: 84.6 fL (ref 78.0–100.0)
MONO ABS: 0.7 10*3/uL (ref 0.1–1.0)
MONOS PCT: 8 %
NEUTROS PCT: 52 %
Neutro Abs: 4.2 10*3/uL (ref 1.7–7.7)
Platelets: 310 10*3/uL (ref 150–400)
RBC: 4.1 MIL/uL (ref 3.87–5.11)
RDW: 12.7 % (ref 11.5–15.5)
WBC: 8.1 10*3/uL (ref 4.0–10.5)

## 2016-07-22 LAB — URINALYSIS, ROUTINE W REFLEX MICROSCOPIC
BILIRUBIN URINE: NEGATIVE
Glucose, UA: NEGATIVE mg/dL
KETONES UR: NEGATIVE mg/dL
NITRITE: NEGATIVE
PH: 6 (ref 5.0–8.0)
Protein, ur: NEGATIVE mg/dL
SPECIFIC GRAVITY, URINE: 1.02 (ref 1.005–1.030)

## 2016-07-22 LAB — COMPREHENSIVE METABOLIC PANEL
ALBUMIN: 3.6 g/dL (ref 3.5–5.0)
ALK PHOS: 90 U/L (ref 38–126)
ALT: 16 U/L (ref 14–54)
AST: 18 U/L (ref 15–41)
Anion gap: 6 (ref 5–15)
BUN: 11 mg/dL (ref 6–20)
CALCIUM: 8.7 mg/dL — AB (ref 8.9–10.3)
CHLORIDE: 105 mmol/L (ref 101–111)
CO2: 27 mmol/L (ref 22–32)
CREATININE: 0.59 mg/dL (ref 0.44–1.00)
GFR calc non Af Amer: >60 mL/min (ref >60)
GLUCOSE: 150 mg/dL — AB (ref 65–99)
Potassium: 3.4 mmol/L — ABNORMAL LOW (ref 3.5–5.1)
SODIUM: 138 mmol/L (ref 135–145)
Total Bilirubin: 0.2 mg/dL — ABNORMAL LOW (ref 0.3–1.2)
Total Protein: 7 g/dL (ref 6.5–8.1)

## 2016-07-22 LAB — URINALYSIS, MICROSCOPIC (REFLEX)

## 2016-07-22 MED ORDER — CYCLOBENZAPRINE HCL 10 MG PO TABS: 10.0000 mg | ORAL_TABLET | Freq: Three times a day (TID) | ORAL | 0 refills | Status: DC | PRN

## 2016-07-22 MED ORDER — CEPHALEXIN 500 MG PO CAPS
500.0000 mg | ORAL_CAPSULE | Freq: Four times a day (QID) | ORAL | 0 refills | Status: DC
Start: 2016-07-22 — End: 2016-08-24

## 2016-07-22 MED ORDER — HYDROMORPHONE HCL 1 MG/ML IJ SOLN
1.0000 mg | Freq: Once | INTRAMUSCULAR | Status: AC
  Administered 2016-07-22: 1 mg via INTRAMUSCULAR

## 2016-07-22 MED ORDER — HYDROCODONE-ACETAMINOPHEN 7.5-325 MG PO TABS: 1.0000 | ORAL_TABLET | Freq: Four times a day (QID) | ORAL | 0 refills | Status: DC | PRN

## 2016-07-22 NOTE — ED Triage Notes (Signed)
Pt c/o mid to low back pain that started last week. Denies injury. Pt was seen at [facility] last Friday and diagnosed with muscle spams and given Flexeril and Ibuprofen. Pt has been taking medications with only slight relief.

## 2016-07-22 NOTE — Discharge Instructions (Signed)
Drink plenty of water.  Take the antibiotic as directed.  Follow-up with the doctors listed.  Call to arrange an appt.

## 2016-07-24 LAB — URINE CULTURE: CULTURE: NO GROWTH

## 2016-07-26 NOTE — ED Provider Notes (Signed)
[facility] DEPT Provider Note   CSN: [id number] Arrival date & time: 07/22/16  1828     History   Chief Complaint Chief Complaint  Patient presents with  . Back Pain    HPI Carol [name] is a 50 y.o. female.  HPI   Carol [name] is a 50 y.o. female who presents to the Emergency Department complaining of low back pain for one week.  She was seen here last week at onset and given ibuprofen and flexeril which she is taking with only minimal relief.  She describes an aching pain across her lower back that is worse with movement.  She states that she previously diagnosed with a UTI.  She took antibiotics without change of her symptoms.  She also states that she had a CT scan that was negative for kidney stone.  She denies fever, chills, abdominal pain, nausea and vomiting, numbness or weakness of the LE's and urine or bladder changes.   Past Medical History:  Diagnosis Date  . Back pain   . Bronchitis   . Cervicalgia   . History of blood in urine    microscopic  . History of migraine headaches   . Hypertension   . Impaired fasting glucose     Patient Active Problem List   Diagnosis Date Noted  . 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 ABDOMINAL HYSTERECTOMY  2002  . CHOLECYSTECTOMY    . TUBAL LIGATION      OB History    No data available       Home Medications    Prior to Admission medications   Medication Sig Start Date End Date Taking? Authorizing Provider  ibuprofen (ADVIL,MOTRIN) 400 MG tablet Take 1 tablet (400 mg total) by mouth every 8 (eight) hours as needed. 07/19/16  Yes Jola Schmidt, MD  cephALEXin (KEFLEX) 500 MG capsule Take 1 capsule (500 mg total) by mouth 4 (four) times daily. for 7 days 07/22/16   Abbegayle Denault, PA-C  cyclobenzaprine (FLEXERIL) 10 MG tablet Take 1 tablet (10 mg total) by mouth 3 (three) times daily as needed. 07/22/16   Alejandrina Raimer, PA-C  HYDROcodone-acetaminophen (NORCO)  7.5-325 MG tablet Take 1 tablet by mouth every 6 (six) hours as needed for moderate pain. 07/22/16   Lovis More, PA-C    Family History Family History  Problem Relation Age of Onset  . Arthritis Paternal Grandmother   . Diabetes Paternal Grandmother   . Hypertension Paternal Grandmother   . Stroke Paternal Grandmother   . Diabetes Mother   . Colon cancer Neg Hx     Social History Social History  Substance Use Topics  . Smoking status: Current Every Day Smoker    Packs/day: 0.50    Years: 20.00    Types: Cigarettes, Cigars  . Smokeless tobacco: Never Used     Comment: Tobacco info given  . Alcohol use Yes     Comment: occ.     Allergies   Naftin [naftifine hcl]   Review of Systems Review of Systems  Constitutional: Negative for fever.  Respiratory: Negative for shortness of breath.   Gastrointestinal: Negative for abdominal pain, constipation and vomiting.  Genitourinary: Negative for decreased urine volume, difficulty urinating, dysuria, flank pain and hematuria.  Musculoskeletal: Positive for back pain. Negative for joint swelling.  Skin: Negative for rash.  Neurological: Negative for weakness and numbness.  All other systems reviewed and are negative.    Physical Exam  Updated Vital Signs BP 138/78 (BP Location: Right Arm)   Pulse 80   Temp 98.2 F (36.8 C) (Oral)   Resp 16   Ht 5' 7.5" (1.715 m)   Wt 74.8 kg   SpO2 97%   BMI 25.46 kg/m   Physical Exam  Constitutional: She is oriented to person, place, and time. She appears well-developed and well-nourished. No distress.  HENT:  Head: Normocephalic and atraumatic.  Neck: Normal range of motion. Neck supple.  Cardiovascular: Normal rate, regular rhythm and intact distal pulses.   No murmur heard. Pulmonary/Chest: Effort normal and breath sounds normal. No respiratory distress.  Abdominal: Soft. She exhibits no distension. There is no tenderness. There is no CVA tenderness.  Musculoskeletal: She  exhibits tenderness. She exhibits no edema.       Lumbar back: She exhibits tenderness and pain. She exhibits normal range of motion, no swelling, no deformity, no laceration and normal pulse.  ttp of the bilateral lumbar paraspinal muscles.  No spinal tenderness.  DP pulses are brisk and symmetrical.  Distal sensation intact.  Pt has 5/5 strength against resistance of bilateral lower extremities.     Neurological: She is alert and oriented to person, place, and time. She has normal strength. No sensory deficit. She exhibits normal muscle tone. Coordination and gait normal.  Reflex Scores:      Patellar reflexes are 2+ on the right side and 2+ on the left side.      Achilles reflexes are 2+ on the right side and 2+ on the left side. Skin: Skin is warm and dry. No rash noted.  Nursing note and vitals reviewed.    ED Treatments / Results  Labs (all labs ordered are listed, but only abnormal results are displayed) Labs Reviewed  COMPREHENSIVE METABOLIC PANEL - Abnormal; Notable for the following:       Result Value   Potassium 3.4 (*)    Glucose, Bld 150 (*)    Calcium 8.7 (*)    Total Bilirubin 0.2 (*)    All other components within normal limits  CBC WITH DIFFERENTIAL/PLATELET - Abnormal; Notable for the following:    Hemoglobin 11.8 (*)    HCT 34.7 (*)    All other components within normal limits  URINALYSIS, ROUTINE W REFLEX MICROSCOPIC - Abnormal; Notable for the following:    Hgb urine dipstick MODERATE (*)    Leukocytes, UA TRACE (*)    All other components within normal limits  URINALYSIS, MICROSCOPIC (REFLEX) - Abnormal; Notable for the following:    Bacteria, UA FEW (*)    Squamous Epithelial / LPF 6-30 (*)    All other components within normal limits  URINE CULTURE    EKG  EKG Interpretation None       Radiology No results found.  Procedures Procedures (including critical care time)  Medications Ordered in ED Medications  HYDROmorphone (DILAUDID)  injection 1 mg (1 mg Intramuscular Given 07/22/16 2115)     Initial Impression / Assessment and Plan / ED Course  I have reviewed the triage vital signs and the nursing notes.  Pertinent labs & imaging results that were available during my care of the patient were reviewed by me and considered in my medical decision making (see chart for details).  Clinical Course     Pt well appearing, vitals stable.  No focal neuro deficits, no concerning sx's for emergent neurological process.  Pt continues to have hematuria w/o CT evidence of kidney stone.  Pt agrees to  tx plan which includes keflex and hydrocodone.  Urine culture pending.  Pt agrees to urology f/u.    Final Clinical Impressions(s) / ED Diagnoses   Final diagnoses:  Low back pain without sciatica, unspecified back pain laterality, unspecified chronicity  Hematuria, unspecified type    New Prescriptions Discharge Medication List as of 07/22/2016 11:27 PM    START taking these medications   Details  cephALEXin (KEFLEX) 500 MG capsule Take 1 capsule (500 mg total) by mouth 4 (four) times daily. for 7 days, Starting Tue 07/22/2016, Print    HYDROcodone-acetaminophen (NORCO) 7.5-325 MG tablet Take 1 tablet by mouth every 6 (six) hours as needed for moderate pain., Starting Tue 07/22/2016, Print         Syrus Nakama [place], PA-C 07/26/16 2228    Julianne Rice, MD 07/27/16 2132

## 2016-07-29 ENCOUNTER — Encounter: Payer: Self-pay | Admitting: Orthopaedic Surgery

## 2016-07-29 ENCOUNTER — Ambulatory Visit (INDEPENDENT_AMBULATORY_CARE_PROVIDER_SITE_OTHER): Payer: BLUE CROSS/BLUE SHIELD | Admitting: Orthopaedic Surgery

## 2016-07-29 VITALS — BP 123/90 | HR 99 | Temp 97.7°F | Ht 69.5 in | Wt 172.0 lb

## 2016-07-29 DIAGNOSIS — G8929 Other chronic pain: Secondary | ICD-10-CM

## 2016-07-29 DIAGNOSIS — M545 Low back pain: Secondary | ICD-10-CM

## 2016-07-29 DIAGNOSIS — F17210 Nicotine dependence, cigarettes, uncomplicated: Secondary | ICD-10-CM

## 2016-07-29 MED ORDER — NAPROXEN 500 MG PO TABS: 500.0000 mg | ORAL_TABLET | Freq: Two times a day (BID) | ORAL | 5 refills | Status: DC

## 2016-07-29 MED ORDER — HYDROCODONE-ACETAMINOPHEN 7.5-325 MG PO TABS: 1.0000 | ORAL_TABLET | ORAL | 0 refills | Status: DC | PRN

## 2016-07-29 NOTE — Patient Instructions (Addendum)
Work note:  No lifting over 25 lbs.     Steps to Quit Smoking Smoking tobacco can be bad for your health. It can also affect almost every organ in your body. Smoking puts you and people around you at risk for many serious long-lasting (chronic) diseases. Quitting smoking is hard, but it is one of the best things that you can do for your health. It is never too late to quit. What are the benefits of quitting smoking? When you quit smoking, you lower your risk for getting serious diseases and conditions. They can include:  Lung cancer or lung disease.  Heart disease.  Stroke.  Heart attack.  Not being able to have children (infertility).  Weak bones (osteoporosis) and broken bones (fractures). If you have coughing, wheezing, and shortness of breath, those symptoms may get better when you quit. You may also get sick less often. If you are pregnant, quitting smoking can help to lower your chances of having a baby of low birth weight. What can I do to help me quit smoking? Talk with your doctor about what can help you quit smoking. Some things you can do (strategies) include:  Quitting smoking totally, instead of slowly cutting back how much you smoke over a period of time.  Going to in-person counseling. You are more likely to quit if you go to many counseling sessions.  Using resources and support systems, such as:  Online chats with a Social worker.  Phone quitlines.  Printed Furniture conservator/restorer.  Support groups or group counseling.  Text messaging programs.  Mobile phone apps or applications.  Taking medicines. Some of these medicines may have nicotine in them. If you are pregnant or breastfeeding, do not take any medicines to quit smoking unless your doctor says it is okay. Talk with your doctor about counseling or other things that can help you. Talk with your doctor about using more than one strategy at the same time, such as taking medicines while you are also going to  in-person counseling. This can help make quitting easier. What things can I do to make it easier to quit? Quitting smoking might feel very hard at first, but there is a lot that you can do to make it easier. Take these steps:  Talk to your family and friends. Ask them to support and encourage you.  Call phone quitlines, reach out to support groups, or work with a Social worker.  Ask people who smoke to not smoke around you.  Avoid places that make you want (trigger) to smoke, such as:  Bars.  Parties.  Smoke-break areas at work.  Spend time with people who do not smoke.  Lower the stress in your life. Stress can make you want to smoke. Try these things to help your stress:  Getting regular exercise.  Deep-breathing exercises.  Yoga.  Meditating.  Doing a body scan. To do this, close your eyes, focus on one area of your body at a time from head to toe, and notice which parts of your body are tense. Try to relax the muscles in those areas.  Download or buy apps on your mobile phone or tablet that can help you stick to your quit plan. There are many free apps, such as QuitGuide from the [employer] Office manager for Disease Control and Prevention). You can find more support from smokefree.gov and other websites. This information is not intended to replace advice given to you by your health care provider. Make sure you discuss any questions you have  with your health care provider. Document Released: 05/31/2009 Document Revised: 04/01/2016 Document Reviewed: 12/19/2014 Elsevier Interactive Patient Education  2017 [employer].

## 2016-07-30 ENCOUNTER — Encounter: Payer: Self-pay | Admitting: Orthopaedic Surgery

## 2016-07-30 NOTE — Progress Notes (Signed)
Subjective:    Patient ID: Carol [name], female    DOB: 1965-12-24, 50 y.o.   MRN: [id number]  HPI She has had lower back pain on and off for years.  It would only last a day or two and she would be better.  She has been to the ER on and off for treatment.  Around the first of December she began having lower back pain.  It was centralized and not radiating.  It did not go away with ice, rest, heat or rubs.  At work she does a lot of lifting.  She went to the ER on 07-22-16.  She had x-rays.  She was given Flexeril, pain medicine.  She did not improve.  She went back to the ER on 07-27-16.  She was referred here.  I have reviewed the ER records of both visits and the x-rays and x-ray reports.  She is a little better but not much.  She has no weakness, she has no paresthesias or bowel or bladder problems.  I will set up PT exercises for her and give note not to lift more than 25 pounds at work.  She smokes and is willing to cut back.  Review of Systems  HENT: Negative for congestion.   Respiratory: Negative for cough and shortness of breath.   Cardiovascular: Negative for chest pain and leg swelling.  Endocrine: Negative for cold intolerance.  Musculoskeletal: Positive for arthralgias and back pain.  Allergic/Immunologic: Negative for environmental allergies.   Past Medical History:  Diagnosis Date  . Back pain   . Bronchitis   . Cervicalgia   . History of blood in urine    microscopic  . History of migraine headaches   . Hypertension   . Impaired fasting glucose     Past Surgical History:  Procedure Laterality Date  . ABDOMINAL HYSTERECTOMY  2002  . CHOLECYSTECTOMY    . TUBAL LIGATION      Current Outpatient Prescriptions on File Prior to Visit  Medication Sig Dispense Refill  . cephALEXin (KEFLEX) 500 MG capsule Take 1 capsule (500 mg total) by mouth 4 (four) times daily. for 7 days 28 capsule 0  . cyclobenzaprine (FLEXERIL) 10 MG tablet Take 1 tablet (10 mg total) by  mouth 3 (three) times daily as needed. 21 tablet 0  . ibuprofen (ADVIL,MOTRIN) 400 MG tablet Take 1 tablet (400 mg total) by mouth every 8 (eight) hours as needed. 15 tablet 0   No current facility-administered medications on file prior to visit.     Social History   Social History  . Marital status: Single    Spouse name: N/A  . Number of children: 2  . Years of education: N/A   Occupational History  . 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 Smoking status: Current Every Day Smoker    Packs/day: 0.50    Years: 20.00    Types: Cigarettes, Cigars  . Smokeless tobacco: Never Used     Comment: Tobacco info given  . Alcohol use Yes     Comment: occ.  . Drug use: No  . Sexual activity: Yes   Other Topics Concern  . Not on file   Social History Narrative  . No narrative on file    Family History  Problem Relation Age of Onset  . Arthritis Paternal Grandmother   . Diabetes Paternal Grandmother   . Hypertension Paternal Grandmother   . Stroke Paternal Grandmother   . Diabetes  Mother   . Colon cancer Neg Hx     BP 123/90   Pulse 99   Temp 97.7 F (36.5 C)   Ht 5' 9.5" (1.765 m)   Wt 172 lb (78 kg)   BMI 25.04 kg/m      Objective:   Physical Exam  Constitutional: She is oriented to person, place, and time. She appears well-developed and well-nourished.  HENT:  Head: Normocephalic and atraumatic.  Eyes: Conjunctivae and EOM are normal. Pupils are equal, round, and reactive to light.  Neck: Normal range of motion. Neck supple.  Cardiovascular: Normal rate, regular rhythm and intact distal pulses.   Pulmonary/Chest: Effort normal.  Abdominal: Soft.  Musculoskeletal: She exhibits tenderness (Mild line lower back pain around L4.  No spasm. ROM forward 45, lateral bend normal, SLR negative, extension 10.  Gait normal.  No weakness.  ).       Lumbar back: She exhibits decreased range of motion and tenderness.       Back:  Neurological: She  is alert and oriented to person, place, and time. She displays normal reflexes. No cranial nerve deficit. She exhibits normal muscle tone. Coordination normal.  Skin: Skin is warm and dry.  Psychiatric: She has a normal mood and affect. Her behavior is normal. Judgment and thought content normal.          Assessment & Plan:   Encounter Diagnoses  Name Primary?  . Chronic midline low back pain without sciatica Yes  . Cigarette nicotine dependence without complication    Begin PT.  Rx for pain medicine and Naprosyn given.  Precautions discussed.  Return in three weeks.  Note for work given.  Call if any problem.  Electronically Signed Sanjuana Kava, MD 12/13/20177:47 AM

## 2016-08-19 ENCOUNTER — Ambulatory Visit (INDEPENDENT_AMBULATORY_CARE_PROVIDER_SITE_OTHER): Payer: BLUE CROSS/BLUE SHIELD | Admitting: Urology

## 2016-08-19 DIAGNOSIS — R311 Benign essential microscopic hematuria: Secondary | ICD-10-CM | POA: Diagnosis not present

## 2016-08-20 ENCOUNTER — Ambulatory Visit (INDEPENDENT_AMBULATORY_CARE_PROVIDER_SITE_OTHER): Payer: BLUE CROSS/BLUE SHIELD | Admitting: Orthopaedic Surgery

## 2016-08-20 ENCOUNTER — Encounter: Payer: Self-pay | Admitting: Orthopaedic Surgery

## 2016-08-20 ENCOUNTER — Ambulatory Visit (HOSPITAL_COMMUNITY): Payer: BLUE CROSS/BLUE SHIELD | Attending: Orthopaedic Surgery | Admitting: Physical Therapy

## 2016-08-20 VITALS — BP 140/85 | HR 77 | Temp 97.9°F | Ht 69.58 in | Wt 173.0 lb

## 2016-08-20 DIAGNOSIS — G8929 Other chronic pain: Secondary | ICD-10-CM

## 2016-08-20 DIAGNOSIS — F17210 Nicotine dependence, cigarettes, uncomplicated: Secondary | ICD-10-CM | POA: Diagnosis not present

## 2016-08-20 DIAGNOSIS — R29898 Other symptoms and signs involving the musculoskeletal system: Secondary | ICD-10-CM | POA: Diagnosis present

## 2016-08-20 DIAGNOSIS — M5441 Lumbago with sciatica, right side: Secondary | ICD-10-CM | POA: Diagnosis not present

## 2016-08-20 DIAGNOSIS — R293 Abnormal posture: Secondary | ICD-10-CM | POA: Diagnosis present

## 2016-08-20 DIAGNOSIS — M25652 Stiffness of left hip, not elsewhere classified: Secondary | ICD-10-CM

## 2016-08-20 DIAGNOSIS — M25651 Stiffness of right hip, not elsewhere classified: Secondary | ICD-10-CM | POA: Diagnosis present

## 2016-08-20 DIAGNOSIS — M545 Low back pain: Secondary | ICD-10-CM

## 2016-08-20 NOTE — Patient Instructions (Addendum)
Continue same work restrictions.   Steps to Quit Smoking Smoking tobacco can be bad for your health. It can also affect almost every organ in your body. Smoking puts you and people around you at risk for many serious long-lasting (chronic) diseases. Quitting smoking is hard, but it is one of the best things that you can do for your health. It is never too late to quit. What are the benefits of quitting smoking? When you quit smoking, you lower your risk for getting serious diseases and conditions. They can include:  Lung cancer or lung disease.  Heart disease.  Stroke.  Heart attack.  Not being able to have children (infertility).  Weak bones (osteoporosis) and broken bones (fractures). If you have coughing, wheezing, and shortness of breath, those symptoms may get better when you quit. You may also get sick less often. If you are pregnant, quitting smoking can help to lower your chances of having a baby of low birth weight. What can I do to help me quit smoking? Talk with your doctor about what can help you quit smoking. Some things you can do (strategies) include:  Quitting smoking totally, instead of slowly cutting back how much you smoke over a period of time.  Going to in-person counseling. You are more likely to quit if you go to many counseling sessions.  Using resources and support systems, such as:  Online chats with a Social worker.  Phone quitlines.  Printed Furniture conservator/restorer.  Support groups or group counseling.  Text messaging programs.  Mobile phone apps or applications.  Taking medicines. Some of these medicines may have nicotine in them. If you are pregnant or breastfeeding, do not take any medicines to quit smoking unless your doctor says it is okay. Talk with your doctor about counseling or other things that can help you. Talk with your doctor about using more than one strategy at the same time, such as taking medicines while you are also going to in-person  counseling. This can help make quitting easier. What things can I do to make it easier to quit? Quitting smoking might feel very hard at first, but there is a lot that you can do to make it easier. Take these steps:  Talk to your family and friends. Ask them to support and encourage you.  Call phone quitlines, reach out to support groups, or work with a Social worker.  Ask people who smoke to not smoke around you.  Avoid places that make you want (trigger) to smoke, such as:  Bars.  Parties.  Smoke-break areas at work.  Spend time with people who do not smoke.  Lower the stress in your life. Stress can make you want to smoke. Try these things to help your stress:  Getting regular exercise.  Deep-breathing exercises.  Yoga.  Meditating.  Doing a body scan. To do this, close your eyes, focus on one area of your body at a time from head to toe, and notice which parts of your body are tense. Try to relax the muscles in those areas.  Download or buy apps on your mobile phone or tablet that can help you stick to your quit plan. There are many free apps, such as QuitGuide from the [employer] Office manager for Disease Control and Prevention). You can find more support from smokefree.gov and other websites. This information is not intended to replace advice given to you by your health care provider. Make sure you discuss any questions you have with your health care provider. Document  Released: 05/31/2009 Document Revised: 04/01/2016 Document Reviewed: 12/19/2014 Elsevier Interactive Patient Education  2017 [employer].

## 2016-08-20 NOTE — Progress Notes (Signed)
Patient [id number] Carol [name], female DOB:08-Oct-1965, 51 y.o. [id number]  Chief Complaint  Patient presents with  . Follow-up    low back pain    HPI  Carol [name] is a 51 y.o. female who has lower back pain.  She has been to PT and has improved.  She has much less pain. She has no paresthesias. She is doing her exercises at home.  She is taking less pain medicine. HPI  Body mass index is 25.12 kg/m.  ROS  Review of Systems  HENT: Negative for congestion.   Respiratory: Negative for cough and shortness of breath.   Cardiovascular: Negative for chest pain and leg swelling.  Endocrine: Negative for cold intolerance.  Musculoskeletal: Positive for arthralgias and back pain.  Allergic/Immunologic: Negative for environmental allergies.    Past Medical History:  Diagnosis Date  . Back pain   . Bronchitis   . Cervicalgia   . History of blood in urine    microscopic  . History of migraine headaches   . Hypertension   . Impaired fasting glucose     Past Surgical History:  Procedure Laterality Date  . ABDOMINAL HYSTERECTOMY  2002  . CHOLECYSTECTOMY    . TUBAL LIGATION      Family History  Problem Relation Age of Onset  . Arthritis Paternal Grandmother   . Diabetes Paternal Grandmother   . Hypertension Paternal Grandmother   . Stroke Paternal Grandmother   . Diabetes Mother   . Colon cancer Neg Hx     Social History Social History  Substance Use Topics  . Smoking status: Current Every Day Smoker    Packs/day: 0.50    Years: 20.00    Types: Cigarettes, Cigars  . Smokeless tobacco: Never Used     Comment: Tobacco info given  . Alcohol use Yes     Comment: occ.    Allergies  Allergen Reactions  . Naftin [Naftifine Hcl] Other (See Comments)    Peeling of skin, discoloration    Current Outpatient Prescriptions  Medication Sig Dispense Refill  . cephALEXin (KEFLEX) 500 MG capsule Take 1 capsule (500 mg total) by mouth 4 (four) times daily. for 7 days 28  capsule 0  . cyclobenzaprine (FLEXERIL) 10 MG tablet Take 1 tablet (10 mg total) by mouth 3 (three) times daily as needed. 21 tablet 0  . HYDROcodone-acetaminophen (NORCO) 7.5-325 MG tablet Take 1 tablet by mouth every 4 (four) hours as needed for moderate pain (Must last 30 days.  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 120 tablet 0  . ibuprofen (ADVIL,MOTRIN) 400 MG tablet Take 1 tablet (400 mg total) by mouth every 8 (eight) hours as needed. 15 tablet 0  . naproxen (NAPROSYN) 500 MG tablet Take 1 tablet (500 mg total) by mouth 2 (two) times daily with a meal. 60 tablet 5   No current facility-administered medications for this visit.      Physical Exam  Blood pressure 140/85, pulse 77, temperature 97.9 F (36.6 C), height 5' 9.58" (1.767 m), weight 173 lb (78.5 kg).  Constitutional: overall normal hygiene, normal nutrition, well developed, normal grooming, normal body habitus. Assistive device:none  Musculoskeletal: gait and station Limp none, muscle tone and strength are normal, no tremors or atrophy is present.  .  Neurological: coordination overall normal.  Deep tendon reflex/nerve stretch intact.  Sensation normal.  Cranial nerves II-XII intact.   Skin:   Normal overall no scars, lesions, ulcers or rashes. No psoriasis.  Psychiatric: Alert and oriented x 3.  Recent memory intact, remote memory unclear.  Normal mood and affect. Well groomed.  Good eye contact.  Cardiovascular: overall no swelling, no varicosities, no edema bilaterally, normal temperatures of the legs and arms, no clubbing, cyanosis and good capillary refill.  Lymphatic: palpation is normal.  Spine/Pelvis examination:  Inspection:  Overall, sacoiliac joint benign and hips nontender; without crepitus or defects.   Thoracic spine inspection: Alignment normal without kyphosis present   Lumbar spine inspection:  Alignment  with normal lumbar lordosis, without scoliosis  apparent.   Thoracic spine palpation:  without tenderness of spinal processes   Lumbar spine palpation: with tenderness of lumbar area; without tightness of lumbar muscles    Range of Motion:   Lumbar flexion, forward flexion is full without pain or tenderness    Lumbar extension is full without pain or tenderness   Left lateral bend is Normal  without pain or tenderness   Right lateral bend is Normal without pain or tenderness   Straight leg raising is Normal   Strength & tone: Normal   Stability overall normal stability     The patient has been educated about the nature of the problem(s) and counseled on treatment options.  The patient appeared to understand what I have discussed and is in agreement with it.  Encounter Diagnoses  Name Primary?  . Chronic midline low back pain without sciatica Yes  . Cigarette nicotine dependence without complication     PLAN Call if any problems.  Precautions discussed.  Continue current medications.   Return to clinic 3 weeks  Continue PT   Electronically Signed Sanjuana Kava, MD 1/3/20183:39 PM

## 2016-08-20 NOTE — Therapy (Signed)
[place], [place], [address] Phone: [phone]   Fax:  [phone]  Physical Therapy Evaluation  Patient Details  Name: Carol [name] MRN: [id number] Date of Birth: May 21, 1966 Referring Provider: Sanjuana Kava  Encounter Date: 08/20/2016      PT End of Session - 08/20/16 0911    Visit Number 1   Number of Visits 12   Date for PT Re-Evaluation 09/16/16   Authorization Type [employer] Other (19 visits left including this eval)   Authorization Time Period 08/20/16 to 10/01/16   Authorization - Visit Number 1   Authorization - Number of Visits 19   PT Start Time 0825  patient arrived late/had to check in    PT Stop Time 0859   PT Time Calculation (min) 34 min   Activity Tolerance Patient tolerated treatment well   Behavior During Therapy [facility] for tasks assessed/performed      Past Medical History:  Diagnosis Date  . Back pain   . Bronchitis   . Cervicalgia   . History of blood in urine    microscopic  . History of migraine headaches   . Hypertension   . Impaired fasting glucose     Past Surgical History:  Procedure Laterality Date  . ABDOMINAL HYSTERECTOMY  2002  . CHOLECYSTECTOMY    . TUBAL LIGATION      There were no vitals filed for this visit.       Subjective Assessment - 08/20/16 0828    Subjective In December patient thinks maybe because she moved wrong, or the bed was too soft, she began to have low back pain. She started take Alieve but that next Tuesday she woke up and couldn't move and went to [facility] who said UTI; she then went to hospital due to worsening back pain a couple days later, where she was told her discs are deteriorating and that she needed surgery. Dr. Luna Glasgow however has told her he disagrees with degenerative disc disorder diagnosis and has sent her to PT instead. Bending over makes her pain feel worse, as does squats and just trying to get down to floor. Laying on her side makes  her feel better. She reports some numbness/tingling but does not really pay much attention to it; it is in the back of her leg when it happens. No incontinence/acute bowel bladder changes noted. NO falls recently.    Pertinent History no significant PMH/PSH   How long can you sit comfortably? 10-15 minutes    How long can you stand comfortably? not sure, OK if she can prop one foot up    How long can you walk comfortably? not sure    Patient Stated Goals get back to PLOF    Currently in Pain? Yes   Pain Score 8    Pain Location Back   Pain Orientation Lower   Pain Descriptors / Indicators Throbbing;Other (Comment)  stiffness    Pain Type Chronic pain   Pain Radiating Towards down back of R LE    Pain Onset 1 to 4 weeks ago   Pain Frequency Constant   Aggravating Factors  bending over, squatting   Pain Relieving Factors laying on her side    Effect of Pain on Daily Activities cannot perform PLOF based tasks             [facility] PT Assessment - 08/20/16 0001      Assessment   Medical Diagnosis LBP   Referring Provider  Sanjuana Kava   Onset Date/Surgical Date --  December 2017   Next MD Visit Dr. Luna Glasgow today      Precautions   Precaution Comments MD says not to bend, do not lift more than 25#   per patient report- MD note only notes lifting restrictions      Balance Screen   Has the patient fallen in the past 6 months No   Has the patient had a decrease in activity level because of a fear of falling?  No   Is the patient reluctant to leave their home because of a fear of falling?  No     Prior Function   Level of Independence Independent;Independent with basic ADLs;Independent with gait;Independent with transfers     Observation/Other Assessments   Observations FABER, scour, SLR all negative B      AROM   Lumbar Flexion severe limitation    Lumbar Extension severe limitation    Lumbar - Right Side Bend severe limitation    Lumbar - Left Side Bend severe limitation     Lumbar - Right Rotation moderate limitation    Lumbar - Left Rotation moderate limitation     Strength   Right Hip Flexion 5/5   Right Hip Extension 3-/5   Right Hip ABduction 4+/5   Left Hip Flexion 5/5   Left Hip Extension 3-/5   Left Hip ABduction 4+/5   Right Knee Flexion 4+/5   Right Knee Extension 4+/5   Left Knee Flexion 4+/5   Left Knee Extension 4+/5   Right Ankle Dorsiflexion 5/5   Left Ankle Dorsiflexion 5/5     Flexibility   Hamstrings WFL    Piriformis severe limiation B      Palpation   Spinal mobility grossly hypomobile lumbar and sacral PAs    Palpation comment spasm of paraspinals noted B                            PT Education - 08/20/16 0910    Education provided Yes   Education Details prognosis, HEP, POC; may have some discomfort initially, stretching sensation is OK; watch for increased radicular symptoms with HEP, stop exercises if radicular symptoms increase   Person(s) Educated Patient   Methods Explanation;Demonstration;Handout   Comprehension Verbalized understanding;Returned demonstration;Need further instruction          PT Short Term Goals - 08/20/16 0917      PT SHORT TERM GOAL #1   Title Patient to demonstrate only moderate (50-60%) limitation in lumbar ROM on all planes in order to reduce pain and improve overall mechanics    Time 3   Period Weeks   Status New     PT SHORT TERM GOAL #2   Title Patient to demonstrate only moderate (50-60%) limitation in bilateral hip ROM on all planes in order to reduce pain and improve overall mechanics    Time 3   Period Weeks   Status New     PT SHORT TERM GOAL #3   Title Patient to be able to maintain correct posture at least 75% of the time in order to correct mechanics and improve functional task performance    Time 3   Period Weeks   Status New     PT SHORT TERM GOAL #4   Title Patient to be independent in correctly and consistently performing appropriate HEP, to be  updated PRN    Time 1   Period Weeks  Status New           PT Long Term Goals - 08/20/16 0919      PT LONG TERM GOAL #1   Title Patient to demonstrate lumbar and bilateral hip ROM as being only mildly (30% or less) limited on all planes in order to improve mechanics and reduce pain    Time 6   Period Weeks   Status New     PT LONG TERM GOAL #2   Title Patient to demonstrate functional strength 5/5 in all tested muscle groups in order to improve functional task tolerance and performance    Time 6   Period Weeks   Status New     PT LONG TERM GOAL #3   Title Patient to report she has been able to return to all PLOF based activities with pain no more than 3/10 in order to improve QOL    Time 6   Period Weeks   Status New     PT LONG TERM GOAL #4   Title Patient to be independent in appropriate regular exercise program, at least 20-30 minutes in duration and at least 3 days per week, in order to maintain functional gains and improve general health status    Time 6   Period Weeks   Status New               Plan - 08/20/16 0912    Clinical Impression Statement Patient arrives with low back pain that started in December of 2017, she is not sure exactly what started it; per her report she was told by her MD not to lift more than 25# and to avoid bending at this time, however MD's most recent note only indicates lifting restriction.  She states that her pain is localized to her low back and tends to have a stiff, throbbing sensation. Review of recent imaging shows no major acute spinal abnormalities that would cause reason to avoid certain motions with the spine. Examination reveals severe lumbar and bilateral hip stiffness, postural deviations, mild functional strength deficits, and reduced ability to perform PLOF based activities. Suspect that pain is related to hypomobilty/reduced flexibility and associated altered mechanics at this time. Patient able to tolerate HEP well this  session, reporting improvement of stiffness in low back with performance of HEP tasks. Patient will benefit from skilled PT services in order to address functional deficits and assist in reaching optimal level of function.    Rehab Potential Good   Clinical Impairments Affecting Rehab Potential (+) high PLOF, appears motivated to participate with skilled PT services; (-) active smoker, physical job with repetitive motions    PT Frequency 2x / week   PT Duration 6 weeks   PT Treatment/Interventions ADLs/Self Care Home Management;Biofeedback;Moist Heat;Gait training;Stair training;Functional mobility training;Therapeutic activities;Therapeutic exercise;Balance training;Neuromuscular re-education;Patient/family education;Manual techniques;Passive range of motion;Energy conservation;Taping   PT Next Visit Plan review HEP and goals; no further measurements/evaluation needed at this time. Focus on lumbar and bilateral hip mobility and flexibility; consider hip seatbelt mobilizations bilaterally as well. Manual to B lumbar paraspinals.     PT Home Exercise Plan Eval: SKTC, lumbar rotations, 3D hip excursions    Consulted and Agree with Plan of Care Patient      Patient will benefit from skilled therapeutic intervention in order to improve the following deficits and impairments:  Abnormal gait, Increased fascial restricitons, Improper body mechanics, Pain, Decreased mobility, Decreased coordination, Increased muscle spasms, Postural dysfunction, Decreased strength, Hypomobility, Difficulty walking, Impaired flexibility  Visit Diagnosis: Chronic midline low back pain with right-sided sciatica - Plan: PT plan of care cert/re-cert  Stiffness of left hip, not elsewhere classified - Plan: PT plan of care cert/re-cert  Stiffness of right hip, not elsewhere classified - Plan: PT plan of care cert/re-cert  Abnormal posture - Plan: PT plan of care cert/re-cert  Other symptoms and signs involving the  musculoskeletal system - Plan: PT plan of care cert/re-cert     Problem List Patient Active Problem List   Diagnosis Date Noted  . Pes planus, flexible 10/20/2013    Deniece Ree PT, DPT [place] [address] [place], [place], [address] Phone: [phone]   Fax:  [phone]  Name: Carol [name] MRN: [id number] Date of Birth: 02-08-1966

## 2016-08-20 NOTE — Patient Instructions (Signed)
   SINGLE KNEE TO CHEST STRETCH - [place]  While Lying on your back, hold your knee and gently pull it up towards your chest.  Hold for 10 seconds and repeat 5 times each side, twice a day.     Lumbar Rotations   Lying on your back with your knees bent, slowly drop your legs to one side and hold the stretch. Come back to the middle and switch sides. You should feel the stretch in your back on the opposite side that your legs are leaning.   Hold this position for 10 seconds, repeat 5 times each side twice a day.    3D HIP EXCURSIONS (SEE SEPARATE HANDOUT)  Repeat 10 times each way, twice a day.

## 2016-08-22 ENCOUNTER — Ambulatory Visit (HOSPITAL_COMMUNITY): Payer: BLUE CROSS/BLUE SHIELD

## 2016-08-22 DIAGNOSIS — M5441 Lumbago with sciatica, right side: Principal | ICD-10-CM

## 2016-08-22 DIAGNOSIS — R293 Abnormal posture: Secondary | ICD-10-CM

## 2016-08-22 DIAGNOSIS — R29898 Other symptoms and signs involving the musculoskeletal system: Secondary | ICD-10-CM

## 2016-08-22 DIAGNOSIS — M25652 Stiffness of left hip, not elsewhere classified: Secondary | ICD-10-CM

## 2016-08-22 DIAGNOSIS — G8929 Other chronic pain: Secondary | ICD-10-CM

## 2016-08-22 DIAGNOSIS — M25651 Stiffness of right hip, not elsewhere classified: Secondary | ICD-10-CM

## 2016-08-22 NOTE — Therapy (Signed)
[place], [place], [address] Phone: [phone]   Fax:  [phone]  Physical Therapy Treatment  Patient Details  Name: Carol [name] MRN: [id number] Date of Birth: 17-Jan-1966 Referring Provider: Sanjuana Kava  Encounter Date: 08/22/2016      PT End of Session - 08/22/16 1533    Visit Number 2   Number of Visits 12   Date for PT Re-Evaluation 09/16/16   Authorization Type [employer] Other (19 visits left including this eval)   Authorization Time Period 08/20/16 to 10/01/16   Authorization - Visit Number 2   Authorization - Number of Visits 19   PT Start Time 1520   PT Stop Time 1604   PT Time Calculation (min) 44 min   Activity Tolerance Patient tolerated treatment well   Behavior During Therapy [facility] for tasks assessed/performed      Past Medical History:  Diagnosis Date  . Back pain   . Bronchitis   . Cervicalgia   . History of blood in urine    microscopic  . History of migraine headaches   . Hypertension   . Impaired fasting glucose     Past Surgical History:  Procedure Laterality Date  . ABDOMINAL HYSTERECTOMY  2002  . CHOLECYSTECTOMY    . TUBAL LIGATION      There were no vitals filed for this visit.      Subjective Assessment - 08/22/16 1525    Subjective Pt stated her lower back is stiff today, pain scale 7/10.  Reports complaince with HEP   Pertinent History no significant PMH/PSH   Patient Stated Goals get back to PLOF    Currently in Pain? Yes   Pain Score 7    Pain Location Back   Pain Orientation Lower   Pain Descriptors / Indicators Tightness;Aching  Stiffness   Pain Type Chronic pain   Pain Radiating Towards reports radicular symptoms   Pain Onset 1 to 4 weeks ago   Pain Frequency Constant   Aggravating Factors  bending over, squatting   Pain Relieving Factors laying on her side   Effect of Pain on Daily Activities cannot perform PLOF based tasks                          [facility] Adult PT Treatment/Exercise - 08/22/16 0001      Exercises   Exercises Lumbar     Lumbar Exercises: Stretches   Single Knee to Chest Stretch 3 reps;10 seconds   Lower Trunk Rotation 10 seconds   Lower Trunk Rotation Limitations 10x 10"   Quadruped Mid Back Stretch 1 rep;30 seconds   Quadruped Mid Back Stretch Limitations child's pose     Lumbar Exercises: Standing   Other Standing Lumbar Exercises 3D hip excursion     Lumbar Exercises: Seated   Other Seated Lumbar Exercises Instructed towel lower back for posture and pain relief     Lumbar Exercises: Supine   Ab Set 5 reps;3 seconds   AB Set Limitations multimodal cueing for form; to improve awareness of posture alignment; continue focus on mobility not strengthenig right now   Other Supine Lumbar Exercises Reviewed body mechanics for getting in/out of bed     Manual Therapy   Manual Therapy Soft tissue mobilization   Manual therapy comments Manual complete separate rest of tx   Soft tissue mobilization prone position with pillow under hips and bolster under ankles; STM to lumbar paraspinals  PT Education - 08/22/16 1533    Education provided Yes   Education Details Reviewed goals, compliance and reviewed form with HEP, copy of eval given to pt.  Educated pt on lumbar roll for sitting long periods of time to assist with pain and posture, educated proper bed mobilty   Person(s) Educated Patient   Methods Explanation;Demonstration;Handout   Comprehension Verbalized understanding;Returned demonstration;Need further instruction;Verbal cues required          PT Short Term Goals - 08/20/16 0917      PT SHORT TERM GOAL #1   Title Patient to demonstrate only moderate (50-60%) limitation in lumbar ROM on all planes in order to reduce pain and improve overall mechanics    Time 3   Period Weeks   Status New     PT SHORT TERM GOAL #2   Title Patient to  demonstrate only moderate (50-60%) limitation in bilateral hip ROM on all planes in order to reduce pain and improve overall mechanics    Time 3   Period Weeks   Status New     PT SHORT TERM GOAL #3   Title Patient to be able to maintain correct posture at least 75% of the time in order to correct mechanics and improve functional task performance    Time 3   Period Weeks   Status New     PT SHORT TERM GOAL #4   Title Patient to be independent in correctly and consistently performing appropriate HEP, to be updated PRN    Time 1   Period Weeks   Status New           PT Long Term Goals - 08/20/16 0919      PT LONG TERM GOAL #1   Title Patient to demonstrate lumbar and bilateral hip ROM as being only mildly (30% or less) limited on all planes in order to improve mechanics and reduce pain    Time 6   Period Weeks   Status New     PT LONG TERM GOAL #2   Title Patient to demonstrate functional strength 5/5 in all tested muscle groups in order to improve functional task tolerance and performance    Time 6   Period Weeks   Status New     PT LONG TERM GOAL #3   Title Patient to report she has been able to return to all PLOF based activities with pain no more than 3/10 in order to improve QOL    Time 6   Period Weeks   Status New     PT LONG TERM GOAL #4   Title Patient to be independent in appropriate regular exercise program, at least 20-30 minutes in duration and at least 3 days per week, in order to maintain functional gains and improve general health status    Time 6   Period Weeks   Status New               Plan - 08/22/16 1614    Clinical Impression Statement Reviewed goals, assessed form and reviewed compliance with HEP, copy of eval given to pt.  Began session with education for proper bed mechanics, importance of posture for pain control as well as towel roll at base of spine to improve tolerance with sitting.  Session focus on improving spinal mobility and  manual technqiues to assist with pain control.  Noted moderate tightness on lumbar paraspinals.  Soft tissue massage complete to reduce spasms/tightness with reports of pain reduced to  4/10 at EOS.     Rehab Potential Good   Clinical Impairments Affecting Rehab Potential (+) high PLOF, appears motivated to participate with skilled PT services; (-) active smoker, physical job with repetitive motions    PT Frequency 2x / week   PT Duration 6 weeks   PT Treatment/Interventions ADLs/Self Care Home Management;Biofeedback;Moist Heat;Gait training;Stair training;Functional mobility training;Therapeutic activities;Therapeutic exercise;Balance training;Neuromuscular re-education;Patient/family education;Manual techniques;Passive range of motion;Energy conservation;Taping   PT Next Visit Plan Next session focus on lumbar and Bil hip mobiltiyi and flexibilty.  Manual Bil lumbar paraspinals.  Consider hip seatbelt mobilization bilaterally as well.    PT Home Exercise Plan Eval: SKTC, lumbar rotations, 3D hip excursions       Patient will benefit from skilled therapeutic intervention in order to improve the following deficits and impairments:  Abnormal gait, Increased fascial restricitons, Improper body mechanics, Pain, Decreased mobility, Decreased coordination, Increased muscle spasms, Postural dysfunction, Decreased strength, Hypomobility, Difficulty walking, Impaired flexibility  Visit Diagnosis: Chronic midline low back pain with right-sided sciatica  Stiffness of left hip, not elsewhere classified  Stiffness of right hip, not elsewhere classified  Abnormal posture  Other symptoms and signs involving the musculoskeletal system     Problem List Patient Active Problem List   Diagnosis Date Noted  . Pes planus, flexible 10/20/2013   Ihor Austin, LPTA; CBIS [phone]  Aldona Lento 08/22/2016, 6:06 PM  [place], [place], [address] Phone: [phone]   Fax:  [phone]  Name: Carol [name] MRN: [id number] Date of Birth: 12-12-1965

## 2016-08-24 ENCOUNTER — Encounter (HOSPITAL_COMMUNITY): Payer: Self-pay

## 2016-08-24 ENCOUNTER — Emergency Department (HOSPITAL_COMMUNITY)
Admission: EM | Admit: 2016-08-24 | Discharge: 2016-08-24 | Disposition: A | Discharge status: Home / Self Care | Payer: BLUE CROSS/BLUE SHIELD | Attending: Emergency Medicine | Admitting: Emergency Medicine

## 2016-08-24 DIAGNOSIS — F17210 Nicotine dependence, cigarettes, uncomplicated: Secondary | ICD-10-CM | POA: Insufficient documentation

## 2016-08-24 DIAGNOSIS — I10 Essential (primary) hypertension: Secondary | ICD-10-CM | POA: Insufficient documentation

## 2016-08-24 DIAGNOSIS — F17290 Nicotine dependence, other tobacco product, uncomplicated: Secondary | ICD-10-CM | POA: Insufficient documentation

## 2016-08-24 DIAGNOSIS — Z79899 Other long term (current) drug therapy: Secondary | ICD-10-CM | POA: Insufficient documentation

## 2016-08-24 DIAGNOSIS — R319 Hematuria, unspecified: Secondary | ICD-10-CM

## 2016-08-24 DIAGNOSIS — M5441 Lumbago with sciatica, right side: Secondary | ICD-10-CM | POA: Insufficient documentation

## 2016-08-24 LAB — URINALYSIS, ROUTINE W REFLEX MICROSCOPIC
BACTERIA UA: NONE SEEN
Bilirubin Urine: NEGATIVE
GLUCOSE, UA: NEGATIVE mg/dL
KETONES UR: NEGATIVE mg/dL
NITRITE: NEGATIVE
PROTEIN: NEGATIVE mg/dL
Specific Gravity, Urine: 1.014 (ref 1.005–1.030)
pH: 7 (ref 5.0–8.0)

## 2016-08-24 MED ORDER — METHOCARBAMOL 500 MG PO TABS
500.0000 mg | ORAL_TABLET | Freq: Once | ORAL | Status: AC
  Administered 2016-08-24: 500 mg via ORAL
  Filled 2016-08-24: qty 1

## 2016-08-24 MED ORDER — KETOROLAC TROMETHAMINE 60 MG/2ML IM SOLN
60.0000 mg | Freq: Once | INTRAMUSCULAR | Status: AC
  Administered 2016-08-24: 60 mg via INTRAMUSCULAR
  Filled 2016-08-24: qty 2

## 2016-08-24 MED ORDER — SULFAMETHOXAZOLE-TRIMETHOPRIM 800-160 MG PO TABS: 1.0000 | ORAL_TABLET | Freq: Two times a day (BID) | ORAL | 0 refills | Status: AC

## 2016-08-24 MED ORDER — HYDROMORPHONE HCL 1 MG/ML IJ SOLN
1.0000 mg | Freq: Once | INTRAMUSCULAR | Status: AC
  Administered 2016-08-24: 1 mg via INTRAMUSCULAR
  Filled 2016-08-24: qty 1

## 2016-08-24 MED ORDER — KETOROLAC TROMETHAMINE 10 MG PO TABS: 10.0000 mg | ORAL_TABLET | Freq: Three times a day (TID) | ORAL | 0 refills | Status: DC | PRN

## 2016-08-24 MED ORDER — METHOCARBAMOL 500 MG PO TABS: 500.0000 mg | ORAL_TABLET | Freq: Three times a day (TID) | ORAL | 0 refills | Status: DC

## 2016-08-24 NOTE — Discharge Instructions (Signed)
As discussed, stop the Keflex and start the Bactrim tomorrow.  Drink plenty of water and take the antibiotic as directed until its finished.  Take pepcid as directed while taking the toradol tablets.  Follow-up with your primary doctor for recheck.  Return here for any worsening symptoms

## 2016-08-24 NOTE — ED Triage Notes (Signed)
I started having back spasms today and took a hydrocodone 7.5-325 around 4 pm without relief.  History of ongoing back problems.

## 2016-08-26 ENCOUNTER — Ambulatory Visit (HOSPITAL_COMMUNITY): Payer: BLUE CROSS/BLUE SHIELD | Admitting: Physical Therapy

## 2016-08-26 NOTE — ED Provider Notes (Signed)
[facility] DEPT Provider Note   CSN: [id number] Arrival date & time: 08/24/16  2019     History   Chief Complaint Chief Complaint  Patient presents with  . Back Pain    HPI Carol [name] is a 51 y.o. female.  HPI   Carol [name] is a 51 y.o. female with hx of low back pain.  who presents to the Emergency Department complaining of low back pain and spasms. She has been seeing Dr. Luna Glasgow and taking PT for her low back pain.  She states her pain became worse earlier.  Pain is worse with movement and she describes a tightening to her back. She denies recent injury, urine or bowel changes, numbness, pain or weakness of the lower extremities, fever, and abd pain.  She has taken hydrocodone prescribed by her orthopedic provider, but states it has not helped.          Past Medical History:  Diagnosis Date  . Back pain   . Bronchitis   . Cervicalgia   . History of blood in urine    microscopic  . History of migraine headaches   . Hypertension   . Impaired fasting glucose     Patient Active Problem List   Diagnosis Date Noted  . 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 ABDOMINAL HYSTERECTOMY  2002  . CHOLECYSTECTOMY    . TUBAL LIGATION      OB History    No data available       Home Medications    Prior to Admission medications   Medication Sig Start Date End Date Taking? Authorizing Provider  cyclobenzaprine (FLEXERIL) 10 MG tablet Take 1 tablet (10 mg total) by mouth 3 (three) times daily as needed. 07/22/16   Chantalle Defilippo, PA-C  HYDROcodone-acetaminophen (NORCO) 7.5-325 MG tablet Take 1 tablet by mouth every 4 (four) hours as needed for moderate pain (Must last 30 days.  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 07/29/16   Sanjuana Kava, MD  ketorolac (TORADOL) 10 MG tablet Take 1 tablet (10 mg total) by mouth every 8 (eight) hours as needed. Take with food 08/24/16   Yadhira Mckneely, PA-C    methocarbamol (ROBAXIN) 500 MG tablet Take 1 tablet (500 mg total) by mouth 3 (three) times daily. 08/24/16   Mahari Vankirk, PA-C  sulfamethoxazole-trimethoprim (BACTRIM DS,SEPTRA DS) 800-160 MG tablet Take 1 tablet by mouth 2 (two) times daily. 08/24/16 08/31/16  Ricka Westra, PA-C    Family History Family History  Problem Relation Age of Onset  . Arthritis Paternal Grandmother   . Diabetes Paternal Grandmother   . Hypertension Paternal Grandmother   . Stroke Paternal Grandmother   . Diabetes Mother   . Colon cancer Neg Hx     Social History Social History  Substance Use Topics  . Smoking status: Current Every Day Smoker    Packs/day: 0.50    Years: 20.00    Types: Cigarettes, Cigars  . Smokeless tobacco: Never Used     Comment: Tobacco info given  . Alcohol use Yes     Comment: occ.     Allergies   Naftin [naftifine hcl]   Review of Systems Review of Systems  Constitutional: Negative for fever.  Respiratory: Negative for shortness of breath.   Gastrointestinal: Negative for abdominal pain, constipation and vomiting.  Genitourinary: Negative for decreased urine volume, difficulty urinating, dysuria, flank pain and hematuria.  Musculoskeletal:  Positive for back pain. Negative for joint swelling.  Skin: Negative for rash.  Neurological: Negative for weakness and numbness.  All other systems reviewed and are negative.    Physical Exam Updated Vital Signs BP 107/62 (BP Location: Left Arm)   Pulse 102   Temp 97.4 F (36.3 C) (Oral)   Resp 20   Ht 5' 7.5" (1.715 m)   Wt 79.8 kg   SpO2 99%   BMI 27.16 kg/m   Physical Exam  Constitutional: She is oriented to person, place, and time. She appears well-developed and well-nourished. No distress.  HENT:  Head: Normocephalic and atraumatic.  Neck: Normal range of motion. Neck supple.  Cardiovascular: Normal rate, regular rhythm and intact distal pulses.   No murmur heard. Pulmonary/Chest: Effort normal and breath  sounds normal. No respiratory distress.  Abdominal: Soft. She exhibits no distension. There is no tenderness.  Musculoskeletal: She exhibits tenderness. She exhibits no edema.       Lumbar back: She exhibits tenderness and pain. She exhibits normal range of motion, no swelling, no deformity, no laceration and normal pulse.  ttp of the lower right  lumbar paraspinal muscles.  No spinal tenderness.  DP pulses are brisk and symmetrical.  Distal sensation intact.  Pt has 5/5 strength against resistance of bilateral lower extremities.     Neurological: She is alert and oriented to person, place, and time. She has normal strength. No sensory deficit. She exhibits normal muscle tone. Coordination and gait normal.  Reflex Scores:      Patellar reflexes are 2+ on the right side and 2+ on the left side.      Achilles reflexes are 2+ on the right side and 2+ on the left side. Skin: Skin is warm and dry. No rash noted.  Nursing note and vitals reviewed.    ED Treatments / Results  Labs (all labs ordered are listed, but only abnormal results are displayed) Labs Reviewed  URINALYSIS, ROUTINE W REFLEX MICROSCOPIC - Abnormal; Notable for the following:       Result Value   APPearance HAZY (*)    Hgb urine dipstick MODERATE (*)    Leukocytes, UA TRACE (*)    All other components within normal limits  URINE CULTURE    EKG  EKG Interpretation None       Radiology No results found.  Procedures Procedures (including critical care time)  Medications Ordered in ED Medications  HYDROmorphone (DILAUDID) injection 1 mg (1 mg Intramuscular Given 08/24/16 2122)  ketorolac (TORADOL) injection 60 mg (60 mg Intramuscular Given 08/24/16 2122)  methocarbamol (ROBAXIN) tablet 500 mg (500 mg Oral Given 08/24/16 2122)     Initial Impression / Assessment and Plan / ED Course  I have reviewed the triage vital signs and the nursing notes.  Pertinent labs & imaging results that were available during my care of  the patient were reviewed by me and considered in my medical decision making (see chart for details).  Clinical Course     Pt with likely acute on chronic low back pain.  NV intact.  No focal neuro deficits, ambulates with a steady gait.    Pt seen here last month for hematuria.  She did not take the antibiotic as directed.  Continues to have sx's.  Urine cultured again.  Will have her d/c the keflex, rx written for bactrim.    Pain improved after medication.  Appears stable for d/c and agrees to close f/u with her orthopedic physician.  Final Clinical Impressions(s) / ED Diagnoses   Final diagnoses:  Right-sided low back pain with right-sided sciatica, unspecified chronicity  Hematuria, unspecified type    New Prescriptions Discharge Medication List as of 08/24/2016 10:31 PM    START taking these medications   Details  ketorolac (TORADOL) 10 MG tablet Take 1 tablet (10 mg total) by mouth every 8 (eight) hours as needed. Take with food, Starting Sun 08/24/2016, Print    methocarbamol (ROBAXIN) 500 MG tablet Take 1 tablet (500 mg total) by mouth 3 (three) times daily., Starting Sun 08/24/2016, Print    sulfamethoxazole-trimethoprim (BACTRIM DS,SEPTRA DS) 800-160 MG tablet Take 1 tablet by mouth 2 (two) times daily., Starting Sun 08/24/2016, Until Sun 08/31/2016, [place], PA-C 08/26/16 [place], MD 08/29/16 [phone]

## 2016-08-27 LAB — URINE CULTURE

## 2016-08-28 ENCOUNTER — Ambulatory Visit (HOSPITAL_COMMUNITY): Payer: BLUE CROSS/BLUE SHIELD

## 2016-08-28 ENCOUNTER — Telehealth (HOSPITAL_BASED_OUTPATIENT_CLINIC_OR_DEPARTMENT_OTHER): Payer: Self-pay

## 2016-08-28 DIAGNOSIS — M5441 Lumbago with sciatica, right side: Principal | ICD-10-CM

## 2016-08-28 DIAGNOSIS — G8929 Other chronic pain: Secondary | ICD-10-CM

## 2016-08-28 DIAGNOSIS — R29898 Other symptoms and signs involving the musculoskeletal system: Secondary | ICD-10-CM

## 2016-08-28 DIAGNOSIS — M25651 Stiffness of right hip, not elsewhere classified: Secondary | ICD-10-CM

## 2016-08-28 DIAGNOSIS — R293 Abnormal posture: Secondary | ICD-10-CM

## 2016-08-28 DIAGNOSIS — M25652 Stiffness of left hip, not elsewhere classified: Secondary | ICD-10-CM

## 2016-08-28 NOTE — Patient Instructions (Addendum)
  Hamstring Stretch    With other leg bent, foot flat, grasp right leg and slowly try to straighten knee. Hold 30 seconds. Repeat 3 times. May use belt or dog lease attached to foot to assist with stretch.    http://gt2.exer.us/280   Copyright  VHI. All rights reserved.   BACK: Child's Pose (Sciatica)    Sit in knee-chest position and reach arms forward. Separate knees for comfort. Hold position for 30 breaths. Repeat 3 times.  Copyright  VHI. All rights reserved.

## 2016-08-28 NOTE — Therapy (Signed)
[place], [place], [address] Phone: [phone]   Fax:  [phone]  Physical Therapy Treatment  Patient Details  Name: Carol [name] MRN: [id number] Date of Birth: 1965/12/05 Referring Provider: Sanjuana Kava  Encounter Date: 08/28/2016      PT End of Session - 08/28/16 1502    Visit Number 3   Number of Visits 12   Date for PT Re-Evaluation 09/16/16   Authorization Type BC[employer] Other (19 visits left including this eval)   Authorization Time Period 08/20/16 to 10/01/16   Authorization - Visit Number 3   Authorization - Number of Visits 10   PT Start Time [id number]   PT Stop Time 1516   PT Time Calculation (min) 38 min   Activity Tolerance Patient tolerated treatment well   Behavior During Therapy [facility] for tasks assessed/performed      Past Medical History:  Diagnosis Date  . Back pain   . Bronchitis   . Cervicalgia   . History of blood in urine    microscopic  . History of migraine headaches   . Hypertension   . Impaired fasting glucose     Past Surgical History:  Procedure Laterality Date  . ABDOMINAL HYSTERECTOMY  2002  . CHOLECYSTECTOMY    . TUBAL LIGATION      There were no vitals filed for this visit.      Subjective Assessment - 08/28/16 1436    Subjective Pt went to ER Sunday due to lower back spasms, received shot that assisted with pain.  Pt stated she has no lower back pain at entrance though does report radicular symptoms down Lt LE.  Pt reports hospital told her to hold off work, reports she lost her job for not calling/going into work.   Pertinent History no significant PMH/PSH   Patient Stated Goals get back to PLOF    Currently in Pain? No/denies  6/10 Lt LE lateral proximal and anterior distal   Pain Score 6    Pain Location Leg   Pain Orientation Lateral;Proximal;Anterior;Distal   Pain Descriptors / Indicators --  stinging with shoes off   Pain Type Chronic pain   Pain Radiating  Towards reports radicular symptoms down Lt LE   Pain Onset 1 to 4 weeks ago   Pain Frequency Constant   Aggravating Factors  bending over, squatting   Pain Relieving Factors laying on her side   Effect of Pain on Daily Activities cannot perform PLOF based tasks              [facility] Adult PT Treatment/Exercise - 08/28/16 0001      Lumbar Exercises: Stretches   Active Hamstring Stretch 1 rep;30 seconds;2 reps   Active Hamstring Stretch Limitations 1st rep with arms behind knee;   Single Knee to Chest Stretch 3 reps;20 seconds   Double Knee to Chest Stretch 2 reps;30 seconds   Double Knee to Chest Stretch Limitations 1 rep static; 2nd rep with rotation    Lower Trunk Rotation 10 seconds   Lower Trunk Rotation Limitations 10x 10"     Lumbar Exercises: Seated   Other Seated Lumbar Exercises 3D Thoracic excursion for mobility with arms crossed on chest 5x each direction     Manual Therapy   Soft tissue mobilization hold manual this session per pt request                PT Education - 08/28/16 1828    Education provided  Yes   Education Details Reviewed importance of proper bed mobility   Person(s) Educated Patient   Methods Explanation;Demonstration   Comprehension Verbalized understanding;Returned demonstration;Need further instruction          PT Short Term Goals - 08/20/16 0917      PT SHORT TERM GOAL #1   Title Patient to demonstrate only moderate (50-60%) limitation in lumbar ROM on all planes in order to reduce pain and improve overall mechanics    Time 3   Period Weeks   Status New     PT SHORT TERM GOAL #2   Title Patient to demonstrate only moderate (50-60%) limitation in bilateral hip ROM on all planes in order to reduce pain and improve overall mechanics    Time 3   Period Weeks   Status New     PT SHORT TERM GOAL #3   Title Patient to be able to maintain correct posture at least 75% of the time in order to correct mechanics and improve functional  task performance    Time 3   Period Weeks   Status New     PT SHORT TERM GOAL #4   Title Patient to be independent in correctly and consistently performing appropriate HEP, to be updated PRN    Time 1   Period Weeks   Status New           PT Long Term Goals - 08/20/16 0919      PT LONG TERM GOAL #1   Title Patient to demonstrate lumbar and bilateral hip ROM as being only mildly (30% or less) limited on all planes in order to improve mechanics and reduce pain    Time 6   Period Weeks   Status New     PT LONG TERM GOAL #2   Title Patient to demonstrate functional strength 5/5 in all tested muscle groups in order to improve functional task tolerance and performance    Time 6   Period Weeks   Status New     PT LONG TERM GOAL #3   Title Patient to report she has been able to return to all PLOF based activities with pain no more than 3/10 in order to improve QOL    Time 6   Period Weeks   Status New     PT LONG TERM GOAL #4   Title Patient to be independent in appropriate regular exercise program, at least 20-30 minutes in duration and at least 3 days per week, in order to maintain functional gains and improve general health status    Time 6   Period Weeks   Status New               Plan - 08/28/16 1824    Clinical Impression Statement Pt reports she went to ER Sunday following spasm locking her up while driving.  Session focus on improving spinal mobility to assist with pain control.  Offered manual soft tissue massage to address tightness, pt requested hold due to attire she had on (wearing skirt and tightes to session)..  Added thoracic excursion to improve spinal mobility.  EOS pt reports no pain or radicular symptoms.  Encouraged to increase frequency for maximal benefits with HEP.   Rehab Potential Good   Clinical Impairments Affecting Rehab Potential (+) high PLOF, appears motivated to participate with skilled PT services; (-) active smoker, physical job with  repetitive motions    PT Frequency 2x / week   PT Duration 6 weeks  PT Treatment/Interventions ADLs/Self Care Home Management;Biofeedback;Moist Heat;Gait training;Stair training;Functional mobility training;Therapeutic activities;Therapeutic exercise;Balance training;Neuromuscular re-education;Patient/family education;Manual techniques;Passive range of motion;Energy conservation;Taping   PT Next Visit Plan Next session focus on lumbar and Bil hip mobiltiyi and flexibilty.  Manual Bil lumbar paraspinals.  Consider hip seatbelt mobilization bilaterally as well.    PT Home Exercise Plan Eval: SKTC, lumbar rotations, 3D hip excursions       Patient will benefit from skilled therapeutic intervention in order to improve the following deficits and impairments:     Visit Diagnosis: Chronic midline low back pain with right-sided sciatica  Stiffness of left hip, not elsewhere classified  Stiffness of right hip, not elsewhere classified  Abnormal posture  Other symptoms and signs involving the musculoskeletal system     Problem List Patient Active Problem List   Diagnosis Date Noted  . 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 [place], [place], [address] Phone: [phone]   Fax:  [phone]  Name: Carol [name] MRN: [id number] Date of Birth: [birth year]

## 2016-08-28 NOTE — Telephone Encounter (Signed)
Post ED Visit - Positive Culture Follow-up  Culture report reviewed by antimicrobial stewardship pharmacist:  []  Elenor Quinones, Pharm.D. []  Heide Guile, Pharm.D., BCPS []  Parks Neptune, Pharm.D. []  Alycia Rossetti, Pharm.D., BCPS []  [place], [place].D., BCPS, AAHIVP []  Legrand Como, Pharm.D., BCPS, AAHIVP []  Milus Glazier, Pharm.D. []  Rob Mildred, Pharm.DStanford Breed, Pharm.D.  Positive urine culture, >/= 100,000 colonies -> enterococcus faecalis Treated with sulfa-trimeth. Chart reviewed by Carlisle Cater PA "No change"  Dortha Kern 08/28/2016, 11:01 AM

## 2016-08-29 ENCOUNTER — Ambulatory Visit (HOSPITAL_COMMUNITY): Payer: BLUE CROSS/BLUE SHIELD

## 2016-08-29 DIAGNOSIS — G8929 Other chronic pain: Secondary | ICD-10-CM

## 2016-08-29 DIAGNOSIS — R29898 Other symptoms and signs involving the musculoskeletal system: Secondary | ICD-10-CM

## 2016-08-29 DIAGNOSIS — M5441 Lumbago with sciatica, right side: Secondary | ICD-10-CM | POA: Diagnosis not present

## 2016-08-29 DIAGNOSIS — M25651 Stiffness of right hip, not elsewhere classified: Secondary | ICD-10-CM

## 2016-08-29 DIAGNOSIS — R293 Abnormal posture: Secondary | ICD-10-CM

## 2016-08-29 DIAGNOSIS — M25652 Stiffness of left hip, not elsewhere classified: Secondary | ICD-10-CM

## 2016-08-29 NOTE — Therapy (Signed)
[place], [place], [address] Phone: [phone]   Fax:  [phone]  Physical Therapy Treatment  Patient Details  Name: Carol [name] MRN: [id number] Date of Birth: 12/26/1965 Referring Provider: Sanjuana Kava  Encounter Date: 08/29/2016      PT End of Session - 08/29/16 1452    Visit Number 4   Number of Visits 12   Date for PT Re-Evaluation 09/16/16   Authorization Type BCBS Other (19 visits left including this eval)   Authorization Time Period 08/20/16 to 10/01/16   Authorization - Visit Number 4   Authorization - Number of Visits 10   PT Start Time [id number]   PT Stop Time [id number]  MHP at EOS no charge   PT Time Calculation (min) 56 min   Activity Tolerance Patient tolerated treatment well   Behavior During Therapy [facility] for tasks assessed/performed      Past Medical History:  Diagnosis Date  . Back pain   . Bronchitis   . Cervicalgia   . History of blood in urine    microscopic  . History of migraine headaches   . Hypertension   . Impaired fasting glucose     Past Surgical History:  Procedure Laterality Date  . ABDOMINAL HYSTERECTOMY  2002  . CHOLECYSTECTOMY    . TUBAL LIGATION      There were no vitals filed for this visit.      Subjective Assessment - 08/29/16 1436    Subjective Pt stated she woke up with increased pain on lateral aspect of Lt ankle, no reports of back pain currently.  Reports compliance with HEP and increased awareness of posture   Pertinent History no significant PMH/PSH   Patient Stated Goals get back to PLOF    Currently in Pain? No/denies   Pain Score 5    Pain Location Foot  Dull pain with weight bearing on Lt lateral foot no radicular symptoms   Pain Orientation Lower   Pain Descriptors / Indicators Dull  Dull pain with weight bearing   Pain Type Chronic pain   Pain Radiating Towards No reports of radicular symptoms, does c/o lateral Lt ankle pain today   Pain Onset  1 to 4 weeks ago   Pain Frequency Constant   Aggravating Factors  bending over, squatting   Pain Relieving Factors laying on her side   Effect of Pain on Daily Activities cannot perform PLOF based tasks                         [facility] Adult PT Treatment/Exercise - 08/29/16 0001      Lumbar Exercises: Stretches   Prone on Elbows Stretch --  3 min then pt completed quad set with 1 arm to foot   Quadruped Mid Back Stretch 3 reps;30 seconds   Quadruped Mid Back Stretch Limitations child's pose 2x each direction   Quad Stretch 30 seconds;1 rep   Quad Stretch Limitations prone      Lumbar Exercises: Standing   Other Standing Lumbar Exercises 3D hip excursion 10x     Lumbar Exercises: Seated   Other Seated Lumbar Exercises 3D Thoracic excursion for mobility with UE movement 10x each direction     Lumbar Exercises: Quadruped   [place]/Old Horse 10 reps   Madcat/Old Horse Limitations quadruped rotation 10x each   Straight Leg Raise 5 reps     Modalities   Modalities Moist Heat  Moist Heat Therapy   Number Minutes Moist Heat 10 Minutes   Moist Heat Location Lumbar Spine  per pt request at EOS     Manual Therapy   Manual Therapy Soft tissue mobilization   Manual therapy comments Manual complete separate rest of tx   Soft tissue mobilization prone position with STM to lumbar paraspinals                PT Education - 08/28/16 1828    Education provided Yes   Education Details Reviewed importance of proper bed mobility   Person(s) Educated Patient   Methods Explanation;Demonstration   Comprehension Verbalized understanding;Returned demonstration;Need further instruction          PT Short Term Goals - 08/20/16 0917      PT SHORT TERM GOAL #1   Title Patient to demonstrate only moderate (50-60%) limitation in lumbar ROM on all planes in order to reduce pain and improve overall mechanics    Time 3   Period Weeks   Status New     PT SHORT TERM GOAL  #2   Title Patient to demonstrate only moderate (50-60%) limitation in bilateral hip ROM on all planes in order to reduce pain and improve overall mechanics    Time 3   Period Weeks   Status New     PT SHORT TERM GOAL #3   Title Patient to be able to maintain correct posture at least 75% of the time in order to correct mechanics and improve functional task performance    Time 3   Period Weeks   Status New     PT SHORT TERM GOAL #4   Title Patient to be independent in correctly and consistently performing appropriate HEP, to be updated PRN    Time 1   Period Weeks   Status New           PT Long Term Goals - 08/20/16 0919      PT LONG TERM GOAL #1   Title Patient to demonstrate lumbar and bilateral hip ROM as being only mildly (30% or less) limited on all planes in order to improve mechanics and reduce pain    Time 6   Period Weeks   Status New     PT LONG TERM GOAL #2   Title Patient to demonstrate functional strength 5/5 in all tested muscle groups in order to improve functional task tolerance and performance    Time 6   Period Weeks   Status New     PT LONG TERM GOAL #3   Title Patient to report she has been able to return to all PLOF based activities with pain no more than 3/10 in order to improve QOL    Time 6   Period Weeks   Status New     PT LONG TERM GOAL #4   Title Patient to be independent in appropriate regular exercise program, at least 20-30 minutes in duration and at least 3 days per week, in order to maintain functional gains and improve general health status    Time 6   Period Weeks   Status New               Plan - 08/29/16 1509    Clinical Impression Statement Pt making good progress towards goals with vast improvements with awareness of posture and presentation today as well as reports of compliance wiht HEP and reports of pain reduced.  Continued session focus on improving spinal mobilty and  proximal musculature strengthening.  Began  quadruped spinal mobilty exercises and added UE motion with 3D thoracic excursion to increased rotation.  Pt able to demonstrate appropraite technqiue with minimal cueing today.  Ended session with STM to address tightness of lumbar paraspinals, no reports of pain.   Rehab Potential Good   Clinical Impairments Affecting Rehab Potential (+) high PLOF, appears motivated to participate with skilled PT services; (-) active smoker, physical job with repetitive motions    PT Frequency 2x / week   PT Duration 6 weeks   PT Treatment/Interventions ADLs/Self Care Home Management;Biofeedback;Moist Heat;Gait training;Stair training;Functional mobility training;Therapeutic activities;Therapeutic exercise;Balance training;Neuromuscular re-education;Patient/family education;Manual techniques;Passive range of motion;Energy conservation;Taping   PT Next Visit Plan Next session focus on lumbar and Bil hip mobiltiyi and flexibilty.  Manual Bil lumbar paraspinals.  Consider hip seatbelt mobilization bilaterally as well.       Patient will benefit from skilled therapeutic intervention in order to improve the following deficits and impairments:  Abnormal gait, Increased fascial restricitons, Improper body mechanics, Pain, Decreased mobility, Decreased coordination, Increased muscle spasms, Postural dysfunction, Decreased strength, Hypomobility, Difficulty walking, Impaired flexibility  Visit Diagnosis: Chronic midline low back pain with right-sided sciatica  Stiffness of left hip, not elsewhere classified  Stiffness of right hip, not elsewhere classified  Abnormal posture  Other symptoms and signs involving the musculoskeletal system     Problem List Patient Active Problem List   Diagnosis Date Noted  . 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

## 2016-09-02 ENCOUNTER — Ambulatory Visit (HOSPITAL_COMMUNITY): Payer: BLUE CROSS/BLUE SHIELD

## 2016-09-04 ENCOUNTER — Encounter (HOSPITAL_COMMUNITY): Payer: BLUE CROSS/BLUE SHIELD

## 2016-09-09 ENCOUNTER — Emergency Department (HOSPITAL_COMMUNITY)
Admission: EM | Admit: 2016-09-09 | Discharge: 2016-09-09 | Disposition: A | Discharge status: Home / Self Care | Payer: BLUE CROSS/BLUE SHIELD | Attending: Emergency Medicine | Admitting: Emergency Medicine

## 2016-09-09 ENCOUNTER — Emergency Department (HOSPITAL_COMMUNITY): Payer: BLUE CROSS/BLUE SHIELD

## 2016-09-09 ENCOUNTER — Encounter (HOSPITAL_COMMUNITY): Payer: Self-pay

## 2016-09-09 ENCOUNTER — Ambulatory Visit (HOSPITAL_COMMUNITY): Payer: BLUE CROSS/BLUE SHIELD

## 2016-09-09 ENCOUNTER — Telehealth (HOSPITAL_COMMUNITY): Payer: Self-pay

## 2016-09-09 DIAGNOSIS — F17210 Nicotine dependence, cigarettes, uncomplicated: Secondary | ICD-10-CM | POA: Insufficient documentation

## 2016-09-09 DIAGNOSIS — B349 Viral infection, unspecified: Secondary | ICD-10-CM | POA: Insufficient documentation

## 2016-09-09 DIAGNOSIS — R0602 Shortness of breath: Secondary | ICD-10-CM

## 2016-09-09 DIAGNOSIS — R1031 Right lower quadrant pain: Secondary | ICD-10-CM | POA: Insufficient documentation

## 2016-09-09 DIAGNOSIS — I10 Essential (primary) hypertension: Secondary | ICD-10-CM | POA: Insufficient documentation

## 2016-09-09 DIAGNOSIS — Z79899 Other long term (current) drug therapy: Secondary | ICD-10-CM | POA: Insufficient documentation

## 2016-09-09 LAB — URINALYSIS, ROUTINE W REFLEX MICROSCOPIC
Bilirubin Urine: NEGATIVE
Glucose, UA: >=500 mg/dL — AB
Ketones, ur: NEGATIVE mg/dL
Nitrite: NEGATIVE
PH: 6 (ref 5.0–8.0)
Protein, ur: NEGATIVE mg/dL
SPECIFIC GRAVITY, URINE: 1.014 (ref 1.005–1.030)

## 2016-09-09 LAB — CBC WITH DIFFERENTIAL/PLATELET
BASOS ABS: 0 10*3/uL (ref 0.0–0.1)
BASOS PCT: 0 %
Eosinophils Absolute: 0.1 10*3/uL (ref 0.0–0.7)
Eosinophils Relative: 1 %
HCT: 36.9 % (ref 36.0–46.0)
HEMOGLOBIN: 13 g/dL (ref 12.0–15.0)
LYMPHS ABS: 4 10*3/uL (ref 0.7–4.0)
Lymphocytes Relative: 38 %
MCH: 29.1 pg (ref 26.0–34.0)
MCHC: 35.2 g/dL (ref 30.0–36.0)
MCV: 82.7 fL (ref 78.0–100.0)
Monocytes Absolute: 0.7 10*3/uL (ref 0.1–1.0)
Monocytes Relative: 6 %
NEUTROS PCT: 55 %
Neutro Abs: 5.8 10*3/uL (ref 1.7–7.7)
Platelets: 365 10*3/uL (ref 150–400)
RBC: 4.46 MIL/uL (ref 3.87–5.11)
RDW: 12.2 % (ref 11.5–15.5)
WBC: 10.6 10*3/uL — AB (ref 4.0–10.5)

## 2016-09-09 LAB — PREGNANCY, URINE: PREG TEST UR: NEGATIVE

## 2016-09-09 LAB — COMPREHENSIVE METABOLIC PANEL
ALBUMIN: 3.8 g/dL (ref 3.5–5.0)
ALK PHOS: 94 U/L (ref 38–126)
ALT: 18 U/L (ref 14–54)
AST: 15 U/L (ref 15–41)
Anion gap: 6 (ref 5–15)
BUN: 11 mg/dL (ref 6–20)
CALCIUM: 9.1 mg/dL (ref 8.9–10.3)
CHLORIDE: 100 mmol/L — AB (ref 101–111)
CO2: 29 mmol/L (ref 22–32)
CREATININE: 0.79 mg/dL (ref 0.44–1.00)
GFR calc Af Amer: >60 mL/min (ref >60)
GFR calc non Af Amer: >60 mL/min (ref >60)
GLUCOSE: 222 mg/dL — AB (ref 65–99)
Potassium: 3.6 mmol/L (ref 3.5–5.1)
SODIUM: 135 mmol/L (ref 135–145)
Total Bilirubin: 0.1 mg/dL — ABNORMAL LOW (ref 0.3–1.2)
Total Protein: 7.5 g/dL (ref 6.5–8.1)

## 2016-09-09 LAB — TROPONIN I

## 2016-09-09 LAB — LIPASE, BLOOD: Lipase: 38 U/L (ref 11–51)

## 2016-09-09 LAB — D-DIMER, QUANTITATIVE (NOT AT ARMC): D DIMER QUANT: 0.55 ug{FEU}/mL — AB (ref 0.00–0.50)

## 2016-09-09 MED ORDER — IOPAMIDOL (ISOVUE-300) INJECTION 61%
100.0000 mL | Freq: Once | INTRAVENOUS | Status: AC | PRN
  Administered 2016-09-09: 100 mL via INTRAVENOUS

## 2016-09-09 MED ORDER — ONDANSETRON 8 MG PO TBDP
8.0000 mg | ORAL_TABLET | Freq: Once | ORAL | Status: AC
  Administered 2016-09-09: 8 mg via ORAL
  Filled 2016-09-09: qty 1

## 2016-09-09 MED ORDER — IOPAMIDOL (ISOVUE-300) INJECTION 61%
30.0000 mL | Freq: Once | INTRAVENOUS | Status: AC | PRN
  Administered 2016-09-09: 30 mL via ORAL

## 2016-09-09 MED ORDER — TECHNETIUM TC 99M DIETHYLENETRIAME-PENTAACETIC ACID
30.0000 | Freq: Once | INTRAVENOUS | Status: AC
  Administered 2016-09-09: 30.6 via INTRAVENOUS

## 2016-09-09 MED ORDER — IPRATROPIUM-ALBUTEROL 0.5-2.5 (3) MG/3ML IN SOLN
3.0000 mL | Freq: Once | RESPIRATORY_TRACT | Status: AC
  Administered 2016-09-09: 3 mL via RESPIRATORY_TRACT
  Filled 2016-09-09: qty 3

## 2016-09-09 MED ORDER — ONDANSETRON 4 MG PO TBDP: 4.0000 mg | ORAL_TABLET | Freq: Three times a day (TID) | ORAL | 0 refills | Status: DC | PRN

## 2016-09-09 MED ORDER — BENZONATATE 100 MG PO CAPS: 100.0000 mg | ORAL_CAPSULE | Freq: Three times a day (TID) | ORAL | 0 refills | Status: DC | PRN

## 2016-09-09 MED ORDER — ALBUTEROL SULFATE HFA 108 (90 BASE) MCG/ACT IN AERS: 2.0000 | INHALATION_SPRAY | RESPIRATORY_TRACT | 0 refills | Status: DC | PRN

## 2016-09-09 MED ORDER — TECHNETIUM TO 99M ALBUMIN AGGREGATED
3.0000 | Freq: Once | INTRAVENOUS | Status: AC | PRN
  Administered 2016-09-09: 4.1 via INTRAVENOUS

## 2016-09-09 MED ORDER — ALBUTEROL SULFATE (2.5 MG/3ML) 0.083% IN NEBU
2.5000 mg | INHALATION_SOLUTION | Freq: Once | RESPIRATORY_TRACT | Status: AC
  Administered 2016-09-09: 2.5 mg via RESPIRATORY_TRACT
  Filled 2016-09-09: qty 3

## 2016-09-09 NOTE — ED Provider Notes (Signed)
[place] DEPT Provider Note   CSN: [id number] Arrival date & time: 09/09/16  1125     History   Chief Complaint Chief Complaint  Patient presents with  . Generalized Body Aches    HPI Carol [name] is a 51 y.o. female.  HPI  Pt was seen at 1540.  Per pt, c/o gradual onset and persistence of constant cough, sinus congestion, runny/stuffy nose, and generalized body aches for the past 1 week. States today she became SOB with coughing today. States she had one brief "passing out" episode 3 days ago; none since. Endorses hx of chronic, ongoing abd pain "for a while now." Pain is located in her RLQ, which has been associated with nausea.  Denies fevers, no rash, no CP/SOB, no vomiting/diarrhea, no back pain, no visual changes, no focal motor weakness, no tingling/numbness in extremities, no ataxia, no slurred speech, no facial droop.      Past Medical History:  Diagnosis Date  . Back pain   . Bronchitis   . Cervicalgia   . History of blood in urine    microscopic  . History of migraine headaches   . Hypertension   . Impaired fasting glucose     Patient Active Problem List   Diagnosis Date Noted  . 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 ABDOMINAL HYSTERECTOMY  2002  . CHOLECYSTECTOMY    . TUBAL LIGATION      OB History    No data available       Home Medications    Prior to Admission medications   Medication Sig Start Date End Date Taking? Authorizing Provider  Diphenhydramine-APAP (TYLENOL DAY/NIGHT EX ST) 25-500 & 500 MG MISC Take 2 capsules by mouth daily as needed (cold).   Yes Historical Provider, MD  guaifenesin (ROBITUSSIN CHEST CONGESTION) 100 MG/5ML syrup Take 200 mg by mouth 3 (three) times daily as needed for cough.   Yes Historical Provider, MD  HYDROcodone-acetaminophen (NORCO) 7.5-325 MG tablet Take 1 tablet by mouth every 4 (four) hours as needed for moderate pain (Must last 30 days.  Do not  drive a call or operate machinery while on this medicine.). 07/29/16  Yes Sanjuana Kava, MD  ketorolac (TORADOL) 10 MG tablet Take 1 tablet (10 mg total) by mouth every 8 (eight) hours as needed. Take with food 08/24/16  Yes Tammy Triplett, PA-C  methocarbamol (ROBAXIN) 500 MG tablet Take 1 tablet (500 mg total) by mouth 3 (three) times daily. 08/24/16  Yes Tammy Triplett, PA-C  Phenylephrine-DM-GG-APAP (TYLENOL COLD/FLU SEVERE PO) Take 30 mLs by mouth daily as needed (cold).   Yes Historical Provider, MD  sulfamethoxazole-trimethoprim (BACTRIM DS,SEPTRA DS) 800-160 MG tablet Take 1 tablet by mouth 2 (two) times daily.   Yes Historical Provider, MD  cyclobenzaprine (FLEXERIL) 10 MG tablet Take 1 tablet (10 mg total) by mouth 3 (three) times daily as needed. Patient not taking: Reported on 09/09/2016 07/22/16   Kem Parkinson, PA-C    Family History Family History  Problem Relation Age of Onset  . Arthritis Paternal Grandmother   . Diabetes Paternal Grandmother   . Hypertension Paternal Grandmother   . Stroke Paternal Grandmother   . Diabetes Mother   . Colon cancer Neg Hx     Social History Social History  Substance Use Topics  . Smoking status: Current Every Day Smoker    Packs/day: 0.50    Years: 20.00    Types: Cigarettes, Cigars  .  Smokeless tobacco: Never Used     Comment: Tobacco info given  . Alcohol use Yes     Comment: occ.     Allergies   Naftin [naftifine hcl]   Review of Systems Review of Systems ROS: Statement: All systems negative except as marked or noted in the HPI; Constitutional: Negative for fever and chills. ; ; Eyes: Negative for eye pain, redness and discharge. ; ; ENMT: Negative for ear pain, hoarseness, sore throat. +nasal congestion, sinus pressure and rhinorrhea. ; ; Cardiovascular: Negative for chest pain, palpitations, diaphoresis, and peripheral edema. ; ; Respiratory: +cough, SOB. Negative for wheezing and stridor. ; ; Gastrointestinal: +abd pain,  nausea. Negative for vomiting, diarrhea, blood in stool, hematemesis, jaundice and rectal bleeding. . ; ; Genitourinary: Negative for dysuria, flank pain and hematuria. ; ; Musculoskeletal: Negative for back pain and neck pain. Negative for swelling and trauma.; ; Skin: Negative for pruritus, rash, abrasions, blisters, bruising and skin lesion.; ; Neuro: +syncope 3 days ago. Negative for headache, lightheadedness and neck stiffness. Negative for weakness, extremity weakness, paresthesias, involuntary movement, seizure.      Physical Exam Updated Vital Signs BP 151/76 (BP Location: Right Arm)   Pulse 90   Temp 98.1 F (36.7 C)   Resp 18   Ht 5\' 7"  (1.702 m)   Wt 176 lb (79.8 kg)   SpO2 99%   BMI 27.57 kg/m    BP 115/62   Pulse 72   Temp 98.1 F (36.7 C)   Resp 13   Ht 5\' 7"  (1.702 m)   Wt 176 lb (79.8 kg)   SpO2 100%   BMI 27.57 kg/m    Physical Exam 1545: Physical examination:  Nursing notes reviewed; Vital signs and O2 SAT reviewed;  Constitutional: Well developed, Well nourished, Well hydrated, In no acute distress; Head:  Normocephalic, atraumatic; Eyes: EOMI, PERRL, No scleral icterus; ENMT: Mouth and pharynx normal, Mucous membranes moist; Neck: Supple, Full range of motion, No lymphadenopathy. No meningeal signs; Cardiovascular: Regular rate and rhythm, No gallop; Respiratory: Breath sounds coarse & equal bilaterally, No wheezes.  Speaking full sentences with ease, Normal respiratory effort/excursion; Chest: Nontender, Movement normal; Abdomen: Soft, +RLQ tenderness to palp. No rebound or guarding. Nondistended, Normal bowel sounds; Genitourinary: No CVA tenderness; Extremities: Pulses normal, No tenderness, No edema, No calf edema or asymmetry.; Neuro: AA&Ox3, Major CN grossly intact.  Speech clear. No gross focal motor or sensory deficits in extremities.; Skin: Color normal, Warm, Dry.   ED Treatments / Results  Labs (all labs ordered are listed, but only abnormal results  are displayed)   EKG  EKG Interpretation  Date/Time:  Tuesday September 09 2016 15:37:26 EST Ventricular Rate:  89 PR Interval:    QRS Duration: 82 QT Interval:  349 QTC Calculation: 425 R Axis:   50 Text Interpretation:  Sinus rhythm Borderline repolarization abnormality When compared with ECG of 09/21/2015 No significant change was found Confirmed by [facility]  MD, Nunzio Cory [phone]) on 09/09/2016 3:54:50 PM       Radiology   Procedures Procedures (including critical care time)  Medications Ordered in ED Medications  albuterol (PROVENTIL) (2.5 MG/3ML) 0.083% nebulizer solution 2.5 mg (2.5 mg Nebulization Given 09/09/16 1550)  ipratropium-albuterol (DUONEB) 0.5-2.5 (3) MG/3ML nebulizer solution 3 mL (3 mLs Nebulization Given 09/09/16 1550)  ondansetron (ZOFRAN-ODT) disintegrating tablet 8 mg (8 mg Oral Given 09/09/16 1620)  iopamidol (ISOVUE-300) 61 % injection 30 mL (30 mLs Oral Contrast Given 09/09/16 1558)     Initial  Impression / Assessment and Plan / ED Course  I have reviewed the triage vital signs and the nursing notes.  Pertinent labs & imaging results that were available during my care of the patient were reviewed by me and considered in my medical decision making (see chart for details).  MDM Reviewed: previous chart, nursing note and vitals Reviewed previous: labs and ECG Interpretation: labs, ECG, x-ray and CT scan   Results for orders placed or performed during the hospital encounter of 09/09/16  CBC with Differential  Result Value Ref Range   WBC 10.6 (H) 4.0 - 10.5 K/uL   RBC 4.46 3.87 - 5.11 MIL/uL   Hemoglobin 13.0 12.0 - 15.0 g/dL   HCT 36.9 36.0 - 46.0 %   MCV 82.7 78.0 - 100.0 fL   MCH 29.1 26.0 - 34.0 pg   MCHC 35.2 30.0 - 36.0 g/dL   RDW 12.2 11.5 - 15.5 %   Platelets 365 150 - 400 K/uL   Neutrophils Relative % 55 %   Neutro Abs 5.8 1.7 - 7.7 K/uL   Lymphocytes Relative 38 %   Lymphs Abs 4.0 0.7 - 4.0 K/uL   Monocytes Relative 6 %   Monocytes  Absolute 0.7 0.1 - 1.0 K/uL   Eosinophils Relative 1 %   Eosinophils Absolute 0.1 0.0 - 0.7 K/uL   Basophils Relative 0 %   Basophils Absolute 0.0 0.0 - 0.1 K/uL  Comprehensive metabolic panel  Result Value Ref Range   Sodium 135 135 - 145 mmol/L   Potassium 3.6 3.5 - 5.1 mmol/L   Chloride 100 (L) 101 - 111 mmol/L   CO2 29 22 - 32 mmol/L   Glucose, Bld 222 (H) 65 - 99 mg/dL   BUN 11 6 - 20 mg/dL   Creatinine, Ser 0.79 0.44 - 1.00 mg/dL   Calcium 9.1 8.9 - 10.3 mg/dL   Total Protein 7.5 6.5 - 8.1 g/dL   Albumin 3.8 3.5 - 5.0 g/dL   AST 15 15 - 41 U/L   ALT 18 14 - 54 U/L   Alkaline Phosphatase 94 38 - 126 U/L   Total Bilirubin 0.1 (L) 0.3 - 1.2 mg/dL   GFR calc non Af Amer >60 >60 mL/min   GFR calc Af Amer >60 >60 mL/min   Anion gap 6 5 - 15  Lipase, blood  Result Value Ref Range   Lipase 38 11 - 51 U/L  Urinalysis, Routine w reflex microscopic  Result Value Ref Range   Color, Urine YELLOW YELLOW   APPearance CLEAR CLEAR   Specific Gravity, Urine 1.014 1.005 - 1.030   pH 6.0 5.0 - 8.0   Glucose, UA >=500 (A) NEGATIVE mg/dL   Hgb urine dipstick MODERATE (A) NEGATIVE   Bilirubin Urine NEGATIVE NEGATIVE   Ketones, ur NEGATIVE NEGATIVE mg/dL   Protein, ur NEGATIVE NEGATIVE mg/dL   Nitrite NEGATIVE NEGATIVE   Leukocytes, UA TRACE (A) NEGATIVE   RBC / HPF 0-5 0 - 5 RBC/hpf   WBC, UA 6-30 0 - 5 WBC/hpf   Bacteria, UA RARE (A) NONE SEEN  Pregnancy, urine  Result Value Ref Range   Preg Test, Ur NEGATIVE NEGATIVE  Troponin I  Result Value Ref Range   Troponin I <0.03 <0.03 ng/mL  D-dimer, quantitative  Result Value Ref Range   D-Dimer, Quant 0.55 (H) 0.00 - 0.50 ug/mL-FEU   Dg Chest 2 View Result Date: 09/09/2016 CLINICAL DATA:  51 year old female with cough shortness of breath chills and body aches  for 2 weeks. Initial encounter. Smoker. EXAM: CHEST  2 VIEW COMPARISON:  11/08/2014 and earlier. FINDINGS: Lung volumes remain normal. Mediastinal contours remain normal.  Visualized tracheal air column is within normal limits. The lungs are clear. No pneumothorax or pleural effusion. The Stable cholecystectomy clips. Negative visible bowel gas pattern. No acute osseous abnormality identified. IMPRESSION: Negative.  No acute cardiopulmonary abnormality. Electronically Signed   By: Genevie Ann M.D.   On: 09/09/2016 15:11   Ct Abdomen Pelvis W Contrast Result Date: 09/09/2016 CLINICAL DATA:  Right lower quadrant abdominal pain EXAM: CT ABDOMEN AND PELVIS WITH CONTRAST TECHNIQUE: Multidetector CT imaging of the abdomen and pelvis was performed using the standard protocol following bolus administration of intravenous contrast. CONTRAST:  100 mL Isovue 300 IV COMPARISON:  07/18/2016 FINDINGS: Lower chest: Lung bases are clear. Hepatobiliary: Liver is within normal limits. No suspicious/enhancing hepatic lesions. Status post cholecystectomy. No intrahepatic or extrahepatic ductal dilatation. Pancreas: Within normal limits. Spleen: Within normal limits. Adrenals/Urinary Tract: Adrenal glands are within normal limits. Kidneys are within normal limits.  No hydronephrosis. Bladder is within normal limits. Stomach/Bowel: Stomach is within normal limits. No evidence of bowel obstruction. Normal appendix (series 2/image 51). Vascular/Lymphatic: No evidence of abdominal aortic aneurysm. Mild atherosclerotic calcifications the abdominal aorta. No suspicious abdominopelvic lymphadenopathy. Reproductive: Status post hysterectomy. Bilateral ovaries are within normal limits. Other: No abdominopelvic ascites. Musculoskeletal: Mild degenerative changes of the visualized thoracolumbar spine. IMPRESSION: No evidence of bowel obstruction.  Normal appendix. Status post cholecystectomy and hysterectomy. No CT findings to account patient's right lower quadrant abdominal pain. Electronically Signed   By: Julian Hy M.D.   On: 09/09/2016 17:17   Nm Pulmonary Perf And Vent Result Date: 09/09/2016 CLINICAL  DATA:  Subacute onset of shortness of breath. Syncope. Initial encounter. EXAM: NUCLEAR MEDICINE VENTILATION - PERFUSION LUNG SCAN TECHNIQUE: Ventilation images were obtained in multiple projections using inhaled aerosol Tc-54m DTPA. Perfusion images were obtained in multiple projections after intravenous injection of Tc-39m MAA. RADIOPHARMACEUTICALS:  30.6 mCi Technetium-35m DTPA aerosol inhalation and 4.1 mCi Technetium-78m MAA IV COMPARISON:  Chest radiograph performed earlier today at 2:58 p.m. FINDINGS: Ventilation: No focal ventilation defect. Visualized ventilation is grossly normal bilaterally. Perfusion: No wedge shaped peripheral perfusion defects to suggest acute pulmonary embolism. IMPRESSION: Normal V/Q scan.  No evidence of pulmonary embolus. Electronically Signed   By: Garald Balding M.D.   On: 09/09/2016 20:30    2045:  Workup reassuring. No clear UTI on Udip and pt denies dysuria. Pt has tol PO well while in the ED without N/V. Feels better after neb and wants to go home now. Tx symptomatically, f/u PMD. Dx and testing d/w pt and family.  Questions answered.  Verb understanding, agreeable to d/c home with outpt f/u.    Final Clinical Impressions(s) / ED Diagnoses   Final diagnoses:  None    New Prescriptions New Prescriptions   No medications on file     Francine Graven, DO 09/12/16 1547

## 2016-09-09 NOTE — ED Provider Notes (Signed)
MSE was initiated and I personally evaluated the patient and placed orders (if any) at  3:00 PM on September 09, 2016.  Patient was initially placed into fast track due to complaint of flu-like symptoms. When I evaluated her she was complaining of syncope, shortness of breath, and right lower quadrant abdominal pain with nausea.  patient will be moved to the acute side as she is requiring further workup not appropriate for fast track.  Orders were placed for EKG, chest x-ray, lipase,  Bc, CMP,  and urine.  The patient appears stable so that the remainder of the MSE may be completed by another provider.   Emeline General, PA-C 09/09/16 [place], MD 09/12/16 2230

## 2016-09-09 NOTE — Discharge Instructions (Signed)
Take over the counter decongestant (such as sudafed), as directed on packaging, for the next week.  Use over the counter normal saline nasal spray, as instructed in the Emergency Department, several times per day for the next 2 weeks. Take the prescriptions as directed.  Use your albuterol inhaler (2 to 4 puffs) every 4 hours for the next 7 days, then as needed for cough, wheezing, or shortness of breath.  Call your regular medical doctor tomorrow morning to schedule a follow up appointment within the next 3 days.  Return to the Emergency Department immediately sooner if worsening.

## 2016-09-09 NOTE — ED Triage Notes (Signed)
Pt reports flu like symptom x one week. Complaining of body aches, coughing and headache.

## 2016-09-09 NOTE — ED Notes (Signed)
Pt states she has one episode of loss of consciousness on Saturday. Pt currently at baseline. NAD noted. Pt is alert and oriented.

## 2016-09-09 NOTE — ED Notes (Signed)
Patient transported to X-ray 

## 2016-09-09 NOTE — ED Notes (Signed)
Patient transported to CT 

## 2016-09-09 NOTE — Telephone Encounter (Signed)
She is at the hospital

## 2016-09-09 NOTE — ED Notes (Addendum)
Patient drinking Ginger ale at this time. States that breathing treatment helped. States she is sore on right side but no pain.

## 2016-09-10 ENCOUNTER — Encounter: Payer: Self-pay | Admitting: Orthopaedic Surgery

## 2016-09-10 ENCOUNTER — Ambulatory Visit (INDEPENDENT_AMBULATORY_CARE_PROVIDER_SITE_OTHER): Payer: Self-pay | Admitting: Orthopaedic Surgery

## 2016-09-10 VITALS — BP 118/78 | HR 93 | Temp 97.5°F | Ht 69.58 in | Wt 169.0 lb

## 2016-09-10 DIAGNOSIS — G8929 Other chronic pain: Secondary | ICD-10-CM

## 2016-09-10 DIAGNOSIS — M545 Low back pain: Secondary | ICD-10-CM

## 2016-09-10 DIAGNOSIS — F17210 Nicotine dependence, cigarettes, uncomplicated: Secondary | ICD-10-CM

## 2016-09-10 MED ORDER — HYDROCODONE-ACETAMINOPHEN 7.5-325 MG PO TABS: 1.0000 | ORAL_TABLET | ORAL | 0 refills | Status: DC | PRN

## 2016-09-10 NOTE — Patient Instructions (Addendum)
Stop therapy.     Steps to Quit Smoking Smoking tobacco can be bad for your health. It can also affect almost every organ in your body. Smoking puts you and people around you at risk for many serious long-lasting (chronic) diseases. Quitting smoking is hard, but it is one of the best things that you can do for your health. It is never too late to quit. What are the benefits of quitting smoking? When you quit smoking, you lower your risk for getting serious diseases and conditions. They can include:  Lung cancer or lung disease.  Heart disease.  Stroke.  Heart attack.  Not being able to have children (infertility).  Weak bones (osteoporosis) and broken bones (fractures). If you have coughing, wheezing, and shortness of breath, those symptoms may get better when you quit. You may also get sick less often. If you are pregnant, quitting smoking can help to lower your chances of having a baby of low birth weight. What can I do to help me quit smoking? Talk with your doctor about what can help you quit smoking. Some things you can do (strategies) include:  Quitting smoking totally, instead of slowly cutting back how much you smoke over a period of time.  Going to in-person counseling. You are more likely to quit if you go to many counseling sessions.  Using resources and support systems, such as:  Online chats with a Social worker.  Phone quitlines.  Printed Furniture conservator/restorer.  Support groups or group counseling.  Text messaging programs.  Mobile phone apps or applications.  Taking medicines. Some of these medicines may have nicotine in them. If you are pregnant or breastfeeding, do not take any medicines to quit smoking unless your doctor says it is okay. Talk with your doctor about counseling or other things that can help you. Talk with your doctor about using more than one strategy at the same time, such as taking medicines while you are also going to in-person counseling. This  can help make quitting easier. What things can I do to make it easier to quit? Quitting smoking might feel very hard at first, but there is a lot that you can do to make it easier. Take these steps:  Talk to your family and friends. Ask them to support and encourage you.  Call phone quitlines, reach out to support groups, or work with a Social worker.  Ask people who smoke to not smoke around you.  Avoid places that make you want (trigger) to smoke, such as:  Bars.  Parties.  Smoke-break areas at work.  Spend time with people who do not smoke.  Lower the stress in your life. Stress can make you want to smoke. Try these things to help your stress:  Getting regular exercise.  Deep-breathing exercises.  Yoga.  Meditating.  Doing a body scan. To do this, close your eyes, focus on one area of your body at a time from head to toe, and notice which parts of your body are tense. Try to relax the muscles in those areas.  Download or buy apps on your mobile phone or tablet that can help you stick to your quit plan. There are many free apps, such as QuitGuide from the [employer] Office manager for Disease Control and Prevention). You can find more support from smokefree.gov and other websites. This information is not intended to replace advice given to you by your health care provider. Make sure you discuss any questions you have with your health care provider. Document  Released: 05/31/2009 Document Revised: 04/01/2016 Document Reviewed: 12/19/2014 Elsevier Interactive Patient Education  2017 [employer].

## 2016-09-10 NOTE — Progress Notes (Signed)
Patient OP:[id number] L Sprecher, female DOB:1966/06/09, 51 y.o. [id number]  Chief Complaint  Patient presents with  . Follow-up    BACK PAIN    HPI  Carol [name] is a 51 y.o. female who has chronic lower back pain. She has been to PT and is better.  She has no paresthesias.  She has no weakness. She has had cold virus and is recovering. HPI  Body mass index is 24.54 kg/m.  ROS  Review of Systems  HENT: Negative for congestion.   Respiratory: Negative for cough and shortness of breath.   Cardiovascular: Negative for chest pain and leg swelling.  Endocrine: Negative for cold intolerance.  Musculoskeletal: Positive for arthralgias and back pain.  Allergic/Immunologic: Negative for environmental allergies.    Past Medical History:  Diagnosis Date  . Back pain   . Bronchitis   . Cervicalgia   . History of blood in urine    microscopic  . History of migraine headaches   . Hypertension   . Impaired fasting glucose     Past Surgical History:  Procedure Laterality Date  . ABDOMINAL HYSTERECTOMY  2002  . CHOLECYSTECTOMY    . TUBAL LIGATION      Family History  Problem Relation Age of Onset  . Arthritis Paternal Grandmother   . Diabetes Paternal Grandmother   . Hypertension Paternal Grandmother   . Stroke Paternal Grandmother   . Diabetes Mother   . Colon cancer Neg Hx     Social History Social History  Substance Use Topics  . Smoking status: Current Every Day Smoker    Packs/day: 0.50    Years: 20.00    Types: Cigarettes, Cigars  . Smokeless tobacco: Never Used     Comment: Tobacco info given  . Alcohol use Yes     Comment: occ.    Allergies  Allergen Reactions  . Naftin [Naftifine Hcl] Other (See Comments)    Peeling of skin, discoloration    Current Outpatient Prescriptions  Medication Sig Dispense Refill  . albuterol (PROVENTIL HFA;VENTOLIN HFA) 108 (90 Base) MCG/ACT inhaler Inhale 2 puffs into the lungs every 4 (four) hours as needed for wheezing or  shortness of breath. 1 Inhaler 0  . benzonatate (TESSALON) 100 MG capsule Take 1 capsule (100 mg total) by mouth 3 (three) times daily as needed for cough. 15 capsule 0  . cyclobenzaprine (FLEXERIL) 10 MG tablet Take 1 tablet (10 mg total) by mouth 3 (three) times daily as needed. (Patient not taking: Reported on 09/09/2016) 21 tablet 0  . Diphenhydramine-APAP (TYLENOL DAY/NIGHT EX ST) 25-500 & 500 MG MISC Take 2 capsules by mouth daily as needed (cold).    Marland Kitchen guaifenesin (ROBITUSSIN CHEST CONGESTION) 100 MG/5ML syrup Take 200 mg by mouth 3 (three) times daily as needed for cough.    Marland Kitchen HYDROcodone-acetaminophen (NORCO) 7.5-325 MG tablet Take 1 tablet by mouth every 4 (four) hours as needed for moderate pain (Must last 30 days.  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 110 tablet 0  . ketorolac (TORADOL) 10 MG tablet Take 1 tablet (10 mg total) by mouth every 8 (eight) hours as needed. Take with food 15 tablet 0  . methocarbamol (ROBAXIN) 500 MG tablet Take 1 tablet (500 mg total) by mouth 3 (three) times daily. 21 tablet 0  . ondansetron (ZOFRAN ODT) 4 MG disintegrating tablet Take 1 tablet (4 mg total) by mouth every 8 (eight) hours as needed for nausea or vomiting. 6 tablet  0  . Phenylephrine-DM-GG-APAP (TYLENOL COLD/FLU SEVERE PO) Take 30 mLs by mouth daily as needed (cold).    Marland Kitchen sulfamethoxazole-trimethoprim (BACTRIM DS,SEPTRA DS) 800-160 MG tablet Take 1 tablet by mouth 2 (two) times daily.     No current facility-administered medications for this visit.      Physical Exam  Blood pressure 118/78, pulse 93, temperature 97.5 F (36.4 C), height 5' 9.58" (1.767 m), weight 169 lb (76.7 kg).  Constitutional: overall normal hygiene, normal nutrition, well developed, normal grooming, normal body habitus. Assistive device:none  Musculoskeletal: gait and station Limp none, muscle tone and strength are normal, no tremors or atrophy is present.  .  Neurological:  coordination overall normal.  Deep tendon reflex/nerve stretch intact.  Sensation normal.  Cranial nerves II-XII intact.   Skin:   Normal overall no scars, lesions, ulcers or rashes. No psoriasis.  Psychiatric: Alert and oriented x 3.  Recent memory intact, remote memory unclear.  Normal mood and affect. Well groomed.  Good eye contact.  Cardiovascular: overall no swelling, no varicosities, no edema bilaterally, normal temperatures of the legs and arms, no clubbing, cyanosis and good capillary refill.  Lymphatic: palpation is normal.  Spine/Pelvis examination:  Inspection:  Overall, sacoiliac joint benign and hips nontender; without crepitus or defects.   Thoracic spine inspection: Alignment normal without kyphosis present   Lumbar spine inspection:  Alignment  with normal lumbar lordosis, without scoliosis apparent.   Thoracic spine palpation:  without tenderness of spinal processes   Lumbar spine palpation: with tenderness of lumbar area; without tightness of lumbar muscles    Range of Motion:   Lumbar flexion, forward flexion is 45 without pain or tenderness    Lumbar extension is full without pain or tenderness   Left lateral bend is Normal  without pain or tenderness   Right lateral bend is Normal without pain or tenderness   Straight leg raising is Normal   Strength & tone: Normal   Stability overall normal stability     The patient has been educated about the nature of the problem(s) and counseled on treatment options.  The patient appeared to understand what I have discussed and is in agreement with it.  Encounter Diagnoses  Name Primary?  . Chronic midline low back pain without sciatica Yes  . Cigarette nicotine dependence without complication     PLAN Call if any problems.  Precautions discussed.  Continue current medications.   Return to clinic 1 month   Pain medicine given after review of the state narcotic web site.  Electronically Signed Sanjuana Kava,  MD 1/24/20183:45 PM

## 2016-09-11 ENCOUNTER — Ambulatory Visit (HOSPITAL_COMMUNITY): Payer: BLUE CROSS/BLUE SHIELD | Admitting: Physical Therapy

## 2016-09-11 ENCOUNTER — Telehealth (HOSPITAL_COMMUNITY): Payer: Self-pay | Admitting: Physical Therapy

## 2016-09-11 NOTE — Telephone Encounter (Signed)
Pt did not show for appt (NS #1).  Called and left message on voicemail regarding missed appointment and reminder for next appointment 1/30 at 3:15.  Asked patient to notify clinic if she was unable to make her next appointment. Teena Irani, PTA/CLT [phone]

## 2016-09-16 ENCOUNTER — Telehealth (HOSPITAL_COMMUNITY): Payer: Self-pay | Admitting: Physical Therapy

## 2016-09-16 ENCOUNTER — Ambulatory Visit (HOSPITAL_COMMUNITY): Payer: BLUE CROSS/BLUE SHIELD | Admitting: Physical Therapy

## 2016-09-16 NOTE — Telephone Encounter (Signed)
Patient returned PT phone call and reports she had tried to call to cancel today's appointment but was not able to get ahold of anyone. She states that she is doing better and her MD told her she does not need to come to PT anymore, she requests to be discharged.  Deniece Ree PT, DPT [phone]

## 2016-09-16 NOTE — Telephone Encounter (Signed)
Patient had her 2nd consecutive no-show today. Called and left message detailing no-show as well as clinic no-show/DC policy. Left clinic number to call back for questions and/or scheduling.  Deniece Ree PT, DPT [phone]

## 2016-09-27 ENCOUNTER — Emergency Department (HOSPITAL_COMMUNITY)
Admission: EM | Admit: 2016-09-27 | Discharge: 2016-09-27 | Disposition: A | Discharge status: Home / Self Care | Payer: Self-pay | Attending: Emergency Medicine | Admitting: Emergency Medicine

## 2016-09-27 ENCOUNTER — Encounter (HOSPITAL_COMMUNITY): Payer: Self-pay | Admitting: Emergency Medicine

## 2016-09-27 DIAGNOSIS — I10 Essential (primary) hypertension: Secondary | ICD-10-CM | POA: Insufficient documentation

## 2016-09-27 DIAGNOSIS — F17290 Nicotine dependence, other tobacco product, uncomplicated: Secondary | ICD-10-CM | POA: Insufficient documentation

## 2016-09-27 DIAGNOSIS — N390 Urinary tract infection, site not specified: Secondary | ICD-10-CM | POA: Insufficient documentation

## 2016-09-27 DIAGNOSIS — F17210 Nicotine dependence, cigarettes, uncomplicated: Secondary | ICD-10-CM | POA: Insufficient documentation

## 2016-09-27 LAB — URINALYSIS, ROUTINE W REFLEX MICROSCOPIC
Bilirubin Urine: NEGATIVE
Glucose, UA: NEGATIVE mg/dL
KETONES UR: NEGATIVE mg/dL
Nitrite: NEGATIVE
PROTEIN: 100 mg/dL — AB
Specific Gravity, Urine: 1.014 (ref 1.005–1.030)
pH: 5 (ref 5.0–8.0)

## 2016-09-27 MED ORDER — DEXTROSE 5 % IV SOLN
1.0000 g | Freq: Once | INTRAVENOUS | Status: AC
  Administered 2016-09-27: 1 g via INTRAVENOUS
  Filled 2016-09-27: qty 10

## 2016-09-27 MED ORDER — CEPHALEXIN 500 MG PO CAPS: 500.0000 mg | ORAL_CAPSULE | Freq: Four times a day (QID) | ORAL | 0 refills | Status: DC

## 2016-09-27 MED ORDER — HYDROCODONE-ACETAMINOPHEN 5-325 MG PO TABS: 1.0000 | ORAL_TABLET | ORAL | 0 refills | Status: DC | PRN

## 2016-09-27 MED ORDER — KETOROLAC TROMETHAMINE 30 MG/ML IJ SOLN
30.0000 mg | Freq: Once | INTRAMUSCULAR | Status: AC
  Administered 2016-09-27: 30 mg via INTRAVENOUS
  Filled 2016-09-27: qty 1

## 2016-09-27 MED ORDER — ONDANSETRON HCL 4 MG/2ML IJ SOLN
4.0000 mg | Freq: Once | INTRAMUSCULAR | Status: AC
  Administered 2016-09-27: 4 mg via INTRAVENOUS
  Filled 2016-09-27: qty 2

## 2016-09-27 MED ORDER — MORPHINE SULFATE (PF) 4 MG/ML IV SOLN
4.0000 mg | Freq: Once | INTRAVENOUS | Status: AC
  Administered 2016-09-27: 4 mg via INTRAVENOUS
  Filled 2016-09-27: qty 1

## 2016-09-27 NOTE — ED Notes (Signed)
Patient given water per PA approval.

## 2016-09-27 NOTE — Discharge Instructions (Signed)
Your examination suggest urinary tract infection. Please increase fluids. Please use Keflex 4 times daily. Use Tylenol or ibuprofen for mild pain, use Norco for more severe pain. Norco may cause drowsiness, please do not drive, operate machinery, trichomoniasis alcohol, or participate in activities requiring concentration when taking this medication. Please have your urine rechecked in 7-10 days by your primary doctor or clinic to ensure infection has resolved.

## 2016-09-27 NOTE — ED Provider Notes (Addendum)
[facility] DEPT Provider Note   CSN: [id number] Arrival date & time: 09/27/16  0901     History   Chief Complaint Chief Complaint  Patient presents with  . Flank Pain    HPI Carol [name] is a 51 y.o. female.   Flank Pain  This is a new problem. The current episode started more than 2 days ago. The problem occurs hourly. The problem has been gradually worsening. Associated symptoms include abdominal pain. Pertinent negatives include no chest pain and no shortness of breath. Associated symptoms comments: Forced vomiting and constipation. Feeling of dehydration. Nothing aggravates the symptoms. Nothing relieves the symptoms. She has tried rest and a cold compress (laxatives. Hydrocodone. ) for the symptoms. The treatment provided no relief.    Past Medical History:  Diagnosis Date  . Back pain   . Bronchitis   . Cervicalgia   . History of blood in urine    microscopic  . History of migraine headaches   . Hypertension   . Impaired fasting glucose     Patient Active Problem List   Diagnosis Date Noted  . 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 ABDOMINAL HYSTERECTOMY  2002  . CHOLECYSTECTOMY    . TUBAL LIGATION      OB History    No data available       Home Medications    Prior to Admission medications   Medication Sig Start Date End Date Taking? Authorizing Provider  HYDROcodone-acetaminophen (NORCO) 7.5-325 MG tablet Take 1 tablet by mouth every 4 (four) hours as needed for moderate pain (Must last 30 days.  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 09/10/16  Yes Sanjuana Kava, MD  albuterol (PROVENTIL HFA;VENTOLIN HFA) 108 (90 Base) MCG/ACT inhaler Inhale 2 puffs into the lungs every 4 (four) hours as needed for wheezing or shortness of breath. Patient not taking: Reported on 09/27/2016 09/09/16   Francine Graven, DO  benzonatate (TESSALON) 100 MG capsule Take 1 capsule (100 mg total)  by mouth 3 (three) times daily as needed for cough. Patient not taking: Reported on 09/27/2016 09/09/16   Francine Graven, DO  cyclobenzaprine (FLEXERIL) 10 MG tablet Take 1 tablet (10 mg total) by mouth 3 (three) times daily as needed. Patient not taking: Reported on 09/09/2016 07/22/16   Tammy Triplett, PA-C  ketorolac (TORADOL) 10 MG tablet Take 1 tablet (10 mg total) by mouth every 8 (eight) hours as needed. Take with food Patient not taking: Reported on 09/27/2016 08/24/16   Tammy Triplett, PA-C  methocarbamol (ROBAXIN) 500 MG tablet Take 1 tablet (500 mg total) by mouth 3 (three) times daily. Patient not taking: Reported on 09/27/2016 08/24/16   Kem Parkinson, PA-C    Family History Family History  Problem Relation Age of Onset  . Arthritis Paternal Grandmother   . Diabetes Paternal Grandmother   . Hypertension Paternal Grandmother   . Stroke Paternal Grandmother   . Diabetes Mother   . Colon cancer Neg Hx     Social History Social History  Substance Use Topics  . Smoking status: Current Every Day Smoker    Packs/day: 0.50    Years: 20.00    Types: Cigarettes, Cigars  . Smokeless tobacco: Never Used     Comment: Tobacco info given  . Alcohol use Yes     Comment: occ.     Allergies   Naftin [naftifine hcl]   Review of Systems Review  of Systems  Constitutional: Negative for activity change.       All ROS Neg except as noted in HPI  HENT: Negative for nosebleeds.   Eyes: Negative for photophobia and discharge.  Respiratory: Negative for cough, shortness of breath and wheezing.   Cardiovascular: Negative for chest pain and palpitations.  Gastrointestinal: Positive for abdominal pain. Negative for blood in stool.  Genitourinary: Positive for dysuria, flank pain and hematuria. Negative for frequency.  Musculoskeletal: Positive for back pain. Negative for arthralgias and neck pain.  Skin: Negative.   Neurological: Negative for dizziness, seizures and speech difficulty.    Psychiatric/Behavioral: Negative for confusion and hallucinations.     Physical Exam Updated Vital Signs BP 145/94 (BP Location: Right Arm)   Pulse (!) 123   Temp 98 F (36.7 C) (Oral)   Resp 20   Ht 5\' 7"  (1.702 m)   Wt 79.8 kg   SpO2 98%   BMI 27.57 kg/m   Physical Exam  Constitutional: She is oriented to person, place, and time. She appears well-developed and well-nourished.  Non-toxic appearance.  HENT:  Head: Normocephalic.  Right Ear: Tympanic membrane and external ear normal.  Left Ear: Tympanic membrane and external ear normal.  Eyes: EOM and lids are normal. Pupils are equal, round, and reactive to light.  Neck: Normal range of motion. Neck supple. Carotid bruit is not present.  Cardiovascular: Regular rhythm, normal heart sounds, intact distal pulses and normal pulses.  Tachycardia present.   Pulmonary/Chest: Breath sounds normal. No respiratory distress.  Abdominal: Soft. Bowel sounds are normal. There is no hepatosplenomegaly. There is tenderness in the suprapubic area. There is CVA tenderness. There is no rigidity and no guarding.  Musculoskeletal: Normal range of motion.  Lymphadenopathy:       Head (right side): No submandibular adenopathy present.       Head (left side): No submandibular adenopathy present.    She has no cervical adenopathy.  Neurological: She is alert and oriented to person, place, and time. She has normal strength. No cranial nerve deficit or sensory deficit.  Skin: Skin is warm and dry.  Psychiatric: She has a normal mood and affect. Her speech is normal.  Nursing note and vitals reviewed.    ED Treatments / Results  Labs (all labs ordered are listed, but only abnormal results are displayed) Labs Reviewed  URINALYSIS, ROUTINE W REFLEX MICROSCOPIC    EKG  EKG Interpretation None       Radiology No results found.  Procedures Procedures (including critical care time)  Medications Ordered in ED Medications - No data to  display   Initial Impression / Assessment and Plan / ED Course  I have reviewed the triage vital signs and the nursing notes.  Pertinent labs & imaging results that were available during my care of the patient were reviewed by me and considered in my medical decision making (see chart for details).     *I have reviewed nursing notes, vital signs, and all appropriate lab and imaging results for this patient.**  Final Clinical Impressions(s) / ED Diagnoses MDM No reported fever or chills. No recent vaginal bleeding. No history of ovarian cyst. No history of kidney stone. No history of injury or trauma to the right flank. Vital signs within normal limits. ED no vomiting noted in the emergency department.  Right flank and CVA tenderness. Vital signs remained stable. Urine analysis shows a cloudy yellow specimen with a specific gravity 1.014. Large hemoglobin. Moderate leukocyte esterase. Too many  to count white blood cells, 100 mg of protein, and white blood cell clumps all present.  Suspect the patient has a urinary tract infection. Patient will be treated with Keflex and Norco. Urine will be sent to the lab for culture. Patient is to return to the emergency department or see the primary physician if not improving. Patient is in agreement with this plan.    Final diagnoses:  None    New Prescriptions Discharge Medication List as of 09/27/2016  1:40 PM    START taking these medications   Details  cephALEXin (KEFLEX) 500 MG capsule Take 1 capsule (500 mg total) by mouth 4 (four) times daily., Starting Sat 09/27/2016, Print    HYDROcodone-acetaminophen (NORCO/VICODIN) 5-325 MG tablet Take 1 tablet by mouth every 4 (four) hours as needed., Starting Sat 09/27/2016, Print         Lily Kocher, PA-C 09/29/16 1859    Nat Christen, MD 09/30/16 0902    Lily Kocher, PA-C 10/06/16 1143    Nat Christen, MD 10/21/16 1346

## 2016-09-27 NOTE — ED Triage Notes (Signed)
Patient c/o right flank pain that radiates into abd. Per patient vomited yesterday. Patient states took laxative yesterday for possible constipation. Per patient BM with no relief. Patient does report dysuria .

## 2016-09-29 LAB — URINE CULTURE: Culture: NO GROWTH

## 2016-10-08 ENCOUNTER — Telehealth: Payer: Self-pay | Admitting: Orthopaedic Surgery

## 2016-10-08 ENCOUNTER — Ambulatory Visit: Payer: BLUE CROSS/BLUE SHIELD | Admitting: Orthopaedic Surgery

## 2016-10-08 NOTE — Telephone Encounter (Signed)
Patient called and stated she had an emergency and wouldn't be able to come in today.  She wanted to reschedule for next week.  Appointment given for Tuesday, 10-14-16 at 10:10

## 2016-10-14 ENCOUNTER — Encounter: Payer: Self-pay | Admitting: Orthopaedic Surgery

## 2016-10-14 ENCOUNTER — Ambulatory Visit: Payer: Self-pay | Admitting: Orthopaedic Surgery

## 2017-04-03 ENCOUNTER — Encounter (HOSPITAL_COMMUNITY): Payer: Self-pay | Admitting: *Deleted

## 2017-04-03 ENCOUNTER — Emergency Department (HOSPITAL_COMMUNITY): Payer: Self-pay

## 2017-04-03 ENCOUNTER — Emergency Department (HOSPITAL_COMMUNITY)
Admission: EM | Admit: 2017-04-03 | Discharge: 2017-04-03 | Disposition: A | Discharge status: Home / Self Care | Payer: Self-pay | Attending: Emergency Medicine | Admitting: Emergency Medicine

## 2017-04-03 DIAGNOSIS — N12 Tubulo-interstitial nephritis, not specified as acute or chronic: Secondary | ICD-10-CM

## 2017-04-03 DIAGNOSIS — R109 Unspecified abdominal pain: Secondary | ICD-10-CM | POA: Insufficient documentation

## 2017-04-03 DIAGNOSIS — Z9049 Acquired absence of other specified parts of digestive tract: Secondary | ICD-10-CM | POA: Insufficient documentation

## 2017-04-03 DIAGNOSIS — N179 Acute kidney failure, unspecified: Secondary | ICD-10-CM | POA: Insufficient documentation

## 2017-04-03 DIAGNOSIS — Z79899 Other long term (current) drug therapy: Secondary | ICD-10-CM | POA: Insufficient documentation

## 2017-04-03 LAB — CBC WITH DIFFERENTIAL/PLATELET
BASOS PCT: 0 %
Basophils Absolute: 0 10*3/uL (ref 0.0–0.1)
EOS ABS: 0 10*3/uL (ref 0.0–0.7)
EOS PCT: 0 %
HCT: 40.6 % (ref 36.0–46.0)
Hemoglobin: 14.4 g/dL (ref 12.0–15.0)
LYMPHS ABS: 2.4 10*3/uL (ref 0.7–4.0)
Lymphocytes Relative: 23 %
MCH: 29.3 pg (ref 26.0–34.0)
MCHC: 35.5 g/dL (ref 30.0–36.0)
MCV: 82.7 fL (ref 78.0–100.0)
Monocytes Absolute: 0.7 10*3/uL (ref 0.1–1.0)
Monocytes Relative: 6 %
Neutro Abs: 7.4 10*3/uL (ref 1.7–7.7)
Neutrophils Relative %: 71 %
PLATELETS: 299 10*3/uL (ref 150–400)
RBC: 4.91 MIL/uL (ref 3.87–5.11)
RDW: 12.8 % (ref 11.5–15.5)
WBC: 10.6 10*3/uL — AB (ref 4.0–10.5)

## 2017-04-03 LAB — URINALYSIS, ROUTINE W REFLEX MICROSCOPIC
BILIRUBIN URINE: NEGATIVE
Glucose, UA: >=500 mg/dL — AB
KETONES UR: NEGATIVE mg/dL
NITRITE: POSITIVE — AB
PH: 5 (ref 5.0–8.0)
Protein, ur: NEGATIVE mg/dL
SPECIFIC GRAVITY, URINE: 1.014 (ref 1.005–1.030)

## 2017-04-03 LAB — COMPREHENSIVE METABOLIC PANEL
ALT: 18 U/L (ref 14–54)
AST: 18 U/L (ref 15–41)
Albumin: 4.2 g/dL (ref 3.5–5.0)
Alkaline Phosphatase: 114 U/L (ref 38–126)
Anion gap: 11 (ref 5–15)
BUN: 11 mg/dL (ref 6–20)
CHLORIDE: 106 mmol/L (ref 101–111)
CO2: 23 mmol/L (ref 22–32)
CREATININE: 0.79 mg/dL (ref 0.44–1.00)
Calcium: 9.1 mg/dL (ref 8.9–10.3)
GFR calc Af Amer: >60 mL/min (ref >60)
Glucose, Bld: 230 mg/dL — ABNORMAL HIGH (ref 65–99)
Potassium: 3.2 mmol/L — ABNORMAL LOW (ref 3.5–5.1)
Sodium: 140 mmol/L (ref 135–145)
Total Bilirubin: 0.4 mg/dL (ref 0.3–1.2)
Total Protein: 8.2 g/dL — ABNORMAL HIGH (ref 6.5–8.1)

## 2017-04-03 MED ORDER — MORPHINE SULFATE (PF) 4 MG/ML IV SOLN
4.0000 mg | Freq: Once | INTRAVENOUS | Status: AC
Start: 2017-04-03 — End: 2017-04-03
  Administered 2017-04-03: 4 mg via INTRAVENOUS
  Filled 2017-04-03: qty 1

## 2017-04-03 MED ORDER — DEXTROSE 5 % IV SOLN
1.0000 g | Freq: Once | INTRAVENOUS | Status: AC
  Administered 2017-04-03: 1 g via INTRAVENOUS
  Filled 2017-04-03: qty 10

## 2017-04-03 MED ORDER — MORPHINE SULFATE (PF) 2 MG/ML IV SOLN
2.0000 mg | Freq: Once | INTRAVENOUS | Status: AC
  Administered 2017-04-03: 2 mg via INTRAVENOUS
  Filled 2017-04-03: qty 1

## 2017-04-03 MED ORDER — KETOROLAC TROMETHAMINE 30 MG/ML IJ SOLN
30.0000 mg | Freq: Once | INTRAMUSCULAR | Status: AC
  Administered 2017-04-03: 30 mg via INTRAVENOUS
  Filled 2017-04-03: qty 1

## 2017-04-03 MED ORDER — SODIUM CHLORIDE 0.9 % IV BOLUS (SEPSIS)
1000.0000 mL | Freq: Once | INTRAVENOUS | Status: AC
  Administered 2017-04-03: 1000 mL via INTRAVENOUS

## 2017-04-03 MED ORDER — OXYCODONE-ACETAMINOPHEN 5-325 MG PO TABS
2.0000 | ORAL_TABLET | Freq: Once | ORAL | Status: AC
  Administered 2017-04-03: 2 via ORAL
  Filled 2017-04-03: qty 2

## 2017-04-03 MED ORDER — CEPHALEXIN 500 MG PO CAPS: 500.0000 mg | ORAL_CAPSULE | Freq: Three times a day (TID) | ORAL | 0 refills | Status: DC

## 2017-04-03 NOTE — ED Triage Notes (Signed)
Right flank pain with dysuria

## 2017-04-03 NOTE — Discharge Instructions (Signed)
Use ibuprofen and tylenol as needed for pain.  Return if you are worse at any time, especially unable to tolerate fluids (vomiting), worsening fever, or worsening pain.

## 2017-04-03 NOTE — ED Provider Notes (Signed)
[facility] DEPT Provider Note   CSN: [id number] Arrival date & time: 04/03/17  1320     History   Chief Complaint Chief Complaint  Patient presents with  . Flank Pain    HPI Carol [name] is a 51 y.o. female.  HPI  51 year old female complaining of right flank pain that began 2 days ago. She does not note any taking factors. She states that on Wednesday she did have some loose bowel movements. She did not eat solid food that day due to the loose stooling. She began having pain on the right side after that. Just take some over-the-counter pain medication and was able to sleep. The pain resumed this morning. It is worse with movement. She denies frequency of urination, pain with urination, fever, chills, nausea, vomiting, or abnormal vaginal discharge. She is status post hysterectomy. She also thinks that she has had her gallbladder out. She states the pain worsens with movement and deep breathing. She is otherwise not dyspneic and denies chest pain or cough.pain is c pain is currently  7 out of 10  Past Medical History:  Diagnosis Date  . Back pain   . Bronchitis   . Cervicalgia   . History of blood in urine    microscopic  . History of migraine headaches   . Impaired fasting glucose     Patient Active Problem List   Diagnosis Date Noted  . 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 ABDOMINAL HYSTERECTOMY  2002  . CHOLECYSTECTOMY    . TUBAL LIGATION      OB History    No data available       Home Medications    Prior to Admission medications   Medication Sig Start Date End Date Taking? Authorizing Provider  OVER THE COUNTER MEDICATION Take 1 tablet by mouth daily as needed.   Yes [provider]    Family History Family History  Problem Relation Age of Onset  . Arthritis Paternal Grandmother   . Diabetes Paternal Grandmother   . Hypertension Paternal Grandmother   . Stroke Paternal Grandmother   .  Diabetes Mother   . Colon cancer Neg Hx     Social History Social History  Substance Use Topics  . Smoking status: Current Every Day Smoker    Packs/day: 0.50    Years: 20.00    Types: Cigarettes, Cigars  . Smokeless tobacco: Never Used     Comment: Tobacco info given  . Alcohol use Yes     Comment: occ.     Allergies   Naftin [naftifine hcl]   Review of Systems Review of Systems  All other systems reviewed and are negative.    Physical Exam Updated Vital Signs BP 122/76   Pulse (!) 114   Temp 99.1 F (37.3 C) (Oral)   Resp (!) 24   Ht 1.74 m (5' 8.5")   Wt 81.6 kg (180 lb)   SpO2 99%   BMI 26.97 kg/m   Physical Exam  Constitutional: She is oriented to person, place, and time. She appears well-developed and well-nourished. No distress.  HENT:  Head: Normocephalic and atraumatic.  Right Ear: External ear normal.  Left Ear: External ear normal.  Nose: Nose normal.  Eyes: Pupils are equal, round, and reactive to light. Conjunctivae and EOM are normal.  Neck: Normal range of motion. Neck supple.  Cardiovascular: Normal rate and regular rhythm.   Pulmonary/Chest: Effort normal and breath sounds  normal.  Abdominal: Soft. Bowel sounds are normal.  Musculoskeletal: Normal range of motion.  Neurological: She is alert and oriented to person, place, and time. She exhibits normal muscle tone. Coordination normal.  Skin: Skin is warm and dry.  Psychiatric: She has a normal mood and affect. Her behavior is normal. Thought content normal.  Nursing note and vitals reviewed.    ED Treatments / Results  Labs (all labs ordered are listed, but only abnormal results are displayed) Labs Reviewed  URINALYSIS, ROUTINE W REFLEX MICROSCOPIC - Abnormal; Notable for the following:       Result Value   APPearance HAZY (*)    Glucose, UA >=500 (*)    Hgb urine dipstick MODERATE (*)    Nitrite POSITIVE (*)    Leukocytes, UA TRACE (*)    Bacteria, UA MANY (*)    Squamous  Epithelial / LPF 6-30 (*)    All other components within normal limits  CBC WITH DIFFERENTIAL/PLATELET  COMPREHENSIVE METABOLIC PANEL    EKG  EKG Interpretation None       Radiology Ct Renal Stone Study  Result Date: 04/03/2017 CLINICAL DATA:  51 year old female with right flank pain and dysuria for 3 days. EXAM: CT ABDOMEN AND PELVIS WITHOUT CONTRAST TECHNIQUE: Multidetector CT imaging of the abdomen and pelvis was performed following the standard protocol without IV contrast. COMPARISON:  09/09/2016 FINDINGS: Lower chest: No acute abnormality. Hepatobiliary: Diffuse enlargement hypodensity of the liver is consistent with steatosis. No focal liver abnormality is seen. The patient is status post cholecystectomy. No biliary dilatation. Pancreas: Unremarkable. No pancreatic ductal dilatation or surrounding inflammatory changes. Spleen: Normal in size without focal abnormality. Adrenals/Urinary Tract: Adrenal glands are unremarkable. Very subtle fat stranding is noted at the lower pole of the right kidney. The left kidney is unremarkable. There are no renal calculi, focal lesion, or hydronephrosis. Bladder is unremarkable. Stomach/Bowel: Stomach is within normal limits. Appendix appears normal. No evidence of bowel wall thickening, distention, or inflammatory changes. Vascular/Lymphatic: No significant vascular findings are present. No enlarged abdominal or pelvic lymph nodes. Reproductive: Status post hysterectomy. No adnexal masses. Other: No abdominal wall hernia or abnormality. No abdominopelvic ascites. Musculoskeletal: No acute or significant osseous findings. IMPRESSION: 1. Subtle fat stranding at the lower pole the right kidney could represent early pyelonephritis. No evidence for hydronephrosis or nephroureterolithiasis. 2. Hepatic steatosis. 3. Status post cholecystectomy and hysterectomy. These results will be called to the ordering clinician or representative by the Radiology Department at  the imaging location. Electronically Signed   By: Kristopher Oppenheim M.D.   On: 04/03/2017 16:41    Procedures Procedures (including critical care time)  Medications Ordered in ED Medications  sodium chloride 0.9 % bolus 1,000 mL (1,000 mLs Intravenous New Bag/Given 04/03/17 1558)  morphine 2 MG/ML injection 2 mg (2 mg Intravenous Given 04/03/17 1558)     Initial Impression / Assessment and Plan / ED Course  I have reviewed the triage vital signs and the nursing notes.  Pertinent labs & imaging results that were available during my care of the patient were reviewed by me and considered in my medical decision making (see chart for details).    5:19 PM Patient resting and states feels improved. IV Rocephin infusing.   6:48 PM Patient remains hemodynamically stable.  Rocephin infused.  Plan discharge on keflex.  Discussed return precautions and need for follow up.  Exam remains with abdomen soft, some right flank ttp.  Good movement and pulses bilateral lower extremties  Final Clinical Impressions(s) / ED Diagnoses   Final diagnoses:  Right flank pain  Pyelonephritis    New Prescriptions New Prescriptions   CEPHALEXIN (KEFLEX) 500 MG CAPSULE    Take 1 capsule (500 mg total) by mouth 3 (three) times daily.     Pattricia Boss, MD 04/03/17 [phone]

## 2017-04-07 LAB — URINE CULTURE

## 2017-04-08 ENCOUNTER — Telehealth: Payer: Self-pay | Admitting: Emergency Medicine

## 2017-04-08 NOTE — Telephone Encounter (Signed)
Post ED Visit - Positive Culture Follow-up  Culture report reviewed by antimicrobial stewardship pharmacist:  []  Elenor Quinones, Pharm.D. []  Heide Guile, Pharm.D., BCPS AQ-ID []  Parks Neptune, Pharm.D., BCPS []  Alycia Rossetti, Pharm.D., BCPS []  [place], [place].D., BCPS, AAHIVP []  Legrand Como, Pharm.D., BCPS, AAHIVP []  Salome Arnt, PharmD, BCPS []  Dimitri Ped, PharmD, BCPS []  Vincenza Hews, PharmD, BCPS Nida Boatman PharmD  Positive urine culture Treated with cephalexin, organism sensitive to the same and no further patient follow-up is required at this time.  Hazle Nordmann 04/08/2017, 2:06 PM

## 2017-04-14 ENCOUNTER — Encounter (HOSPITAL_COMMUNITY): Payer: Self-pay | Admitting: Physical Therapy

## 2017-04-14 NOTE — Therapy (Signed)
[place], [place], [address] Phone: [phone]   Fax:  [phone]  Patient Details  Name: Carol [name] MRN: [id number] Date of Birth: 12-Apr-1966 Referring Provider:  No ref. provider found  Encounter Date: 04/14/2017   PHYSICAL THERAPY DISCHARGE SUMMARY  Visits from Start of Care: 4  Current functional level related to goals / functional outcomes: Patient has not returned since last skilled session; DC per policy.   Remaining deficits: Unable to assess    Education / Equipment: N/A  Plan: Patient agrees to discharge.  Patient goals were not met. Patient is being discharged due to not returning since the last visit.  ?????        Deniece Ree PT, DPT [facility] [address] [place], [place], [address] Phone: [phone]   Fax:  [phone]

## 2017-05-14 ENCOUNTER — Emergency Department (HOSPITAL_COMMUNITY): Payer: Self-pay

## 2017-05-14 ENCOUNTER — Encounter (HOSPITAL_COMMUNITY): Payer: Self-pay | Admitting: *Deleted

## 2017-05-14 ENCOUNTER — Emergency Department (HOSPITAL_COMMUNITY)
Admission: EM | Admit: 2017-05-14 | Discharge: 2017-05-14 | Disposition: A | Discharge status: Home / Self Care | Payer: Self-pay | Attending: Emergency Medicine | Admitting: Emergency Medicine

## 2017-05-14 DIAGNOSIS — M79674 Pain in right toe(s): Secondary | ICD-10-CM | POA: Insufficient documentation

## 2017-05-14 DIAGNOSIS — Z9049 Acquired absence of other specified parts of digestive tract: Secondary | ICD-10-CM | POA: Insufficient documentation

## 2017-05-14 DIAGNOSIS — F17210 Nicotine dependence, cigarettes, uncomplicated: Secondary | ICD-10-CM | POA: Insufficient documentation

## 2017-05-14 MED ORDER — TRAMADOL HCL 50 MG PO TABS: 50.0000 mg | ORAL_TABLET | Freq: Four times a day (QID) | ORAL | 0 refills | Status: DC | PRN

## 2017-05-14 MED ORDER — CLOTRIMAZOLE 1 % EX CREA: TOPICAL_CREAM | CUTANEOUS | 0 refills | Status: DC

## 2017-05-14 NOTE — ED Notes (Signed)
Pt alert & oriented x4, stable gait. Patient given discharge instructions, paperwork & prescription(s). Patient  instructed to stop at the registration desk to finish any additional paperwork. Patient verbalized understanding. Pt left department w/ no further questions. 

## 2017-05-14 NOTE — ED Triage Notes (Signed)
Pain in right little toe, states it throbs and she is unable to sleep, states she has had problems for quite a while

## 2017-05-14 NOTE — Discharge Instructions (Signed)
Try to keep your feet dry as possible.  Call one of the podiatrists listed to arrange a follow-up appt

## 2017-05-16 NOTE — ED Provider Notes (Signed)
[facility] DEPT Provider Note   CSN: [id number] Arrival date & time: 05/14/17  1413     History   Chief Complaint Chief Complaint  Patient presents with  . Toe Pain    HPI Carol [name] is a 51 y.o. female.  HPI  Carol [name] is a 51 y.o. female who presents to the Emergency Department complaining of right fifth toe pain. Pain is constant and she describes as throbbing and stinging, worse with weight bearing.  Symptoms have been present for months.  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  States that she repeatedly files the hardened skin from her toe.  Denies injury, open wounds to the toes or feet.  No discoloration, swelling or nail symptoms.  Past Medical History:  Diagnosis Date  . Back pain   . Bronchitis   . Cervicalgia   . History of blood in urine    microscopic  . History of migraine headaches   . Impaired fasting glucose     Patient Active Problem List   Diagnosis Date Noted  . 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 ABDOMINAL HYSTERECTOMY  2002  . CHOLECYSTECTOMY    . TUBAL LIGATION      OB History    No data available       Home Medications    Prior to Admission medications   Medication Sig Start Date End Date Taking? Authorizing Provider  cephALEXin (KEFLEX) 500 MG capsule Take 1 capsule (500 mg total) by mouth 3 (three) times daily. 04/03/17   Pattricia Boss, MD  clotrimazole (LOTRIMIN) 1 % cream Apply to affected area 2 times daily 05/14/17   Vercie Pokorny, PA-C  OVER THE COUNTER MEDICATION Take 1 tablet by mouth daily as needed.    [provider]  traMADol (ULTRAM) 50 MG tablet Take 1 tablet (50 mg total) by mouth every 6 (six) hours as needed. 05/14/17   Kem Parkinson, PA-C    Family History Family History  Problem Relation Age of Onset  . Arthritis Paternal Grandmother   . Diabetes Paternal  Grandmother   . Hypertension Paternal Grandmother   . Stroke Paternal Grandmother   . Diabetes Mother   . Colon cancer Neg Hx     Social History Social History  Substance Use Topics  . Smoking status: Current Every Day Smoker    Packs/day: 0.50    Years: 20.00    Types: Cigarettes, Cigars  . Smokeless tobacco: Never Used     Comment: Tobacco info given  . Alcohol use Yes     Comment: occ.     Allergies   Naftin [naftifine hcl]   Review of Systems Review of Systems  Constitutional: Negative for chills and fever.  Musculoskeletal: Positive for arthralgias (pain right fifth toe). Negative for joint swelling.  Skin: Negative for color change and wound.  Neurological: Negative for weakness and numbness.  All other systems reviewed and are negative.    Physical Exam Updated Vital Signs BP 139/70   Pulse 95   Temp 98.2 F (36.8 C) (Oral)   Resp 20   Ht 5\' 7"  (1.702 m)   Wt 81.6 kg (180 lb)   SpO2 99%   BMI 28.19 kg/m   Physical Exam  Constitutional: She is oriented to person, place, and time. She appears well-developed and well-nourished. No distress.  HENT:  Head: Normocephalic and atraumatic.  Cardiovascular: Normal rate, regular rhythm and intact distal pulses.   Pulmonary/Chest: Effort normal and breath sounds normal.  Musculoskeletal: She exhibits tenderness. She exhibits no edema or deformity.  Focal tenderness of the right fifth toe.  Skin is callused.  No discoloration, no edema.    Neurological: She is alert and oriented to person, place, and time. No sensory deficit. She exhibits normal muscle tone. Coordination normal.  Skin: Skin is warm and dry. Capillary refill takes less than 2 seconds.  Mild erythema of the web spaces of the right toes.   Nursing note and vitals reviewed.    ED Treatments / Results  Labs (all labs ordered are listed, but only abnormal results are displayed) Labs Reviewed - No data to display  EKG  EKG  Interpretation None       Radiology Dg Toe 5th Right  Result Date: 05/14/2017 CLINICAL DATA:  Chronic right fifth toe pain without known injury. EXAM: RIGHT FIFTH TOE COMPARISON:  None. FINDINGS: There is no evidence of fracture or dislocation. There is possible fusion of the fifth middle and distal phalanges. Soft tissues are unremarkable. 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 No acute abnormality is seen the right fifth toe. Electronically Signed   By: Marijo Conception, M.D.   On: 05/14/2017 15:43     Procedures Procedures (including critical care time)  Medications Ordered in ED Medications - No data to display   Initial Impression / Assessment and Plan / ED Course  I have reviewed the triage vital signs and the nursing notes.  Pertinent labs & imaging results that were available during my care of the patient were reviewed by me and considered in my medical decision making (see chart for details).     Chronic pain to toe.  No concerning sx's for vascular deficits.  No open wounds or edema. Possible tinea Pt agrees to podiatry f/u  Final Clinical Impressions(s) / ED Diagnoses   Final diagnoses:  Toe pain, right    New Prescriptions Discharge Medication List as of 05/14/2017  4:10 PM    START taking these medications   Details  clotrimazole (LOTRIMIN) 1 % cream Apply to affected area 2 times daily, Print    traMADol (ULTRAM) 50 MG tablet Take 1 tablet (50 mg total) by mouth every 6 (six) hours as needed., Starting Thu 05/14/2017, Print         [place], [place], PA-C 05/16/17 2253    Virgel Manifold, MD 06/01/17 1141

## 2017-06-10 ENCOUNTER — Emergency Department (HOSPITAL_COMMUNITY)
Admission: EM | Admit: 2017-06-10 | Discharge: 2017-06-10 | Disposition: A | Discharge status: Home / Self Care | Payer: Self-pay | Attending: Emergency Medicine | Admitting: Emergency Medicine

## 2017-06-10 ENCOUNTER — Encounter (HOSPITAL_COMMUNITY): Payer: Self-pay | Admitting: Cardiology

## 2017-06-10 DIAGNOSIS — F17210 Nicotine dependence, cigarettes, uncomplicated: Secondary | ICD-10-CM | POA: Insufficient documentation

## 2017-06-10 DIAGNOSIS — Z79899 Other long term (current) drug therapy: Secondary | ICD-10-CM | POA: Insufficient documentation

## 2017-06-10 DIAGNOSIS — L02411 Cutaneous abscess of right axilla: Secondary | ICD-10-CM | POA: Insufficient documentation

## 2017-06-10 MED ORDER — TRAMADOL HCL 50 MG PO TABS: 50.0000 mg | ORAL_TABLET | Freq: Four times a day (QID) | ORAL | 0 refills | Status: DC | PRN

## 2017-06-10 MED ORDER — SULFAMETHOXAZOLE-TRIMETHOPRIM 800-160 MG PO TABS: 1.0000 | ORAL_TABLET | Freq: Two times a day (BID) | ORAL | 0 refills | Status: AC

## 2017-06-10 NOTE — ED Triage Notes (Signed)
Boil under right arm since Saturday

## 2017-06-10 NOTE — ED Provider Notes (Signed)
[facility] EMERGENCY DEPARTMENT Provider Note   CSN: [id number] Arrival date & time: 06/10/17  1408     History   Chief Complaint Chief Complaint  Patient presents with  . Abscess    HPI Carol [name] is a 51 y.o. female.  Patient is a 51 year old female who presents to the emergency department with a complaint of an abscess in the right under arm area.  The patient states she's noticed an abscess in her right under arm area since October 20. She states that is getting more and more sore. She feels like it is getting larger and spreading. She's not noticed drainage from it. She's not noted any red streaks there. She's not had fever or chills to be reported. She presents now for assistance with this issue.      Past Medical History:  Diagnosis Date  . Back pain   . Bronchitis   . Cervicalgia   . History of blood in urine    microscopic  . History of migraine headaches   . Impaired fasting glucose     Patient Active Problem List   Diagnosis Date Noted  . 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 ABDOMINAL HYSTERECTOMY  2002  . CHOLECYSTECTOMY    . TUBAL LIGATION      OB History    No data available       Home Medications    Prior to Admission medications   Medication Sig Start Date End Date Taking? Authorizing Provider  cephALEXin (KEFLEX) 500 MG capsule Take 1 capsule (500 mg total) by mouth 3 (three) times daily. 04/03/17   Pattricia Boss, MD  clotrimazole (LOTRIMIN) 1 % cream Apply to affected area 2 times daily 05/14/17   [place], Tammy, PA-C  OVER THE COUNTER MEDICATION Take 1 tablet by mouth daily as needed.    [provider]  traMADol (ULTRAM) 50 MG tablet Take 1 tablet (50 mg total) by mouth every 6 (six) hours as needed. 05/14/17   Kem Parkinson, PA-C    Family History Family History  Problem Relation Age of Onset  . Arthritis Paternal Grandmother   . Diabetes Paternal Grandmother     . Hypertension Paternal Grandmother   . Stroke Paternal Grandmother   . Diabetes Mother   . Colon cancer Neg Hx     Social History Social History  Substance Use Topics  . Smoking status: Current Every Day Smoker    Packs/day: 0.50    Years: 20.00    Types: Cigarettes, Cigars  . Smokeless tobacco: Never Used     Comment: Tobacco info given  . Alcohol use Yes     Comment: occ.     Allergies   Naftin [naftifine hcl]   Review of Systems Review of Systems  Constitutional: Negative for activity change.       All ROS Neg except as noted in HPI  HENT: Negative for nosebleeds.   Eyes: Negative for photophobia and discharge.  Respiratory: Negative for cough, shortness of breath and wheezing.   Cardiovascular: Negative for chest pain and palpitations.  Gastrointestinal: Negative for abdominal pain and blood in stool.  Genitourinary: Negative for dysuria, frequency and hematuria.  Musculoskeletal: Negative for arthralgias, back pain and neck pain.  Skin: Negative.        abscess  Neurological: Negative for dizziness, seizures and speech difficulty.  Psychiatric/Behavioral: Negative for confusion and hallucinations.     Physical Exam Updated Vital Signs BP  126/72 (BP Location: Left Arm)   Pulse 86   Temp 97.6 F (36.4 C) (Oral)   Resp 16   Ht 5' 7.5" (1.715 m)   Wt 83.9 kg (185 lb)   SpO2 99%   BMI 28.55 kg/m   Physical Exam  Constitutional: She is oriented to person, place, and time. She appears well-developed and well-nourished.  Non-toxic appearance.  HENT:  Head: Normocephalic.  Right Ear: Tympanic membrane and external ear normal.  Left Ear: Tympanic membrane and external ear normal.  Eyes: Pupils are equal, round, and reactive to light. EOM and lids are normal.  Neck: Normal range of motion. Neck supple. Carotid bruit is not present.  Cardiovascular: Normal rate, regular rhythm, normal heart sounds, intact distal pulses and normal pulses.   Pulmonary/Chest:  Breath sounds normal. No respiratory distress.  Abdominal: Soft. Bowel sounds are normal. There is no tenderness. There is no guarding.  Musculoskeletal: Normal range of motion.  There is a small abscess in the right axilla. There is no red streaking or drainage appreciated. The area is sore to touch. The patient has full range of motion of the right shoulder without problem.  Lymphadenopathy:       Head (right side): No submandibular adenopathy present.       Head (left side): No submandibular adenopathy present.    She has no cervical adenopathy.  Neurological: She is alert and oriented to person, place, and time. She has normal strength. No cranial nerve deficit or sensory deficit.  Skin: Skin is warm and dry.  Psychiatric: She has a normal mood and affect. Her speech is normal.  Nursing note and vitals reviewed.    ED Treatments / Results  Labs (all labs ordered are listed, but only abnormal results are displayed) Labs Reviewed - No data to display  EKG  EKG Interpretation None       Radiology No results found.  Procedures Procedures (including critical care time)  Medications Ordered in ED Medications - No data to display   Initial Impression / Assessment and Plan / ED Course  I have reviewed the triage vital signs and the nursing notes.  Pertinent labs & imaging results that were available during my care of the patient were reviewed by me and considered in my medical decision making (see chart for details).       Final Clinical Impressions(s) / ED Diagnoses MDM Vital signs within normal limits. Patient is an abscess of the right axilla. It is not a candidate for incision and drainage at this time. Is no evidence of cellulitis at this time. The patient will be treated with Bactrim. Patient will use warm soaks over the next 7-10 days. Patient is to see her primary physician or return to the emergency department if these methods are not effective.   Final  diagnoses:  Abscess of axilla, right    New Prescriptions New Prescriptions   SULFAMETHOXAZOLE-TRIMETHOPRIM (BACTRIM DS,SEPTRA DS) 800-160 MG TABLET    Take 1 tablet by mouth 2 (two) times daily.   TRAMADOL (ULTRAM) 50 MG TABLET    Take 1 tablet (50 mg total) by mouth every 6 (six) hours as needed.     Lily Kocher, PA-C 06/10/17 1525    Milton Ferguson, MD 06/10/17 [phone]

## 2017-06-10 NOTE — Discharge Instructions (Signed)
Please soak the right under arm area and warm Epsom salt water for 10-15 minutes daily. Please refrain from shaving or using deodorant on the right over the next 4 or 5 days. Use Bactrim 2 times daily with food. Use ibuprofen every 6 hours as needed for pain. May use Ultram for more severe pain. Ultram may cause drowsiness, please do not drive, drink alcohol, operate machinery, or participated in activities requiring concentration when taking this medication.

## 2017-10-09 ENCOUNTER — Encounter: Payer: Self-pay | Admitting: Gastroenterology

## 2018-01-05 IMAGING — NM NM PULMONARY VENT & PERF
15 series · 15 of 15 positions shown · non-contrast
Comparison: Chest radiograph performed earlier today at [DATE] p.m.

CLINICAL DATA: Subacute onset of shortness of breath. Syncope.
Initial encounter.

EXAM:
NUCLEAR MEDICINE VENTILATION - PERFUSION LUNG SCAN
TECHNIQUE: Ventilation images were obtained in multiple projections using
inhaled aerosol 1c-SSm DTPA. Perfusion images were obtained in
multiple projections after intravenous injection of 1c-SSm MAA.
RADIOPHARMACEUTICALS:  30.6 mCi Dechnetium-33m DTPA aerosol
inhalation and 4.1 mCi Dechnetium-33m MAA IV

[Series 1: ant/post vent · 4.14mm/px · 1 of 1 slices shown]
[im 1/1]
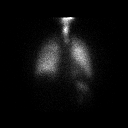

[Series 2: lao/rpo vent · 4.14mm/px · 1 of 1 slices shown (1 of 2)]
[im 1/1]
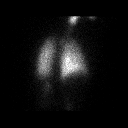

[Series 2: lao/rpo vent · 4.14mm/px · 1 of 1 slices shown (2 of 2)]
[im 1/1]
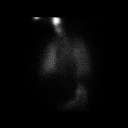

[Series 3: lt lat/rt lat vent · 4.14mm/px · 1 of 1 slices shown (1 of 2)]
[im 1/1]
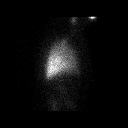

[Series 3: lt lat/rt lat vent · 4.14mm/px · 1 of 1 slices shown (2 of 2)]
[im 1/1]
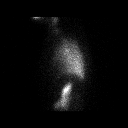

[Series 4: lpo/rao vent · 4.14mm/px · 1 of 1 slices shown (1 of 2)]
[im 1/1]
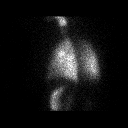

[Series 4: lpo/rao vent · 4.14mm/px · 1 of 1 slices shown (2 of 2)]
[im 1/1]
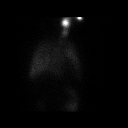

[Series 5: ant/post perf · 4.14mm/px · 1 of 1 slices shown (1 of 2)]
[im 1/1]
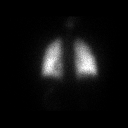

[Series 5: ant/post perf · 4.14mm/px · 1 of 1 slices shown (2 of 2)]
[im 1/1]
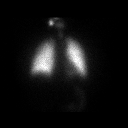

[Series 6: lao/rpo perf · 4.14mm/px · 1 of 1 slices shown (1 of 2)]
[im 1/1]
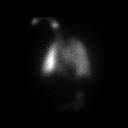

[Series 6: lao/rpo perf · 4.14mm/px · 1 of 1 slices shown (2 of 2)]
[im 1/1]
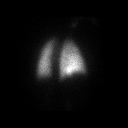

[Series 7: lt lat/rt lat perf · 4.14mm/px · 1 of 1 slices shown (1 of 2)]
[im 1/1]
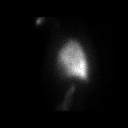

[Series 7: lt lat/rt lat perf · 4.14mm/px · 1 of 1 slices shown (2 of 2)]
[im 1/1]
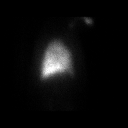

[Series 8: lpo/rao perf · 4.14mm/px · 1 of 1 slices shown (1 of 2)]
[im 1/1]
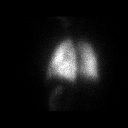

[Series 8: lpo/rao perf · 4.14mm/px · 1 of 1 slices shown (2 of 2)]
[im 1/1]
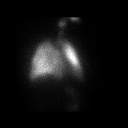

[15 of 15 positions shown; findings below may reference images not displayed]

FINDINGS: Ventilation: No focal ventilation defect. Visualized ventilation is
grossly normal bilaterally.

Perfusion: No wedge shaped peripheral perfusion defects to suggest
acute pulmonary embolism.
IMPRESSION: Normal V/Q scan.  No evidence of pulmonary embolus.

## 2018-01-30 ENCOUNTER — Emergency Department (HOSPITAL_COMMUNITY)
Admission: EM | Admit: 2018-01-30 | Discharge: 2018-01-30 | Disposition: A | Discharge status: Home / Self Care | Payer: Self-pay | Attending: Emergency Medicine | Admitting: Emergency Medicine

## 2018-01-30 ENCOUNTER — Other Ambulatory Visit: Payer: Self-pay

## 2018-01-30 ENCOUNTER — Encounter (HOSPITAL_COMMUNITY): Payer: Self-pay

## 2018-01-30 ENCOUNTER — Emergency Department (HOSPITAL_COMMUNITY): Payer: Self-pay

## 2018-01-30 DIAGNOSIS — R112 Nausea with vomiting, unspecified: Secondary | ICD-10-CM | POA: Insufficient documentation

## 2018-01-30 DIAGNOSIS — F17210 Nicotine dependence, cigarettes, uncomplicated: Secondary | ICD-10-CM | POA: Insufficient documentation

## 2018-01-30 DIAGNOSIS — R197 Diarrhea, unspecified: Secondary | ICD-10-CM | POA: Insufficient documentation

## 2018-01-30 DIAGNOSIS — F17290 Nicotine dependence, other tobacco product, uncomplicated: Secondary | ICD-10-CM | POA: Insufficient documentation

## 2018-01-30 DIAGNOSIS — K859 Acute pancreatitis without necrosis or infection, unspecified: Secondary | ICD-10-CM | POA: Insufficient documentation

## 2018-01-30 LAB — COMPREHENSIVE METABOLIC PANEL
ALK PHOS: 97 U/L (ref 38–126)
ALT: 25 U/L (ref 14–54)
ANION GAP: 8 (ref 5–15)
AST: 20 U/L (ref 15–41)
Albumin: 4.2 g/dL (ref 3.5–5.0)
BILIRUBIN TOTAL: 0.1 mg/dL — AB (ref 0.3–1.2)
BUN: 10 mg/dL (ref 6–20)
CALCIUM: 9 mg/dL (ref 8.9–10.3)
CO2: 24 mmol/L (ref 22–32)
Chloride: 102 mmol/L (ref 101–111)
Creatinine, Ser: 0.57 mg/dL (ref 0.44–1.00)
GFR calc Af Amer: >60 mL/min (ref >60)
GFR calc non Af Amer: >60 mL/min (ref >60)
Glucose, Bld: 313 mg/dL — ABNORMAL HIGH (ref 65–99)
POTASSIUM: 3.4 mmol/L — AB (ref 3.5–5.1)
SODIUM: 134 mmol/L — AB (ref 135–145)
TOTAL PROTEIN: 7.8 g/dL (ref 6.5–8.1)

## 2018-01-30 LAB — CBC
HEMATOCRIT: 39.6 % (ref 36.0–46.0)
Hemoglobin: 13.9 g/dL (ref 12.0–15.0)
MCH: 29.8 pg (ref 26.0–34.0)
MCHC: 35.1 g/dL (ref 30.0–36.0)
MCV: 84.8 fL (ref 78.0–100.0)
PLATELETS: 385 10*3/uL (ref 150–400)
RBC: 4.67 MIL/uL (ref 3.87–5.11)
RDW: 12.5 % (ref 11.5–15.5)
WBC: 9.6 10*3/uL (ref 4.0–10.5)

## 2018-01-30 LAB — URINALYSIS, ROUTINE W REFLEX MICROSCOPIC
Bilirubin Urine: NEGATIVE
Ketones, ur: NEGATIVE mg/dL
Leukocytes, UA: NEGATIVE
Nitrite: NEGATIVE
PH: 7 (ref 5.0–8.0)
Protein, ur: NEGATIVE mg/dL
Specific Gravity, Urine: 1.014 (ref 1.005–1.030)

## 2018-01-30 LAB — LIPASE, BLOOD: Lipase: 113 U/L — ABNORMAL HIGH (ref 11–51)

## 2018-01-30 MED ORDER — IOPAMIDOL (ISOVUE-300) INJECTION 61%
100.0000 mL | Freq: Once | INTRAVENOUS | Status: AC | PRN
  Administered 2018-01-30: 100 mL via INTRAVENOUS

## 2018-01-30 MED ORDER — OXYCODONE HCL 5 MG PO TABS: 5.0000 mg | ORAL_TABLET | Freq: Four times a day (QID) | ORAL | 0 refills | Status: DC | PRN

## 2018-01-30 MED ORDER — SODIUM CHLORIDE 0.9 % IV BOLUS
1000.0000 mL | Freq: Once | INTRAVENOUS | Status: AC
  Administered 2018-01-30: 1000 mL via INTRAVENOUS

## 2018-01-30 MED ORDER — ONDANSETRON HCL 4 MG/2ML IJ SOLN
4.0000 mg | Freq: Once | INTRAMUSCULAR | Status: AC
  Administered 2018-01-30: 4 mg via INTRAVENOUS
  Filled 2018-01-30: qty 2

## 2018-01-30 MED ORDER — ONDANSETRON HCL 4 MG PO TABS: 4.0000 mg | ORAL_TABLET | Freq: Four times a day (QID) | ORAL | 0 refills | Status: DC | PRN

## 2018-01-30 MED ORDER — OXYCODONE HCL 5 MG PO TABS
5.0000 mg | ORAL_TABLET | Freq: Once | ORAL | Status: AC
  Administered 2018-01-30: 5 mg via ORAL
  Filled 2018-01-30: qty 1

## 2018-01-30 MED ORDER — MORPHINE SULFATE (PF) 4 MG/ML IV SOLN
4.0000 mg | Freq: Once | INTRAVENOUS | Status: AC
  Administered 2018-01-30: 4 mg via INTRAVENOUS
  Filled 2018-01-30: qty 1

## 2018-01-30 NOTE — ED Triage Notes (Signed)
Pt has been having bad emesis and diarrhea for the last 2 days. Pt threw up last this morning at 8 am and diarrhea about 12:45. States stomach is burning.

## 2018-01-30 NOTE — ED Notes (Signed)
Patient transported to CT 

## 2018-01-30 NOTE — ED Provider Notes (Signed)
[facility] EMERGENCY DEPARTMENT Provider Note   CSN: [id number] Arrival date & time: 01/30/18  1306     History   Chief Complaint Chief Complaint  Patient presents with  . Emesis  . Diarrhea    HPI QUIDA [name] is a 52 y.o. female.  HPI Patient presents with abdominal pain for the past 2 days.  Has had nausea, vomiting and diarrhea.  Vomiting has improved but continues to have diarrhea.  Abdominal pain is not improved after loose bowel movement.  Stool is non-bloody.  Abdominal pain does not radiate.  No fever or chills.  Denies urinary symptoms.  No known sick contacts. Past Medical History:  Diagnosis Date  . Back pain   . Bronchitis   . Cervicalgia   . History of blood in urine    microscopic  . History of migraine headaches   . Impaired fasting glucose     Patient Active Problem List   Diagnosis Date Noted  . 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 ABDOMINAL HYSTERECTOMY  2002  . CHOLECYSTECTOMY    . TUBAL LIGATION       OB History   None      Home Medications    Prior to Admission medications   Medication Sig Start Date End Date Taking? Authorizing Provider  ibuprofen (ADVIL,MOTRIN) 800 MG tablet Take 800 mg by mouth. Every 6 to 8 hours as needed for pain 11/05/17  Yes [provider]  OVER THE COUNTER MEDICATION Take 1 tablet by mouth daily as needed (PainAll for pain).    Yes [provider]  ondansetron (ZOFRAN) 4 MG tablet Take 1 tablet (4 mg total) by mouth every 6 (six) hours as needed for nausea or vomiting. 01/30/18   Julianne Rice, MD  oxyCODONE (ROXICODONE) 5 MG immediate release tablet Take 1 tablet (5 mg total) by mouth every 6 (six) hours as needed for moderate pain or severe pain. 01/30/18   Julianne Rice, MD    Family History Family History  Problem Relation Age of Onset  . Arthritis Paternal Grandmother   . Diabetes Paternal Grandmother   . Hypertension  Paternal Grandmother   . Stroke Paternal Grandmother   . Diabetes Mother   . Colon cancer Neg Hx     Social History Social History   Tobacco Use  . Smoking status: Current Every Day Smoker    Packs/day: 0.50    Years: 20.00    Pack years: 10.00    Types: Cigarettes, Cigars  . Smokeless tobacco: Never Used  . Tobacco comment: Tobacco info given  Substance Use Topics  . Alcohol use: Yes    Comment: occ.  . Drug use: No     Allergies   Naftin [naftifine hcl]   Review of Systems Review of Systems  Constitutional: Negative for chills and fever.  HENT: Negative for congestion and trouble swallowing.   Eyes: Negative for visual disturbance.  Respiratory: Negative for cough and shortness of breath.   Cardiovascular: Negative for chest pain.  Gastrointestinal: Positive for abdominal pain, diarrhea, nausea and vomiting. Negative for blood in stool and constipation.  Genitourinary: Negative for dysuria, flank pain, frequency and hematuria.  Musculoskeletal: Negative for back pain, myalgias, neck pain and neck stiffness.  Skin: Negative for rash and wound.  Neurological: Negative for dizziness, weakness, light-headedness, numbness and headaches.  All other systems reviewed and are negative.    Physical Exam Updated Vital Signs BP 123/78  Pulse 66   Temp 97.6 F (36.4 C) (Oral)   Resp 17   Wt 83.9 kg (185 lb)   SpO2 98%   BMI 28.55 kg/m   Physical Exam  Constitutional: She is oriented to person, place, and time. She appears well-developed and well-nourished. No distress.  HENT:  Head: Normocephalic and atraumatic.  Mouth/Throat: Oropharynx is clear and moist. No oropharyngeal exudate.  Eyes: Pupils are equal, round, and reactive to light. EOM are normal.  Neck: Normal range of motion. Neck supple. No JVD present.  Cardiovascular: Normal rate and regular rhythm. Exam reveals no gallop and no friction rub.  No murmur heard. Pulmonary/Chest: Effort normal and breath  sounds normal. No stridor. No respiratory distress. She has no wheezes. She has no rales. She exhibits no tenderness.  Abdominal: Soft. Bowel sounds are normal. There is tenderness. There is no rebound and no guarding.  Epigastric tenderness to palpation.  No rebound or guarding.  Musculoskeletal: Normal range of motion. She exhibits no edema or tenderness.  No CVA tenderness bilaterally.  No lower extremity swelling, asymmetry or tenderness.  Lymphadenopathy:    She has no cervical adenopathy.  Neurological: She is alert and oriented to person, place, and time.  Moves all extremities without focal deficit.  Sensation fully intact.  Skin: Skin is warm and dry. Capillary refill takes less than 2 seconds. No rash noted. She is not diaphoretic. No erythema.  Psychiatric: She has a normal mood and affect. Her behavior is normal.  Nursing note and vitals reviewed.    ED Treatments / Results  Labs (all labs ordered are listed, but only abnormal results are displayed) Labs Reviewed  LIPASE, BLOOD - Abnormal; Notable for the following components:      Result Value   Lipase 113 (*)    All other components within normal limits  COMPREHENSIVE METABOLIC PANEL - Abnormal; Notable for the following components:   Sodium 134 (*)    Potassium 3.4 (*)    Glucose, Bld 313 (*)    Total Bilirubin 0.1 (*)    All other components within normal limits  URINALYSIS, ROUTINE W REFLEX MICROSCOPIC - Abnormal; Notable for the following components:   Glucose, UA >=500 (*)    Hgb urine dipstick SMALL (*)    Bacteria, UA FEW (*)    All other components within normal limits  CBC    EKG None  Radiology Ct Abdomen Pelvis W Contrast  Result Date: 01/30/2018 CLINICAL DATA:  Emesis and diarrhea for 2 days.  Abdominal pain. EXAM: CT ABDOMEN AND PELVIS WITH CONTRAST TECHNIQUE: Multidetector CT imaging of the abdomen and pelvis was performed using the standard protocol following bolus administration of intravenous  contrast. CONTRAST:  127mL ISOVUE-300 IOPAMIDOL (ISOVUE-300) INJECTION 61% COMPARISON:  04/03/2017 stone study FINDINGS: Lower chest: Clear lung bases. Normal heart size without pericardial or pleural effusion. Hepatobiliary: Mild hepatomegaly at nearly 18 cm craniocaudal. Suspect developing cirrhosis, as evidenced by prominent caudate and lateral segment left liver lobe. No focal liver lesion. Cholecystectomy, without biliary ductal dilatation. Pancreas: Normal, without mass or ductal dilatation. Spleen: Normal in size, without focal abnormality. Adrenals/Urinary Tract: Normal adrenal glands. Normal kidneys, without hydronephrosis. Trace air within the nondependent urinary bladder on image 69/2. Stomach/Bowel: Normal stomach, without wall thickening. Scattered colonic diverticula. Normal terminal ileum and appendix. Normal small bowel. Vascular/Lymphatic: Aortic and branch vessel atherosclerosis. No portal venous hypertension. No abdominopelvic adenopathy. Reproductive: Normal uterus and adnexa. Other: No significant free fluid.  No free intraperitoneal air. Musculoskeletal:  Degenerate changes of bilateral hips and the symphysis pubis. Prominent disc bulge at the lumbosacral junction. IMPRESSION: 1.  No acute process in the abdomen or pelvis. 2. Mild hepatomegaly. Suspicion of mild cirrhosis. Correlate with risk factors. 3.  Aortic Atherosclerosis (ICD10-I70.0). 4. Air in the nondependent urinary bladder. Correlate with instrumentation. Electronically Signed   By: Abigail Miyamoto M.D.   On: 01/30/2018 15:49    Procedures Procedures (including critical care time)  Medications Ordered in ED Medications  sodium chloride 0.9 % bolus 1,000 mL (0 mLs Intravenous Stopped 01/30/18 1541)  morphine 4 MG/ML injection 4 mg (4 mg Intravenous Given 01/30/18 1358)  ondansetron (ZOFRAN) injection 4 mg (4 mg Intravenous Given 01/30/18 1358)  iopamidol (ISOVUE-300) 61 % injection 100 mL (100 mLs Intravenous Contrast Given  01/30/18 1510)  oxyCODONE (Oxy IR/ROXICODONE) immediate release tablet 5 mg (5 mg Oral Given 01/30/18 1731)     Initial Impression / Assessment and Plan / ED Course  I have reviewed the triage vital signs and the nursing notes.  Pertinent labs & imaging results that were available during my care of the patient were reviewed by me and considered in my medical decision making (see chart for details).    Mildly elevated lipase of uncertain significance.  CT abdomen without evidence of pancreatic inflammation.  Patient does have some enlargement of her liver.  Admits to recent alcohol intake.  Patient states her pain is improved.  Will attempt p.o. trial. Patient tolerating oral intake.  Pain is controlled.  Will discharge with pain medication, nausea medication and clear liquid diet.  Advance as tolerated.  Advised to follow-up with gastroneurology.  Return precautions given. Final Clinical Impressions(s) / ED Diagnoses   Final diagnoses:  Acute pancreatitis, unspecified complication status, unspecified pancreatitis type  Nausea vomiting and diarrhea    ED Discharge Orders        Ordered    oxyCODONE (ROXICODONE) 5 MG immediate release tablet  Every 6 hours PRN     01/30/18 1917    ondansetron (ZOFRAN) 4 MG tablet  Every 6 hours PRN     01/30/18 1917       Julianne Rice, MD 01/31/18 1754

## 2018-05-18 ENCOUNTER — Emergency Department (HOSPITAL_COMMUNITY)
Admission: EM | Admit: 2018-05-18 | Discharge: 2018-05-18 | Disposition: A | Discharge status: Home / Self Care | Payer: Self-pay | Attending: Emergency Medicine | Admitting: Emergency Medicine

## 2018-05-18 ENCOUNTER — Encounter (HOSPITAL_COMMUNITY): Payer: Self-pay | Admitting: Emergency Medicine

## 2018-05-18 ENCOUNTER — Other Ambulatory Visit: Payer: Self-pay

## 2018-05-18 DIAGNOSIS — B349 Viral infection, unspecified: Secondary | ICD-10-CM | POA: Insufficient documentation

## 2018-05-18 DIAGNOSIS — F17210 Nicotine dependence, cigarettes, uncomplicated: Secondary | ICD-10-CM | POA: Insufficient documentation

## 2018-05-18 DIAGNOSIS — N390 Urinary tract infection, site not specified: Secondary | ICD-10-CM | POA: Insufficient documentation

## 2018-05-18 LAB — URINALYSIS, ROUTINE W REFLEX MICROSCOPIC
Bilirubin Urine: NEGATIVE
Glucose, UA: 50 mg/dL — AB
KETONES UR: NEGATIVE mg/dL
Nitrite: POSITIVE — AB
PROTEIN: NEGATIVE mg/dL
Specific Gravity, Urine: 1.016 (ref 1.005–1.030)
pH: 5 (ref 5.0–8.0)

## 2018-05-18 LAB — BASIC METABOLIC PANEL
ANION GAP: 8 (ref 5–15)
BUN: 12 mg/dL (ref 6–20)
CO2: 28 mmol/L (ref 22–32)
Calcium: 9 mg/dL (ref 8.9–10.3)
Chloride: 103 mmol/L (ref 98–111)
Creatinine, Ser: 0.71 mg/dL (ref 0.44–1.00)
GFR calc Af Amer: >60 mL/min (ref >60)
GFR calc non Af Amer: >60 mL/min (ref >60)
Glucose, Bld: 152 mg/dL — ABNORMAL HIGH (ref 70–99)
POTASSIUM: 3.3 mmol/L — AB (ref 3.5–5.1)
SODIUM: 139 mmol/L (ref 135–145)

## 2018-05-18 LAB — CBC WITH DIFFERENTIAL/PLATELET
BASOS ABS: 0 10*3/uL (ref 0.0–0.1)
Basophils Relative: 0 %
Eosinophils Absolute: 0.1 10*3/uL (ref 0.0–0.7)
Eosinophils Relative: 1 %
HEMATOCRIT: 37.5 % (ref 36.0–46.0)
Hemoglobin: 13.2 g/dL (ref 12.0–15.0)
LYMPHS PCT: 31 %
Lymphs Abs: 3.2 10*3/uL (ref 0.7–4.0)
MCH: 29.9 pg (ref 26.0–34.0)
MCHC: 35.2 g/dL (ref 30.0–36.0)
MCV: 84.8 fL (ref 78.0–100.0)
Monocytes Absolute: 0.6 10*3/uL (ref 0.1–1.0)
Monocytes Relative: 6 %
NEUTROS ABS: 6.4 10*3/uL (ref 1.7–7.7)
Neutrophils Relative %: 62 %
Platelets: 338 10*3/uL (ref 150–400)
RBC: 4.42 MIL/uL (ref 3.87–5.11)
RDW: 12.5 % (ref 11.5–15.5)
WBC: 10.4 10*3/uL (ref 4.0–10.5)

## 2018-05-18 MED ORDER — DIPHENHYDRAMINE HCL 50 MG/ML IJ SOLN
12.5000 mg | Freq: Once | INTRAMUSCULAR | Status: AC
  Administered 2018-05-18: 12.5 mg via INTRAVENOUS
  Filled 2018-05-18: qty 1

## 2018-05-18 MED ORDER — CEPHALEXIN 500 MG PO CAPS: 500.0000 mg | ORAL_CAPSULE | Freq: Four times a day (QID) | ORAL | 0 refills | Status: DC

## 2018-05-18 MED ORDER — ONDANSETRON HCL 4 MG/2ML IJ SOLN
4.0000 mg | Freq: Once | INTRAMUSCULAR | Status: AC
  Administered 2018-05-18: 4 mg via INTRAVENOUS
  Filled 2018-05-18: qty 2

## 2018-05-18 MED ORDER — IBUPROFEN 800 MG PO TABS: 800.0000 mg | ORAL_TABLET | Freq: Three times a day (TID) | ORAL | 0 refills | Status: DC

## 2018-05-18 MED ORDER — SODIUM CHLORIDE 0.9 % IV BOLUS
1000.0000 mL | Freq: Once | INTRAVENOUS | Status: AC
  Administered 2018-05-18: 1000 mL via INTRAVENOUS

## 2018-05-18 MED ORDER — KETOROLAC TROMETHAMINE 30 MG/ML IJ SOLN
30.0000 mg | Freq: Once | INTRAMUSCULAR | Status: AC
  Administered 2018-05-18: 30 mg via INTRAVENOUS
  Filled 2018-05-18: qty 1

## 2018-05-18 NOTE — ED Notes (Signed)
Pt ambulatory to waiting room. Pt verbalized understanding of discharge instructions.   

## 2018-05-18 NOTE — ED Provider Notes (Signed)
[facility] EMERGENCY DEPARTMENT Provider Note   CSN: [id number] Arrival date & time: 05/18/18  2016     History   Chief Complaint Chief Complaint  Patient presents with  . Influenza    HPI Carol [name] is a 52 y.o. female.  HPI   Carol [name] is a 52 y.o. female who presents to the Emergency Department complaining of generalized body aches, fever, chills, intermittent vomiting and diarrhea.  Symptoms have been present for 4 days.  She reports vomiting and diarrhea are associated with food intake only, after attempting to eat fried chicken and crab legs.  She has tolerated small amounts of fluids without vomiting.  She also complains of crampy abdominal pain that is intermittent, generalized frontal headache that began earlier today.  Headache describes as "feels like a fullness in my head"  She has not taken any medications for symptomatic relief.  Denies known sick contacts.  She also denies chest pain, shortness of breath, persistent cough, neck pain or stiffness, and rash   Past Medical History:  Diagnosis Date  . Back pain   . Bronchitis   . Cervicalgia   . History of blood in urine    microscopic  . History of migraine headaches   . Impaired fasting glucose     Patient Active Problem List   Diagnosis Date Noted  . 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 ABDOMINAL HYSTERECTOMY  2002  . CHOLECYSTECTOMY    . TUBAL LIGATION       OB History   None      Home Medications    Prior to Admission medications   Medication Sig Start Date End Date Taking? Authorizing Provider  ibuprofen (ADVIL,MOTRIN) 800 MG tablet Take 800 mg by mouth. Every 6 to 8 hours as needed for pain 11/05/17   [provider]  ondansetron (ZOFRAN) 4 MG tablet Take 1 tablet (4 mg total) by mouth every 6 (six) hours as needed for nausea or vomiting. 01/30/18   Julianne Rice, MD  OVER THE COUNTER MEDICATION Take 1 tablet by mouth  daily as needed (PainAll for pain).     [provider]  oxyCODONE (ROXICODONE) 5 MG immediate release tablet Take 1 tablet (5 mg total) by mouth every 6 (six) hours as needed for moderate pain or severe pain. 01/30/18   Julianne Rice, MD    Family History Family History  Problem Relation Age of Onset  . Arthritis Paternal Grandmother   . Diabetes Paternal Grandmother   . Hypertension Paternal Grandmother   . Stroke Paternal Grandmother   . Diabetes Mother   . Colon cancer Neg Hx     Social History Social History   Tobacco Use  . Smoking status: Current Every Day Smoker    Packs/day: 0.50    Years: 20.00    Pack years: 10.00    Types: Cigarettes, Cigars  . Smokeless tobacco: Never Used  . Tobacco comment: Tobacco info given  Substance Use Topics  . Alcohol use: Yes    Comment: occ.  . Drug use: No     Allergies   Naftin [naftifine hcl]   Review of Systems Review of Systems  Constitutional: Positive for appetite change, chills and fever. Negative for activity change.  HENT: Positive for congestion. Negative for facial swelling, rhinorrhea, sore throat and trouble swallowing.   Eyes: Negative for visual disturbance.  Respiratory: Positive for cough. Negative for shortness of breath, wheezing  and stridor.   Cardiovascular: Negative for chest pain.  Gastrointestinal: Positive for diarrhea and vomiting. Negative for abdominal pain and nausea.  Genitourinary: Negative for flank pain.  Musculoskeletal: Negative for neck pain and neck stiffness.  Skin: Negative for rash.  Neurological: Positive for headaches. Negative for dizziness, syncope, weakness and numbness.  Hematological: Negative for adenopathy.  Psychiatric/Behavioral: Negative for confusion.     Physical Exam Updated Vital Signs BP (!) 154/78 (BP Location: Right Arm)   Pulse 78   Temp 98.4 F (36.9 C) (Oral)   Resp 19   Ht 5' 7.5" (1.715 m)   Wt 83.9 kg   SpO2 98%   BMI 28.55 kg/m    Physical Exam  Constitutional: She appears well-developed and well-nourished. No distress.  HENT:  Head: Normocephalic and atraumatic.  Right Ear: Tympanic membrane and ear canal normal.  Left Ear: Tympanic membrane and ear canal normal.  Nose: Mucosal edema and rhinorrhea present.  Mouth/Throat: Uvula is midline and mucous membranes are normal. No trismus in the jaw. No uvula swelling. Posterior oropharyngeal erythema present. No oropharyngeal exudate, posterior oropharyngeal edema or tonsillar abscesses.  Eyes: Conjunctivae are normal.  Neck: Normal range of motion, full passive range of motion without pain and phonation normal. Neck supple. No Kernig's sign noted.  Cardiovascular: Normal rate, regular rhythm and intact distal pulses.  No murmur heard. Pulmonary/Chest: Effort normal and breath sounds normal. No respiratory distress. She has no wheezes. She has no rales.  Abdominal: Soft. Normal appearance. She exhibits no distension. There is generalized tenderness and tenderness in the suprapubic area. There is no rigidity, no rebound, no guarding and no CVA tenderness.  Some generalized tenderness to palpation of the abdomen.  No guarding or rebound tenderness.    Musculoskeletal: She exhibits no edema.  Lymphadenopathy:    She has no cervical adenopathy.  Neurological: She is alert. She has normal strength. No sensory deficit. She exhibits normal muscle tone. GCS eye subscore is 4. GCS verbal subscore is 5. GCS motor subscore is 6.  CN II-XII intact, speech clear, no pronator drift,  Skin: Skin is warm. Capillary refill takes less than 2 seconds.  Psychiatric: She has a normal mood and affect.  Nursing note and vitals reviewed.    ED Treatments / Results  Labs (all labs ordered are listed, but only abnormal results are displayed) Labs Reviewed  BASIC METABOLIC PANEL - Abnormal; Notable for the following components:      Result Value   Potassium 3.3 (*)    Glucose, Bld 152  (*)    All other components within normal limits  URINALYSIS, ROUTINE W REFLEX MICROSCOPIC - Abnormal; Notable for the following components:   APPearance HAZY (*)    Glucose, UA 50 (*)    Hgb urine dipstick LARGE (*)    Nitrite POSITIVE (*)    Leukocytes, UA SMALL (*)    Bacteria, UA MANY (*)    All other components within normal limits  URINE CULTURE  CBC WITH DIFFERENTIAL/PLATELET    EKG None  Radiology No results found.  Procedures Procedures (including critical care time)  Medications Ordered in ED Medications  sodium chloride 0.9 % bolus 1,000 mL (1,000 mLs Intravenous New Bag/Given 05/18/18 2134)  ondansetron (ZOFRAN) injection 4 mg (4 mg Intravenous Given 05/18/18 2135)  ketorolac (TORADOL) 30 MG/ML injection 30 mg (30 mg Intravenous Given 05/18/18 2138)  diphenhydrAMINE (BENADRYL) injection 12.5 mg (12.5 mg Intravenous Given 05/18/18 2136)     Initial Impression / Assessment  and Plan / ED Course  I have reviewed the triage vital signs and the nursing notes.  Pertinent labs & imaging results that were available during my care of the patient were reviewed by me and considered in my medical decision making (see chart for details).     Patient nontoxic-appearing, mucous membranes are moist.  Labs reassuring although urinalysis shows likely UTI.  No CVA tenderness on exam.  Cultures pending. On further history taking, she admits to malodorous urine and increased frequency.  she reports feeling better after IV fluids and medications.  No meningeal signs. She appears appropriate for discharge home, will treat with antibiotics for likely UTI.  She agrees to close PCP follow-up and return precautions discussed.  Final Clinical Impressions(s) / ED Diagnoses   Final diagnoses:  Viral illness  Urinary tract infection in female    ED Discharge Orders    None       Kem Parkinson, PA-C 05/18/18 2313    Virgel Manifold, MD 05/19/18 1506

## 2018-05-18 NOTE — ED Triage Notes (Addendum)
Pt c/o flu-like symptoms, including body aches, fever, chills, diarrhea x 4 days, no temp during triage, pt has not taken any meds, 3 episodes of diarrhea, vomited twice

## 2018-05-18 NOTE — Discharge Instructions (Addendum)
Your urine tonight shows that you have a urinary tract infection.  It is important that you drink plenty of water and clear fluids for the next several days.  Take the antibiotic as directed until its finished.  Bland diet as tolerated.  Follow-up with your primary provider or return to the ER for any worsening symptoms.

## 2018-05-20 ENCOUNTER — Emergency Department (HOSPITAL_COMMUNITY)
Admission: EM | Admit: 2018-05-20 | Discharge: 2018-05-20 | Disposition: A | Discharge status: Home / Self Care | Payer: Self-pay | Attending: Emergency Medicine | Admitting: Emergency Medicine

## 2018-05-20 ENCOUNTER — Other Ambulatory Visit: Payer: Self-pay

## 2018-05-20 ENCOUNTER — Encounter (HOSPITAL_COMMUNITY): Payer: Self-pay | Admitting: *Deleted

## 2018-05-20 DIAGNOSIS — F17210 Nicotine dependence, cigarettes, uncomplicated: Secondary | ICD-10-CM | POA: Insufficient documentation

## 2018-05-20 DIAGNOSIS — Z79899 Other long term (current) drug therapy: Secondary | ICD-10-CM | POA: Insufficient documentation

## 2018-05-20 DIAGNOSIS — N390 Urinary tract infection, site not specified: Secondary | ICD-10-CM | POA: Insufficient documentation

## 2018-05-20 LAB — BASIC METABOLIC PANEL
Anion gap: 8 (ref 5–15)
BUN: 7 mg/dL (ref 6–20)
CALCIUM: 9.1 mg/dL (ref 8.9–10.3)
CO2: 28 mmol/L (ref 22–32)
CREATININE: 0.6 mg/dL (ref 0.44–1.00)
Chloride: 104 mmol/L (ref 98–111)
Glucose, Bld: 150 mg/dL — ABNORMAL HIGH (ref 70–99)
Potassium: 3.5 mmol/L (ref 3.5–5.1)
SODIUM: 140 mmol/L (ref 135–145)

## 2018-05-20 LAB — CBC WITH DIFFERENTIAL/PLATELET
BASOS PCT: 0 %
Basophils Absolute: 0 10*3/uL (ref 0.0–0.1)
EOS ABS: 0.2 10*3/uL (ref 0.0–0.7)
Eosinophils Relative: 2 %
HCT: 37.2 % (ref 36.0–46.0)
HEMOGLOBIN: 12.9 g/dL (ref 12.0–15.0)
Lymphocytes Relative: 31 %
Lymphs Abs: 3.1 10*3/uL (ref 0.7–4.0)
MCH: 29.5 pg (ref 26.0–34.0)
MCHC: 34.7 g/dL (ref 30.0–36.0)
MCV: 84.9 fL (ref 78.0–100.0)
Monocytes Absolute: 0.5 10*3/uL (ref 0.1–1.0)
Monocytes Relative: 5 %
NEUTROS PCT: 62 %
Neutro Abs: 6.2 10*3/uL (ref 1.7–7.7)
Platelets: 353 10*3/uL (ref 150–400)
RBC: 4.38 MIL/uL (ref 3.87–5.11)
RDW: 12.5 % (ref 11.5–15.5)
WBC: 10 10*3/uL (ref 4.0–10.5)

## 2018-05-20 MED ORDER — SODIUM CHLORIDE 0.9 % IV BOLUS
1000.0000 mL | Freq: Once | INTRAVENOUS | Status: AC
  Administered 2018-05-20: 1000 mL via INTRAVENOUS

## 2018-05-20 MED ORDER — SODIUM CHLORIDE 0.9 % IV SOLN
1.0000 g | Freq: Once | INTRAVENOUS | Status: AC
  Administered 2018-05-20: 1 g via INTRAVENOUS
  Filled 2018-05-20: qty 10

## 2018-05-20 MED ORDER — SODIUM CHLORIDE 0.9 % IV SOLN
INTRAVENOUS | Status: DC | PRN
  Administered 2018-05-20: 21:00:00 via INTRAVENOUS

## 2018-05-20 NOTE — ED Provider Notes (Signed)
[facility] EMERGENCY DEPARTMENT Provider Note   CSN: [id number] Arrival date & time: 05/20/18  1956     History   Chief Complaint Chief Complaint  Patient presents with  . Generalized Body Aches    HPI Carol [name] is a 52 y.o. female.  HPI Patient presents with body aches and a headache.  Seen 2 days ago with URI symptoms and diagnosed with a urinary tract infection.  Had been started on Keflex.  Has had 1 day worth of medicine.  States that the urinary symptoms are improving but has more pain in the abdomen.  No fevers.  No nausea or vomiting.  Just feels bad all over. Past Medical History:  Diagnosis Date  . Back pain   . Bronchitis   . Cervicalgia   . History of blood in urine    microscopic  . History of migraine headaches   . Impaired fasting glucose     Patient Active Problem List   Diagnosis Date Noted  . 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 ABDOMINAL HYSTERECTOMY  2002  . CHOLECYSTECTOMY    . TUBAL LIGATION       OB History   None      Home Medications    Prior to Admission medications   Medication Sig Start Date End Date Taking? Authorizing Provider  cephALEXin (KEFLEX) 500 MG capsule Take 1 capsule (500 mg total) by mouth 4 (four) times daily. For 7 days 05/18/18   Triplett, Tammy, PA-C  ibuprofen (ADVIL,MOTRIN) 800 MG tablet Take 1 tablet (800 mg total) by mouth 3 (three) times daily. 05/18/18   Triplett, Tammy, PA-C  ondansetron (ZOFRAN) 4 MG tablet Take 1 tablet (4 mg total) by mouth every 6 (six) hours as needed for nausea or vomiting. 01/30/18   Julianne Rice, MD  OVER THE COUNTER MEDICATION Take 1 tablet by mouth daily as needed (PainAll for pain).     [provider]  oxyCODONE (ROXICODONE) 5 MG immediate release tablet Take 1 tablet (5 mg total) by mouth every 6 (six) hours as needed for moderate pain or severe pain. 01/30/18   Julianne Rice, MD    Family History Family  History  Problem Relation Age of Onset  . Arthritis Paternal Grandmother   . Diabetes Paternal Grandmother   . Hypertension Paternal Grandmother   . Stroke Paternal Grandmother   . Diabetes Mother   . Colon cancer Neg Hx     Social History Social History   Tobacco Use  . Smoking status: Current Every Day Smoker    Packs/day: 0.50    Years: 20.00    Pack years: 10.00    Types: Cigarettes, Cigars  . Smokeless tobacco: Never Used  . Tobacco comment: Tobacco info given  Substance Use Topics  . Alcohol use: Yes    Comment: occ.  . Drug use: No     Allergies   Naftin [naftifine hcl]   Review of Systems Review of Systems  Constitutional: Positive for appetite change.  Respiratory: Negative for shortness of breath.   Gastrointestinal: Positive for abdominal pain.  Genitourinary: Negative for dysuria.  Musculoskeletal: Negative for back pain.  Skin: Negative for pallor.  Neurological: Positive for headaches.  Hematological: Negative for adenopathy.  Psychiatric/Behavioral: Negative for confusion.     Physical Exam Updated Vital Signs BP (!) 159/86 (BP Location: Right Arm)   Pulse 70   Temp 98.1 F (36.7 C) (Oral)   Resp  18   SpO2 100%   Physical Exam  Constitutional: She appears well-developed.  HENT:  Head: Atraumatic.  Eyes: Pupils are equal, round, and reactive to light.  Neck: Neck supple.  Cardiovascular: Normal rate.  Abdominal: There is tenderness.  Right sided abdominal tenderness.  Lower to mid abdomen.  No CVA tenderness.  No hernia palpated.  Musculoskeletal: She exhibits no tenderness.  Neurological: She is alert.  Skin: Skin is warm. Capillary refill takes less than 2 seconds.     ED Treatments / Results  Labs (all labs ordered are listed, but only abnormal results are displayed) Labs Reviewed  BASIC METABOLIC PANEL - Abnormal; Notable for the following components:      Result Value   Glucose, Bld 150 (*)    All other components within  normal limits  CBC WITH DIFFERENTIAL/PLATELET    EKG None  Radiology No results found.  Procedures Procedures (including critical care time)  Medications Ordered in ED Medications  0.9 %  sodium chloride infusion ( Intravenous Stopped 05/20/18 2156)  sodium chloride 0.9 % bolus 1,000 mL (0 mLs Intravenous Stopped 05/20/18 2222)  cefTRIAXone (ROCEPHIN) 1 g in sodium chloride 0.9 % 100 mL IVPB (0 g Intravenous Stopped 05/20/18 2156)     Initial Impression / Assessment and Plan / ED Course  I have reviewed the triage vital signs and the nursing notes.  Pertinent labs & imaging results that were available during my care of the patient were reviewed by me and considered in my medical decision making (see chart for details).     Patient diagnosed with UTI 2 days ago.  Culture has grown E. coli but susceptibilities not back yet.  Patient is on Keflex.  Lab work today reassuring.  Normal white count.  Good renal function.  Given IV Rocephin.  Culture should be back hopefully tomorrow.  Discharge home.  Final Clinical Impressions(s) / ED Diagnoses   Final diagnoses:  Urinary tract infection without hematuria, site unspecified    ED Discharge Orders    None       Davonna Belling, MD 05/20/18 2310

## 2018-05-20 NOTE — ED Triage Notes (Signed)
Pt states she was seen here x 2 days ago and diagnosed with a UTI and states she has been taking her meds as prescribed but she feels worse with generalized body aches and headache

## 2018-05-20 NOTE — Discharge Instructions (Signed)
Your urine culture should be more complete tomorrow.  He will be called if the antibiotic choice does not match susceptibilities.

## 2018-05-21 LAB — URINE CULTURE

## 2018-05-22 ENCOUNTER — Encounter (HOSPITAL_COMMUNITY): Payer: Self-pay | Admitting: Emergency Medicine

## 2018-05-22 ENCOUNTER — Telehealth: Payer: Self-pay

## 2018-05-22 ENCOUNTER — Emergency Department (HOSPITAL_COMMUNITY)
Admission: EM | Admit: 2018-05-22 | Discharge: 2018-05-22 | Disposition: A | Discharge status: Home / Self Care | Payer: Self-pay | Attending: Emergency Medicine | Admitting: Emergency Medicine

## 2018-05-22 ENCOUNTER — Other Ambulatory Visit: Payer: Self-pay

## 2018-05-22 ENCOUNTER — Emergency Department (HOSPITAL_COMMUNITY): Payer: Self-pay

## 2018-05-22 DIAGNOSIS — R059 Cough, unspecified: Secondary | ICD-10-CM

## 2018-05-22 DIAGNOSIS — R05 Cough: Secondary | ICD-10-CM | POA: Insufficient documentation

## 2018-05-22 DIAGNOSIS — F17210 Nicotine dependence, cigarettes, uncomplicated: Secondary | ICD-10-CM | POA: Insufficient documentation

## 2018-05-22 DIAGNOSIS — Z79899 Other long term (current) drug therapy: Secondary | ICD-10-CM | POA: Insufficient documentation

## 2018-05-22 DIAGNOSIS — J069 Acute upper respiratory infection, unspecified: Secondary | ICD-10-CM | POA: Insufficient documentation

## 2018-05-22 LAB — INFLUENZA PANEL BY PCR (TYPE A & B)
Influenza A By PCR: NEGATIVE
Influenza B By PCR: NEGATIVE

## 2018-05-22 MED ORDER — AZITHROMYCIN 250 MG PO TABS: ORAL_TABLET | ORAL | 0 refills | Status: DC

## 2018-05-22 NOTE — Telephone Encounter (Signed)
Post ED Visit - Positive Culture Follow-up  Culture report reviewed by antimicrobial stewardship pharmacist:  []  Elenor Quinones, Pharm.D. []  Heide Guile, Pharm.D., BCPS AQ-ID []  Parks Neptune, Pharm.D., BCPS []  Alycia Rossetti, Pharm.D., BCPS []  [place], Pharm.D., BCPS, AAHIVP []  Legrand Como, Pharm.D., BCPS, AAHIVP []  Salome Arnt, PharmD, BCPS []  Johnnette Gourd, PharmD, BCPS [x]  Hughes Better, PharmD, BCPS []  Leeroy Cha, PharmD  Positive urine culture Treated with Cephalexin, organism sensitive to the same and no further patient follow-up is required at this time.  Genia Del 05/22/2018, 10:17 AM

## 2018-05-22 NOTE — Discharge Instructions (Signed)
It was our pleasure to provide your ER care today - we hope that you feel better.  Rest. Drink plenty of fluids.   Take zithromax as prescribed.  Take acetaminophen and/or ibuprofen as need.   Return to ER if worse, new symptoms, increased trouble breathing, other concern.

## 2018-05-22 NOTE — ED Triage Notes (Signed)
Patient c/o generalized body aches with productive cough, headache, and sore throat since Tuesday. Patient states she was seen here in ER on Tuesday  And Thursday, given IVF, diagnosed with dehydration and UTI. Denies having chest x-ray. Patient states she is still not improving despite taking ibuprofen 800mg  and cephalexin-last doses at 1:30pm today. Patient also taking Excedrin migraine with some relief.

## 2018-05-22 NOTE — ED Provider Notes (Signed)
[facility] EMERGENCY DEPARTMENT Provider Note   CSN: [id number] Arrival date & time: 05/22/18  1415     History   Chief Complaint Chief Complaint  Patient presents with  . Generalized Body Aches    HPI Carol [name] is a 52 y.o. female.  Patient c/o body aches, subjective fever, prod cough, scratchy throat, and nasal congestion for the past several days. Symptoms acute onset, moderate, persistent, onset 4-5 days ago. Pt indicates is being treated for uti, is taking antibiotic as prescribed and has 2 days left. States prior urinary symptoms have resolved. No abd or flank pain. No vomiting. Denies neck pain or stiffness. No severe headaches. No known ill contacts.   The history is provided by the patient.    Past Medical History:  Diagnosis Date  . Back pain   . Bronchitis   . Cervicalgia   . History of blood in urine    microscopic  . History of migraine headaches   . Impaired fasting glucose     Patient Active Problem List   Diagnosis Date Noted  . 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 ABDOMINAL HYSTERECTOMY  2002  . CHOLECYSTECTOMY    . TUBAL LIGATION       OB History   None      Home Medications    Prior to Admission medications   Medication Sig Start Date End Date Taking? Authorizing Provider  cephALEXin (KEFLEX) 500 MG capsule Take 1 capsule (500 mg total) by mouth 4 (four) times daily. For 7 days 05/18/18   Triplett, Tammy, PA-C  ibuprofen (ADVIL,MOTRIN) 800 MG tablet Take 1 tablet (800 mg total) by mouth 3 (three) times daily. 05/18/18   Triplett, Tammy, PA-C  ondansetron (ZOFRAN) 4 MG tablet Take 1 tablet (4 mg total) by mouth every 6 (six) hours as needed for nausea or vomiting. 01/30/18   Julianne Rice, MD  OVER THE COUNTER MEDICATION Take 1 tablet by mouth daily as needed (PainAll for pain).     [provider]  oxyCODONE (ROXICODONE) 5 MG immediate release tablet Take 1 tablet (5 mg  total) by mouth every 6 (six) hours as needed for moderate pain or severe pain. 01/30/18   Julianne Rice, MD    Family History Family History  Problem Relation Age of Onset  . Arthritis Paternal Grandmother   . Diabetes Paternal Grandmother   . Hypertension Paternal Grandmother   . Stroke Paternal Grandmother   . Diabetes Mother   . Colon cancer Neg Hx     Social History Social History   Tobacco Use  . Smoking status: Current Every Day Smoker    Packs/day: 0.50    Years: 20.00    Pack years: 10.00    Types: Cigarettes, Cigars  . Smokeless tobacco: Never Used  . Tobacco comment: Tobacco info given  Substance Use Topics  . Alcohol use: Yes    Comment: occ.  . Drug use: No     Allergies   Naftin [naftifine hcl]   Review of Systems Review of Systems  Constitutional: Positive for fever.  HENT: Positive for congestion and sore throat.   Eyes: Negative for redness.  Respiratory: Positive for cough. Negative for shortness of breath.   Cardiovascular: Negative for chest pain.  Gastrointestinal: Negative for abdominal pain, diarrhea and vomiting.  Genitourinary: Negative for dysuria.  Musculoskeletal: Negative for back pain, neck pain and neck stiffness.  Skin: Negative for rash.  Neurological:  Negative for headaches.  Hematological: Does not bruise/bleed easily.  Psychiatric/Behavioral: Negative for confusion.     Physical Exam Updated Vital Signs BP (!) 164/79 (BP Location: Right Arm)   Pulse 96   Temp 98.3 F (36.8 C) (Oral)   Resp 18   Ht 1.715 m (5' 7.5")   Wt 83.9 kg   SpO2 98%   BMI 28.55 kg/m   Physical Exam  Constitutional: She appears well-developed and well-nourished.  HENT:  Nasal congestion. tms normal.   Eyes: Conjunctivae are normal. No scleral icterus.  Neck: Neck supple. No tracheal deviation present.  No stiffness or rigidity.   Cardiovascular: Normal rate, regular rhythm, normal heart sounds and intact distal pulses. Exam reveals no  gallop and no friction rub.  No murmur heard. Pulmonary/Chest: Effort normal. No respiratory distress.  Few rhonchi left  Abdominal: Soft. Normal appearance and bowel sounds are normal. She exhibits no distension. There is no tenderness.  Genitourinary:  Genitourinary Comments: No cva tenderness  Musculoskeletal: She exhibits no edema.  Lymphadenopathy:    She has no cervical adenopathy.  Neurological: She is alert.  Speech normal. Steady gait.   Skin: Skin is warm and dry. No rash noted.  Psychiatric: She has a normal mood and affect.  Nursing note and vitals reviewed.    ED Treatments / Results  Labs (all labs ordered are listed, but only abnormal results are displayed) Results for orders placed or performed during the hospital encounter of 05/22/18  Influenza panel by PCR (type A & B)  Result Value Ref Range   Influenza A By PCR NEGATIVE NEGATIVE   Influenza B By PCR NEGATIVE NEGATIVE   Dg Chest 2 View  Result Date: 05/22/2018 CLINICAL DATA:  Cough, body aches EXAM: CHEST - 2 VIEW COMPARISON:  09/09/2016 FINDINGS: Heart and mediastinal contours are within normal limits. No focal opacities or effusions. No acute bony abnormality. IMPRESSION: No active cardiopulmonary disease. Electronically Signed   By: Rolm Baptise M.D.   On: 05/22/2018 14:54    EKG None  Radiology Dg Chest 2 View  Result Date: 05/22/2018 CLINICAL DATA:  Cough, body aches EXAM: CHEST - 2 VIEW COMPARISON:  09/09/2016 FINDINGS: Heart and mediastinal contours are within normal limits. No focal opacities or effusions. No acute bony abnormality. IMPRESSION: No active cardiopulmonary disease. Electronically Signed   By: Rolm Baptise M.D.   On: 05/22/2018 14:54    Procedures Procedures (including critical care time)  Medications Ordered in ED Medications - No data to display   Initial Impression / Assessment and Plan / ED Course  I have reviewed the triage vital signs and the nursing notes.  Pertinent  labs & imaging results that were available during my care of the patient were reviewed by me and considered in my medical decision making (see chart for details).  Reviewed nursing notes and prior charts for additional history.  Reviewed prior u culture - sens to rocephin and keflex.   Pt requests flu test - ordered. Flu test neg.   cxr reviewed - no definite pna.   Given rhonchi, productive cough, ?atypical resp infxn, will give rx zithromax.   Pt breathing comfortably, and currently appears stable for d/c.     Final Clinical Impressions(s) / ED Diagnoses   Final diagnoses:  None    ED Discharge Orders    None       Lajean Saver, MD 05/22/18 1730

## 2019-03-23 ENCOUNTER — Ambulatory Visit: Payer: Self-pay

## 2019-03-23 ENCOUNTER — Encounter: Payer: Self-pay | Admitting: Orthopedic Surgery

## 2019-03-23 ENCOUNTER — Ambulatory Visit: Payer: Self-pay | Admitting: Orthopedic Surgery

## 2019-03-23 ENCOUNTER — Other Ambulatory Visit: Payer: Self-pay

## 2019-03-23 VITALS — BP 150/76 | HR 76 | Temp 97.2°F | Ht 67.5 in | Wt 155.0 lb

## 2019-03-23 DIAGNOSIS — M79671 Pain in right foot: Secondary | ICD-10-CM

## 2019-03-23 MED ORDER — ACETAMINOPHEN-CODEINE #3 300-30 MG PO TABS: 1.0000 | ORAL_TABLET | Freq: Four times a day (QID) | ORAL | 0 refills | Status: DC | PRN

## 2019-03-23 NOTE — Addendum Note (Signed)
Addended by: Carole Civil on: 03/23/2019 03:49 PM   Modules accepted: Orders

## 2019-03-23 NOTE — Addendum Note (Signed)
Addended byCandice Camp on: 03/23/2019 03:47 PM   Modules accepted: Orders

## 2019-03-23 NOTE — Progress Notes (Addendum)
Carol [name]  03/23/2019  HISTORY SECTION :  Chief Complaint  Patient presents with  . Foot Pain    right / skin turns white, has to sand skin, and burns at night/ aches    HPI The patient presents for evaluation of a problem with her right foot.  She has had significant callus formation under the outer aspect of the right small toe.  She says it burns it hurts she has had it for over a month.  The local foot care which includes removing the callus is not improving so she presents for evaluation Location right foot small toe Duration greater than 1 month Quality burning Severity severe Associated with callus formation  Review of Systems  All other systems reviewed and are negative.    Past Medical History:  Diagnosis Date  . Back pain   . Bronchitis   . Cervicalgia   . History of blood in urine    microscopic  . History of migraine headaches   . Impaired fasting glucose     Past Surgical History:  Procedure Laterality Date  . ABDOMINAL HYSTERECTOMY  2002  . CHOLECYSTECTOMY    . TUBAL LIGATION       Allergies  Allergen Reactions  . Naftin [Naftifine Hcl] Other (See Comments)    Peeling of skin, discoloration     Current Outpatient Medications:  .  ibuprofen (ADVIL,MOTRIN) 800 MG tablet, Take 1 tablet (800 mg total) by mouth 3 (three) times daily., Disp: 21 tablet, Rfl: 0   PHYSICAL EXAM SECTION: 1) BP (!) 150/76   Pulse 76   Ht 5' 7.5" (1.715 m)   Wt 155 lb (70.3 kg)   BMI 23.92 kg/m   Body mass index is 23.92 kg/m. General appearance: Well-developed well-nourished no gross deformities  2) Cardiovascular normal pulse and perfusion in all 4 extremities normal color without edema  3) Neurologically deep tendon reflexes are equal and normal, no sensation loss or deficits no pathologic reflexes  4) Psychological: Awake alert and oriented x3 mood and affect normal  5) Skin no lacerations or ulcerations no nodularity no palpable masses, no erythema or  nodularity  6) Musculoskeletal:   Overall foot alignment looks normal.  We do see the area in question years to be primarily the small toe plantar aspect with tenderness and hypersensitivity there she also has some prominent metatarsal heads 3 4 and 5 with early mild callus formation there.  Otherwise there is no atrophy in the foot range of motion of all the digits are normal.   MEDICAL DECISION SECTION:  Encounter Diagnosis  Name Primary?  . Pain in right foot Yes   Meds ordered this encounter  Medications  . DISCONTD: acetaminophen-codeine (TYLENOL #3) 300-30 MG tablet    Sig: Take 1 tablet by mouth every 6 (six) hours as needed for moderate pain.    Dispense:  30 tablet    Refill:  0  . acetaminophen-codeine (TYLENOL #3) 300-30 MG tablet    Sig: Take 1 tablet by mouth every 6 (six) hours as needed for moderate pain.    Dispense:  30 tablet    Refill:  0     Imaging Radiograph was obtained probable spur plantar aspect small toe see report  Plan:  (Rx., Inj., surg., Frx, MRI/CT, XR:2)  Referral will be made to podiatry of foot and ankle for management  3:35 PM Arther Abbott, MD  03/23/2019

## 2019-03-23 NOTE — Patient Instructions (Signed)

## 2019-03-31 ENCOUNTER — Ambulatory Visit (INDEPENDENT_AMBULATORY_CARE_PROVIDER_SITE_OTHER): Payer: Self-pay | Admitting: Orthopedic Surgery

## 2019-03-31 ENCOUNTER — Encounter: Payer: Self-pay | Admitting: Orthopedic Surgery

## 2019-03-31 VITALS — Ht 67.5 in | Wt 155.0 lb

## 2019-03-31 DIAGNOSIS — M79671 Pain in right foot: Secondary | ICD-10-CM

## 2019-04-16 ENCOUNTER — Encounter: Payer: Self-pay | Admitting: Orthopedic Surgery

## 2019-04-16 NOTE — Progress Notes (Signed)
Office Visit Note   Patient: Carol [name]           Date of Birth: November 01, 1965           MRN: [id number] Visit Date: 03/31/2019              Requested by: Carole Civil, [address] [place],  [place] [address] PCP: Patient, No Pcp Per  Chief Complaint  Patient presents with  . Right Foot - Pain      HPI: Patient is a 53 year old woman who is seen in referral from Dr. Aline Brochure.  Patient states that she has had foot pain for years primarily over the lateral aspect of the right fifth toe she states she has a small laceration she is concerned about the knot and hardness over the toe.  Patient states she is on her feet all day for work.  Assessment & Plan: Visit Diagnoses:  1. Pain in right foot     Plan: Discussed that she does have a hard corn from tight shoe wear.  Recommended a stiff soled wide sneaker to unload pressure from the little toe.  Do not feel she needs any surgical intervention.  Follow-Up Instructions: Return if symptoms worsen or fail to improve.   Ortho Exam  Patient is alert, oriented, no adenopathy, well-dressed, normal affect, normal respiratory effort. Examination patient has 10 degrees dorsiflexion of the ankle with the knee extended she has good subtalar motion she has good pulses she does have a hard corn over the dorsal lateral aspect of the right little toe PIP joint.  This was pared without complications.    Review of the radiographs she does have a small bony spur over the lateral aspect of the PIP joint of the little toe.  No periarticular cystic changes the joint space is congruent.  Imaging: No results found. No images are attached to the encounter.  Labs: Lab Results  Component Value Date   REPTSTATUS 05/21/2018 FINAL 05/18/2018   CULT >=100,000 COLONIES/mL ESCHERICHIA COLI (A) 05/18/2018   LABORGA ESCHERICHIA COLI (A) 05/18/2018     Lab Results  Component Value Date   ALBUMIN 4.2 01/30/2018   ALBUMIN 4.2 04/03/2017   ALBUMIN 3.8 09/09/2016    No results found for: MG No results found for: VD25OH  No results found for: PREALBUMIN CBC EXTENDED Latest Ref Rng & Units 05/20/2018 05/18/2018 01/30/2018  WBC 4.0 - 10.5 K/uL 10.0 10.4 9.6  RBC 3.87 - 5.11 MIL/uL 4.38 4.42 4.67  HGB 12.0 - 15.0 g/dL 12.9 13.2 13.9  HCT 36.0 - 46.0 % 37.2 37.5 39.6  PLT 150 - 400 K/uL 353 338 385  NEUTROABS 1.7 - 7.7 K/uL 6.2 6.4 -  LYMPHSABS 0.7 - 4.0 K/uL 3.1 3.2 -     Body mass index is 23.92 kg/m.  Orders:  No orders of the defined types were placed in this encounter.  No orders of the defined types were placed in this encounter.    Procedures: No procedures performed  Clinical Data: No additional findings.  ROS:  All other systems negative, except as noted in the HPI. Review of Systems  Objective: Vital Signs: Ht 5' 7.5" (1.715 m)   Wt 155 lb (70.3 kg)   BMI 23.92 kg/m   Specialty Comments:  No specialty comments available.  PMFS History: Patient Active Problem List   Diagnosis Date Noted  . 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 Back pain   .  Bronchitis   . Cervicalgia   . History of blood in urine    microscopic  . History of migraine headaches   . Impaired fasting glucose     Family History  Problem Relation Age of Onset  . Arthritis Paternal Grandmother   . Diabetes Paternal Grandmother   . Hypertension Paternal Grandmother   . Stroke Paternal Grandmother   . Diabetes Mother   . Colon cancer Neg Hx     Past Surgical History:  Procedure Laterality Date  . ABDOMINAL HYSTERECTOMY  2002  . CHOLECYSTECTOMY    . TUBAL LIGATION     Social History   Occupational History  . Occupation: Buyer, retail: [employer]  Tobacco Use  . Smoking status: Current Every Day Smoker    Packs/day: 0.50    Years: 20.00    Pack years: 10.00    Types: Cigarettes, Cigars  . Smokeless tobacco: Never Used  . Tobacco comment: Tobacco info  given  Substance and Sexual Activity  . Alcohol use: Yes    Comment: occ.  . Drug use: No  . Sexual activity: Yes

## 2019-05-03 ENCOUNTER — Emergency Department (HOSPITAL_COMMUNITY)
Admission: EM | Admit: 2019-05-03 | Discharge: 2019-05-03 | Disposition: A | Discharge status: Home / Self Care | Payer: Self-pay | Attending: Emergency Medicine | Admitting: Emergency Medicine

## 2019-05-03 ENCOUNTER — Encounter (HOSPITAL_COMMUNITY): Payer: Self-pay | Admitting: *Deleted

## 2019-05-03 ENCOUNTER — Other Ambulatory Visit: Payer: Self-pay

## 2019-05-03 ENCOUNTER — Emergency Department (HOSPITAL_COMMUNITY): Payer: Self-pay

## 2019-05-03 DIAGNOSIS — F17290 Nicotine dependence, other tobacco product, uncomplicated: Secondary | ICD-10-CM | POA: Insufficient documentation

## 2019-05-03 DIAGNOSIS — B070 Plantar wart: Secondary | ICD-10-CM | POA: Insufficient documentation

## 2019-05-03 DIAGNOSIS — M79672 Pain in left foot: Secondary | ICD-10-CM

## 2019-05-03 DIAGNOSIS — F17210 Nicotine dependence, cigarettes, uncomplicated: Secondary | ICD-10-CM | POA: Insufficient documentation

## 2019-05-03 LAB — CBC WITH DIFFERENTIAL/PLATELET
Abs Immature Granulocytes: 0.04 10*3/uL (ref 0.00–0.07)
Basophils Absolute: 0 10*3/uL (ref 0.0–0.1)
Basophils Relative: 0 %
Eosinophils Absolute: 0.1 10*3/uL (ref 0.0–0.5)
Eosinophils Relative: 1 %
HCT: 38.2 % (ref 36.0–46.0)
Hemoglobin: 12.8 g/dL (ref 12.0–15.0)
Immature Granulocytes: 0 %
Lymphocytes Relative: 37 %
Lymphs Abs: 3.6 10*3/uL (ref 0.7–4.0)
MCH: 29.1 pg (ref 26.0–34.0)
MCHC: 33.5 g/dL (ref 30.0–36.0)
MCV: 86.8 fL (ref 80.0–100.0)
Monocytes Absolute: 0.6 10*3/uL (ref 0.1–1.0)
Monocytes Relative: 6 %
Neutro Abs: 5.5 10*3/uL (ref 1.7–7.7)
Neutrophils Relative %: 56 %
Platelets: 356 10*3/uL (ref 150–400)
RBC: 4.4 MIL/uL (ref 3.87–5.11)
RDW: 11.9 % (ref 11.5–15.5)
WBC: 9.8 10*3/uL (ref 4.0–10.5)
nRBC: 0 % (ref 0.0–0.2)

## 2019-05-03 LAB — BASIC METABOLIC PANEL
Anion gap: 9 (ref 5–15)
BUN: 10 mg/dL (ref 6–20)
CO2: 27 mmol/L (ref 22–32)
Calcium: 8.9 mg/dL (ref 8.9–10.3)
Chloride: 102 mmol/L (ref 98–111)
Creatinine, Ser: 0.71 mg/dL (ref 0.44–1.00)
GFR calc Af Amer: >60 mL/min (ref >60)
GFR calc non Af Amer: >60 mL/min (ref >60)
Glucose, Bld: 238 mg/dL — ABNORMAL HIGH (ref 70–99)
Potassium: 3.4 mmol/L — ABNORMAL LOW (ref 3.5–5.1)
Sodium: 138 mmol/L (ref 135–145)

## 2019-05-03 MED ORDER — CEPHALEXIN 500 MG PO CAPS
500.0000 mg | ORAL_CAPSULE | Freq: Once | ORAL | Status: AC
  Administered 2019-05-03: 500 mg via ORAL
  Filled 2019-05-03: qty 1

## 2019-05-03 MED ORDER — LIDOCAINE HCL (PF) 1 % IJ SOLN
5.0000 mL | Freq: Once | INTRAMUSCULAR | Status: DC
  Filled 2019-05-03: qty 6

## 2019-05-03 MED ORDER — CEPHALEXIN 250 MG PO CAPS: 250.0000 mg | ORAL_CAPSULE | Freq: Four times a day (QID) | ORAL | 0 refills | Status: DC

## 2019-05-03 MED ORDER — ONDANSETRON HCL 4 MG PO TABS
4.0000 mg | ORAL_TABLET | Freq: Once | ORAL | Status: AC
  Administered 2019-05-03: 4 mg via ORAL
  Filled 2019-05-03: qty 1

## 2019-05-03 MED ORDER — SODIUM BICARBONATE 4.2 % IV SOLN
INTRAVENOUS | Status: AC
  Filled 2019-05-03: qty 10

## 2019-05-03 MED ORDER — HYDROCODONE-ACETAMINOPHEN 5-325 MG PO TABS
2.0000 | ORAL_TABLET | Freq: Once | ORAL | Status: AC
  Administered 2019-05-03: 2 via ORAL
  Filled 2019-05-03: qty 2

## 2019-05-03 MED ORDER — IBUPROFEN 400 MG PO TABS
400.0000 mg | ORAL_TABLET | Freq: Once | ORAL | Status: AC
Start: 2019-05-03 — End: 2019-05-03
  Administered 2019-05-03: 400 mg via ORAL
  Filled 2019-05-03: qty 1

## 2019-05-03 MED ORDER — SODIUM BICARBONATE 4 % IV SOLN
5.0000 mL | Freq: Once | INTRAVENOUS | Status: DC
  Filled 2019-05-03: qty 5

## 2019-05-03 MED ORDER — TRAMADOL HCL 50 MG PO TABS: 50.0000 mg | ORAL_TABLET | Freq: Four times a day (QID) | ORAL | 0 refills | Status: DC | PRN

## 2019-05-03 MED ORDER — SODIUM BICARBONATE 4.2 % IV SOLN: 5.0000 mL | Freq: Once | INTRAVENOUS | Status: DC

## 2019-05-03 MED ORDER — LIDOCAINE-PRILOCAINE 2.5-2.5 % EX CREA
TOPICAL_CREAM | Freq: Once | CUTANEOUS | Status: AC
  Administered 2019-05-03: 1 via TOPICAL
  Filled 2019-05-03: qty 5

## 2019-05-03 NOTE — ED Triage Notes (Signed)
Pain in bottom of left foot for 5 days,

## 2019-05-03 NOTE — ED Notes (Signed)
Radiology at bedside

## 2019-05-03 NOTE — ED Provider Notes (Signed)
[facility] EMERGENCY DEPARTMENT Provider Note   CSN: [id number] Arrival date & time: 05/03/19  1545     History   Chief Complaint Chief Complaint  Patient presents with  . Foot Pain    HPI Carol [name] is a 53 y.o. female.     Patient is a 53 year old female who presents to the emergency department with a complaint of left foot pain.  The patient states that she is on her feet for several hours every day.  She says that last week she noticed a raised area on the bottom of her foot.  She has been doing warm water soaks.  This is not been helping very much.  She now has an area that she says looks as though it has pus in it the raised area seems to be a little larger.  There is a increasing pain of the bottom of her foot.  She does not recall stepping on anything and does not recall anything break in the skin of her foot.  No recent operations or procedures on the left foot.  No known history of diabetes.  The patient states that now the pain keeps her up at night and is extremely painful during standing or walking.  She presents now requesting assistance with this issue.  It is of note that she has had problems with the right foot, including a spur on the right little toe, and a hard corn on the right foot.  The history is provided by the patient.  Foot Pain Pertinent negatives include no chest pain, no abdominal pain and no shortness of breath.    Past Medical History:  Diagnosis Date  . Back pain   . Bronchitis   . Cervicalgia   . History of blood in urine    microscopic  . History of migraine headaches   . Impaired fasting glucose     Patient Active Problem List   Diagnosis Date Noted  . 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 ABDOMINAL HYSTERECTOMY  2002  . CHOLECYSTECTOMY    . TUBAL LIGATION       OB History   No obstetric history on file.      Home Medications    Prior to Admission medications    Medication Sig Start Date End Date Taking? Authorizing Provider  acetaminophen-codeine (TYLENOL #3) 300-30 MG tablet Take 1 tablet by mouth every 6 (six) hours as needed for moderate pain. 03/23/19   Carole Civil, MD  ibuprofen (ADVIL,MOTRIN) 800 MG tablet Take 1 tablet (800 mg total) by mouth 3 (three) times daily. 05/18/18   Triplett, Lynelle Smoke, PA-C    Family History Family History  Problem Relation Age of Onset  . Arthritis Paternal Grandmother   . Diabetes Paternal Grandmother   . Hypertension Paternal Grandmother   . Stroke Paternal Grandmother   . Diabetes Mother   . Colon cancer Neg Hx     Social History Social History   Tobacco Use  . Smoking status: Current Every Day Smoker    Packs/day: 0.50    Years: 20.00    Pack years: 10.00    Types: Cigarettes, Cigars  . Smokeless tobacco: Never Used  . Tobacco comment: Tobacco info given  Substance Use Topics  . Alcohol use: Yes    Comment: occ.  . Drug use: No     Allergies   Latex and Naftin [naftifine hcl]   Review of Systems Review of  Systems  Constitutional: Negative for activity change and appetite change.  HENT: Negative for congestion, ear discharge, ear pain, facial swelling, nosebleeds, rhinorrhea, sneezing and tinnitus.   Eyes: Negative for photophobia, pain and discharge.  Respiratory: Negative for cough, choking, shortness of breath and wheezing.   Cardiovascular: Negative for chest pain, palpitations and leg swelling.  Gastrointestinal: Negative for abdominal pain, blood in stool, constipation, diarrhea, nausea and vomiting.  Genitourinary: Negative for difficulty urinating, dysuria, flank pain, frequency and hematuria.  Musculoskeletal: Negative for back pain, gait problem, myalgias and neck pain.       Foot pain  Skin: Negative for color change, rash and wound.  Neurological: Negative for dizziness, seizures, syncope, facial asymmetry, speech difficulty, weakness and numbness.  Hematological:  Negative for adenopathy. Does not bruise/bleed easily.  Psychiatric/Behavioral: Negative for agitation, confusion, hallucinations, self-injury and suicidal ideas. The patient is not nervous/anxious.      Physical Exam Updated Vital Signs BP (!) 156/60 (BP Location: Right Arm)   Pulse 82   Temp 98 F (36.7 C) (Oral)   Resp 20   Ht 5' 7.5" (1.715 m)   Wt 70 kg   SpO2 100%   BMI 23.81 kg/m   Physical Exam Vitals signs and nursing note reviewed.  Constitutional:      Appearance: She is well-developed. She is not toxic-appearing.  HENT:     Head: Normocephalic.     Right Ear: Tympanic membrane and external ear normal.     Left Ear: Tympanic membrane and external ear normal.  Eyes:     General: Lids are normal.     Pupils: Pupils are equal, round, and reactive to light.  Neck:     Musculoskeletal: Normal range of motion and neck supple.     Vascular: No carotid bruit.  Cardiovascular:     Rate and Rhythm: Normal rate and regular rhythm.     Pulses: Normal pulses.     Heart sounds: Normal heart sounds.  Pulmonary:     Effort: No respiratory distress.     Breath sounds: Normal breath sounds.  Abdominal:     General: Bowel sounds are normal.     Palpations: Abdomen is soft.     Tenderness: There is no abdominal tenderness. There is no guarding.  Musculoskeletal: Normal range of motion.       Feet:     Comments: Dorsalis pedis pulses 2+.  Capillary refill is less than 2 seconds.  Lymphadenopathy:     Head:     Right side of head: No submandibular adenopathy.     Left side of head: No submandibular adenopathy.     Cervical: No cervical adenopathy.  Skin:    General: Skin is warm and dry.  Neurological:     Mental Status: She is alert and oriented to person, place, and time.     Cranial Nerves: No cranial nerve deficit.     Sensory: No sensory deficit.  Psychiatric:        Speech: Speech normal.      ED Treatments / Results  Labs (all labs ordered are listed, but  only abnormal results are displayed) Labs Reviewed - No data to display  EKG None  Radiology No results found.  Procedures Procedures (including critical care time)  Medications Ordered in ED Medications  HYDROcodone-acetaminophen (NORCO/VICODIN) 5-325 MG per tablet 2 tablet (has no administration in time range)  ibuprofen (ADVIL) tablet 400 mg (has no administration in time range)  ondansetron (ZOFRAN) tablet 4 mg (  has no administration in time range)  lidocaine-prilocaine (EMLA) cream (has no administration in time range)     Initial Impression / Assessment and Plan / ED Course  I have reviewed the triage vital signs and the nursing notes.  Pertinent labs & imaging results that were available during my care of the patient were reviewed by me and considered in my medical decision making (see chart for details).          Final Clinical Impressions(s) / ED Diagnoses MDM  Vital signs are within normal limits with exception of the blood pressure being 156/60.  Pulse oximetry is 100% on room air.  Within normal limits by my interpretation.  Incision and drainage attempted at the foot area.  There is no drainage or pus like material present.  Suspect that the patient has a subcutaneous wart, or callus.  Will cover the patient with antibiotics in the event there is an infectious process going on.  Patient is to continue with warm soaks.  Patient will use Tylenol every 4 hours or ibuprofen every 6 hours for mild pain.  Prescription for 12 tablets of Ultram given to the patient.  Patient referral to Dr. Cannon Kettle- podiatry.   Final diagnoses:  Plantar warts  Foot pain, left    ED Discharge Orders         Ordered    traMADol (ULTRAM) 50 MG tablet  Every 6 hours PRN     05/03/19 1856    cephALEXin (KEFLEX) 250 MG capsule  4 times daily     05/03/19 1856           Lily Kocher, PA-C 05/03/19 1916    Milton Ferguson, MD 05/05/19 1212

## 2019-05-03 NOTE — Discharge Instructions (Addendum)
Your complete blood count is well within normal limits.  Your metabolic panel shows your glucose to be elevated at 238.  Your potassium is on the borderline of normal.  Please increase foods high in potassium.  Please see 1 of the local clinics for recheck of your glucose.  Please see Dr. Cannon Kettle or member of her team at [facility] in [place] concerning the growth on your foot.  Please use Keflex with breakfast, lunch, dinner, and at bedtime.  Use Tylenol every 4 hours or ibuprofen every 6 hours for mild pain.  Use Ultram for more severe pain.

## 2019-08-21 ENCOUNTER — Other Ambulatory Visit: Payer: Self-pay

## 2019-08-21 ENCOUNTER — Encounter (HOSPITAL_COMMUNITY): Payer: Self-pay | Admitting: Emergency Medicine

## 2019-08-21 ENCOUNTER — Emergency Department (HOSPITAL_COMMUNITY)
Admission: EM | Admit: 2019-08-21 | Discharge: 2019-08-21 | Disposition: A | Discharge status: Home / Self Care | Payer: Self-pay | Attending: Emergency Medicine | Admitting: Emergency Medicine

## 2019-08-21 DIAGNOSIS — Z5321 Procedure and treatment not carried out due to patient leaving prior to being seen by health care provider: Secondary | ICD-10-CM | POA: Insufficient documentation

## 2019-08-21 DIAGNOSIS — R109 Unspecified abdominal pain: Secondary | ICD-10-CM | POA: Insufficient documentation

## 2019-08-21 LAB — COMPREHENSIVE METABOLIC PANEL
ALT: 21 U/L (ref 0–44)
AST: 16 U/L (ref 15–41)
Albumin: 4 g/dL (ref 3.5–5.0)
Alkaline Phosphatase: 86 U/L (ref 38–126)
Anion gap: 10 (ref 5–15)
BUN: 10 mg/dL (ref 6–20)
CO2: 26 mmol/L (ref 22–32)
Calcium: 9.1 mg/dL (ref 8.9–10.3)
Chloride: 103 mmol/L (ref 98–111)
Creatinine, Ser: 0.62 mg/dL (ref 0.44–1.00)
GFR calc Af Amer: >60 mL/min (ref >60)
GFR calc non Af Amer: >60 mL/min (ref >60)
Glucose, Bld: 271 mg/dL — ABNORMAL HIGH (ref 70–99)
Potassium: 3.4 mmol/L — ABNORMAL LOW (ref 3.5–5.1)
Sodium: 139 mmol/L (ref 135–145)
Total Bilirubin: 0.2 mg/dL — ABNORMAL LOW (ref 0.3–1.2)
Total Protein: 7.7 g/dL (ref 6.5–8.1)

## 2019-08-21 LAB — CBC
HCT: 38 % (ref 36.0–46.0)
Hemoglobin: 13 g/dL (ref 12.0–15.0)
MCH: 29.5 pg (ref 26.0–34.0)
MCHC: 34.2 g/dL (ref 30.0–36.0)
MCV: 86.2 fL (ref 80.0–100.0)
Platelets: 367 10*3/uL (ref 150–400)
RBC: 4.41 MIL/uL (ref 3.87–5.11)
RDW: 12 % (ref 11.5–15.5)
WBC: 10.7 10*3/uL — ABNORMAL HIGH (ref 4.0–10.5)
nRBC: 0 % (ref 0.0–0.2)

## 2019-08-21 LAB — LIPASE, BLOOD: Lipase: 37 U/L (ref 11–51)

## 2019-08-21 MED ORDER — SODIUM CHLORIDE 0.9% FLUSH: 3.0000 mL | Freq: Once | INTRAVENOUS | Status: DC

## 2019-08-21 NOTE — ED Triage Notes (Signed)
Abdominal pain x2 days with nausea and no vomiting.

## 2019-08-22 ENCOUNTER — Encounter (HOSPITAL_COMMUNITY): Payer: Self-pay | Admitting: Emergency Medicine

## 2019-08-22 ENCOUNTER — Other Ambulatory Visit: Payer: Self-pay

## 2019-08-22 ENCOUNTER — Emergency Department (HOSPITAL_COMMUNITY)
Admission: EM | Admit: 2019-08-22 | Discharge: 2019-08-22 | Discharge status: Against Medical Advice | Payer: Self-pay | Attending: Emergency Medicine | Admitting: Emergency Medicine

## 2019-08-22 DIAGNOSIS — R1013 Epigastric pain: Secondary | ICD-10-CM | POA: Insufficient documentation

## 2019-08-22 DIAGNOSIS — F17210 Nicotine dependence, cigarettes, uncomplicated: Secondary | ICD-10-CM | POA: Insufficient documentation

## 2019-08-22 DIAGNOSIS — Z888 Allergy status to other drugs, medicaments and biological substances status: Secondary | ICD-10-CM | POA: Insufficient documentation

## 2019-08-22 DIAGNOSIS — Z91040 Latex allergy status: Secondary | ICD-10-CM | POA: Insufficient documentation

## 2019-08-22 DIAGNOSIS — E119 Type 2 diabetes mellitus without complications: Secondary | ICD-10-CM | POA: Insufficient documentation

## 2019-08-22 LAB — CBC
HCT: 41.2 % (ref 36.0–46.0)
Hemoglobin: 13.7 g/dL (ref 12.0–15.0)
MCH: 29 pg (ref 26.0–34.0)
MCHC: 33.3 g/dL (ref 30.0–36.0)
MCV: 87.1 fL (ref 80.0–100.0)
Platelets: 391 10*3/uL (ref 150–400)
RBC: 4.73 MIL/uL (ref 3.87–5.11)
RDW: 12 % (ref 11.5–15.5)
WBC: 9.6 10*3/uL (ref 4.0–10.5)
nRBC: 0 % (ref 0.0–0.2)

## 2019-08-22 LAB — COMPREHENSIVE METABOLIC PANEL
ALT: 21 U/L (ref 0–44)
AST: 14 U/L — ABNORMAL LOW (ref 15–41)
Albumin: 4.1 g/dL (ref 3.5–5.0)
Alkaline Phosphatase: 91 U/L (ref 38–126)
Anion gap: 10 (ref 5–15)
BUN: 9 mg/dL (ref 6–20)
CO2: 27 mmol/L (ref 22–32)
Calcium: 9 mg/dL (ref 8.9–10.3)
Chloride: 102 mmol/L (ref 98–111)
Creatinine, Ser: 0.62 mg/dL (ref 0.44–1.00)
GFR calc Af Amer: >60 mL/min (ref >60)
GFR calc non Af Amer: >60 mL/min (ref >60)
Glucose, Bld: 263 mg/dL — ABNORMAL HIGH (ref 70–99)
Potassium: 3.8 mmol/L (ref 3.5–5.1)
Sodium: 139 mmol/L (ref 135–145)
Total Bilirubin: 0.6 mg/dL (ref 0.3–1.2)
Total Protein: 7.9 g/dL (ref 6.5–8.1)

## 2019-08-22 LAB — URINALYSIS, ROUTINE W REFLEX MICROSCOPIC
Bacteria, UA: NONE SEEN
Bilirubin Urine: NEGATIVE
Glucose, UA: >=500 mg/dL — AB
Ketones, ur: NEGATIVE mg/dL
Leukocytes,Ua: NEGATIVE
Nitrite: NEGATIVE
Protein, ur: NEGATIVE mg/dL
Specific Gravity, Urine: 1.012 (ref 1.005–1.030)
pH: 7 (ref 5.0–8.0)

## 2019-08-22 LAB — LIPASE, BLOOD: Lipase: 43 U/L (ref 11–51)

## 2019-08-22 MED ORDER — ALUM & MAG HYDROXIDE-SIMETH 200-200-20 MG/5ML PO SUSP
30.0000 mL | Freq: Once | ORAL | Status: AC
  Administered 2019-08-22: 17:00:00 30 mL via ORAL
  Filled 2019-08-22: qty 30

## 2019-08-22 MED ORDER — FAMOTIDINE 20 MG PO TABS
40.0000 mg | ORAL_TABLET | Freq: Once | ORAL | Status: AC
  Administered 2019-08-22: 16:00:00 40 mg via ORAL
  Filled 2019-08-22: qty 2

## 2019-08-22 NOTE — ED Notes (Signed)
Call patient twice from the waiting room with no answer

## 2019-08-22 NOTE — Discharge Instructions (Signed)
Please avoid very spicy, fatty, or acidic foods that might make your symptoms worse Take Omeprazole once daily for the next month to see if this improves your symptoms Try to cut back on smoking which can make symptoms worse as well Avoid any alcohol or NSAIDs (Aspirin, Ibuprofen, Naproxen, Aleve). Tylenol is ok to take Return if worsening

## 2019-08-22 NOTE — ED Triage Notes (Addendum)
Pt reports epigastric pain x3 days. Pt reports had blood work drawn last night but left before seeing a provider. Pt reports intermittent burning sensation in upper abdomen with pain onset. Pt denies pain at this time.pt denies n/v/d.

## 2019-08-22 NOTE — ED Notes (Signed)
Patient states she was in the restroom when called. Waiting for next available bed.

## 2019-08-22 NOTE — ED Provider Notes (Signed)
[facility] EMERGENCY DEPARTMENT Provider Note   CSN: [id number] Arrival date & time: 08/22/19  [id number]     History Chief Complaint  Patient presents with  . Abdominal Pain    Carol [name] is a 54 y.o. female who presents with abdominal pain.  Patient states that she has been having epigastric abdominal pain for the past 3 days.  The pain is constant and feels like a "ball" in the area with very hard at times when the pain is severe.  Eating spicy chicken wings seems to make the pain worse.  Drinking cold water makes the pain better.  She has not had these symptoms before.  She had to go home from work yesterday because she was having so much pain but left without being seen last night due to long wait times.  She came back to the ED today because of ongoing symptoms.  She has been taking over-the-counter generic Excedrin for her symptoms which temporarily makes her symptoms better but then the pain comes back.  She is concerned she has possible ulcers.  She denies any blood in the stool.  She denies fever, chills, chest pain, shortness of breath, nausea, vomiting, diarrhea.  She has not been drinking alcohol.  She has never seen a GI doctor and does not currently have a primary care provider. Past surgical hx significant for cholecystectomy and hysterectomy  HPI     Past Medical History:  Diagnosis Date  . Back pain   . Bronchitis   . Cervicalgia   . History of blood in urine    microscopic  . History of migraine headaches   . Impaired fasting glucose     Patient Active Problem List   Diagnosis Date Noted  . 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 ABDOMINAL HYSTERECTOMY  2002  . CHOLECYSTECTOMY    . TUBAL LIGATION       OB History   No obstetric history on file.     Family History  Problem Relation Age of Onset  . Arthritis Paternal Grandmother   . Diabetes Paternal Grandmother   . Hypertension Paternal Grandmother   .  Stroke Paternal Grandmother   . Diabetes Mother   . Colon cancer Neg Hx     Social History   Tobacco Use  . Smoking status: Current Every Day Smoker    Packs/day: 0.50    Years: 20.00    Pack years: 10.00    Types: Cigarettes, Cigars  . Smokeless tobacco: Never Used  . Tobacco comment: Tobacco info given  Substance Use Topics  . Alcohol use: Yes    Comment: occ.  . Drug use: No    Home Medications Prior to Admission medications   Medication Sig Start Date End Date Taking? Authorizing Provider  acetaminophen-codeine (TYLENOL #3) 300-30 MG tablet Take 1 tablet by mouth every 6 (six) hours as needed for moderate pain. Patient not taking: Reported on 05/03/2019 03/23/19   Carole Civil, MD  cephALEXin (KEFLEX) 250 MG capsule Take 1 capsule (250 mg total) by mouth 4 (four) times daily. 05/03/19   Lily Kocher, PA-C  naproxen sodium (ALEVE) 220 MG tablet Take 220-440 mg by mouth daily as needed (for pain).    [provider]  traMADol (ULTRAM) 50 MG tablet Take 1 tablet (50 mg total) by mouth every 6 (six) hours as needed. 05/03/19   Lily Kocher, PA-C    Allergies    Latex  and Naftin [naftifine hcl]  Review of Systems   Review of Systems  Constitutional: Negative for chills and fever.  Respiratory: Negative for shortness of breath.   Cardiovascular: Negative for chest pain.  Gastrointestinal: Positive for abdominal pain. Negative for blood in stool, diarrhea, nausea and vomiting.  Genitourinary: Negative for dysuria.  All other systems reviewed and are negative.   Physical Exam Updated Vital Signs BP (!) 159/118 (BP Location: Right Arm)   Pulse 81   Temp 98.3 F (36.8 C) (Oral)   Resp 16   Ht 5' 7.5" (1.715 m)   Wt 68 kg   SpO2 100%   BMI 23.15 kg/m   Physical Exam Vitals and nursing note reviewed.  Constitutional:      General: She is not in acute distress.    Appearance: She is well-developed. She is not ill-appearing.  HENT:     Head:  Normocephalic and atraumatic.  Eyes:     General: No scleral icterus.       Right eye: No discharge.        Left eye: No discharge.     Conjunctiva/sclera: Conjunctivae normal.     Pupils: Pupils are equal, round, and reactive to light.  Cardiovascular:     Rate and Rhythm: Normal rate and regular rhythm.  Pulmonary:     Effort: Pulmonary effort is normal. No respiratory distress.     Breath sounds: Normal breath sounds.  Abdominal:     General: Abdomen is flat. Bowel sounds are normal. There is no distension.     Palpations: Abdomen is soft.     Tenderness: There is abdominal tenderness in the epigastric area.     Comments: Surgical scar from cholecysectomy  Musculoskeletal:     Cervical back: Normal range of motion.  Skin:    General: Skin is warm and dry.  Neurological:     Mental Status: She is alert and oriented to person, place, and time.  Psychiatric:        Behavior: Behavior normal.     ED Results / Procedures / Treatments   Labs (all labs ordered are listed, but only abnormal results are displayed) Labs Reviewed  COMPREHENSIVE METABOLIC PANEL - Abnormal; Notable for the following components:      Result Value   Glucose, Bld 263 (*)    AST 14 (*)    All other components within normal limits  URINALYSIS, ROUTINE W REFLEX MICROSCOPIC - Abnormal; Notable for the following components:   Glucose, UA >=500 (*)    Hgb urine dipstick MODERATE (*)    All other components within normal limits  LIPASE, BLOOD  CBC    EKG None  Radiology No results found.  Procedures Procedures (including critical care time)  Medications Ordered in ED Medications  alum & mag hydroxide-simeth (MAALOX/MYLANTA) 200-200-20 MG/5ML suspension 30 mL (has no administration in time range)  famotidine (PEPCID) tablet 40 mg (has no administration in time range)    ED Course  I have reviewed the triage vital signs and the nursing notes.  Pertinent labs & imaging results that were  available during my care of the patient were reviewed by me and considered in my medical decision making (see chart for details).  54 year old female with epigastric abdominal pain for the past 3 days.  Heart is regular rate and rhythm.  Lungs are clear to auscultation.  She is tender in the epigastric area. DDx: GERD/gastritis, PUD, pancreatitis, gastroparesis.  Gallbladder was removed so doubt biliary pathology. CBC  is normal.  CMP is remarkable for hyperglycemia.  She has had multiple elevated glucoses over the past several years.  She denies a diagnosis of diabetes although states that she does not have a primary care doctor.  Symptoms sound consistent with gastritis versus peptic ulcer disease.  She has prior diagnosis of possible pancreatitis however lipase is normal today. She has no N/V. We will start her on a scheduled PPI and advised follow-up with a primary care provider.  Dietary modifications were recommended.  She verbalized understanding  MDM Rules/Calculators/A&P                       Final Clinical Impression(s) / ED Diagnoses Final diagnoses:  Epigastric abdominal pain    Rx / DC Orders ED Discharge Orders    None       Recardo Evangelist, PA-C 08/22/19 1634    Margette Fast, MD 08/25/19 1331

## 2020-01-23 ENCOUNTER — Emergency Department (HOSPITAL_COMMUNITY)
Admission: EM | Admit: 2020-01-23 | Discharge: 2020-01-23 | Disposition: A | Discharge status: Home / Self Care | Payer: Self-pay | Attending: Emergency Medicine | Admitting: Emergency Medicine

## 2020-01-23 ENCOUNTER — Other Ambulatory Visit: Payer: Self-pay

## 2020-01-23 ENCOUNTER — Encounter (HOSPITAL_COMMUNITY): Payer: Self-pay | Admitting: *Deleted

## 2020-01-23 ENCOUNTER — Emergency Department (HOSPITAL_COMMUNITY): Payer: Self-pay

## 2020-01-23 DIAGNOSIS — Y93F2 Activity, caregiving, lifting: Secondary | ICD-10-CM | POA: Insufficient documentation

## 2020-01-23 DIAGNOSIS — Z91040 Latex allergy status: Secondary | ICD-10-CM | POA: Insufficient documentation

## 2020-01-23 DIAGNOSIS — Y92512 Supermarket, store or market as the place of occurrence of the external cause: Secondary | ICD-10-CM | POA: Insufficient documentation

## 2020-01-23 DIAGNOSIS — Y990 Civilian activity done for income or pay: Secondary | ICD-10-CM | POA: Insufficient documentation

## 2020-01-23 DIAGNOSIS — F17210 Nicotine dependence, cigarettes, uncomplicated: Secondary | ICD-10-CM | POA: Insufficient documentation

## 2020-01-23 DIAGNOSIS — S56912A Strain of unspecified muscles, fascia and tendons at forearm level, left arm, initial encounter: Secondary | ICD-10-CM | POA: Insufficient documentation

## 2020-01-23 DIAGNOSIS — X500XXA Overexertion from strenuous movement or load, initial encounter: Secondary | ICD-10-CM | POA: Insufficient documentation

## 2020-01-23 MED ORDER — CYCLOBENZAPRINE HCL 5 MG PO TABS: 5.0000 mg | ORAL_TABLET | Freq: Three times a day (TID) | ORAL | 0 refills | Status: DC | PRN

## 2020-01-23 MED ORDER — IBUPROFEN 600 MG PO TABS: 600.0000 mg | ORAL_TABLET | Freq: Four times a day (QID) | ORAL | 0 refills | Status: DC | PRN

## 2020-01-23 NOTE — ED Provider Notes (Signed)
[facility] Provider Note   CSN: [id number] Arrival date & time: 01/23/20  1429     History Chief Complaint  Patient presents with  . Arm Pain    Carol [name] is a 54 y.o. 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  She was lifting a heavy box and had sudden onset of pain from the left elbow region radiating into her mid forearm.  Her pain is better at this time, but still has discomfort with movement and palpation.  She has had no treatments prior to arrival.  Denies prior injury to the site, denies weakness or numbness in the extremity.  The history is provided by the patient.       Past Medical History:  Diagnosis Date  . Back pain   . Bronchitis   . Cervicalgia   . History of blood in urine    microscopic  . History of migraine headaches   . Impaired fasting glucose     Patient Active Problem List   Diagnosis Date Noted  . 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 ABDOMINAL HYSTERECTOMY  2002  . CHOLECYSTECTOMY    . TUBAL LIGATION       OB History   No obstetric history on file.     Family History  Problem Relation Age of Onset  . Arthritis Paternal Grandmother   . Diabetes Paternal Grandmother   . Hypertension Paternal Grandmother   . Stroke Paternal Grandmother   . Diabetes Mother   . Colon cancer Neg Hx     Social History   Tobacco Use  . Smoking status: Current Every Day Smoker    Packs/day: 0.50    Years: 20.00    Pack years: 10.00    Types: Cigarettes, Cigars  . Smokeless tobacco: Never Used  . Tobacco comment: Tobacco info given  Substance Use Topics  . Alcohol use: Yes    Comment: occ.  . Drug use: No    Home Medications Prior to Admission medications   Medication Sig Start Date End Date Taking?  Authorizing Provider  acetaminophen-codeine (TYLENOL #3) 300-30 MG tablet Take 1 tablet by mouth every 6 (six) hours as needed for moderate pain. Patient not taking: Reported on 05/03/2019 03/23/19   Carole Civil, MD  cephALEXin (KEFLEX) 250 MG capsule Take 1 capsule (250 mg total) by mouth 4 (four) times daily. 05/03/19   Lily Kocher, PA-C  cyclobenzaprine (FLEXERIL) 5 MG tablet Take 1 tablet (5 mg total) by mouth 3 (three) times daily as needed for muscle spasms. 01/23/20   Evalee Jefferson, PA-C  ibuprofen (ADVIL) 600 MG tablet Take 1 tablet (600 mg total) by mouth every 6 (six) hours as needed. 01/23/20   Evalee Jefferson, PA-C  naproxen sodium (ALEVE) 220 MG tablet Take 220-440 mg by mouth daily as needed (for pain).    [provider]  traMADol (ULTRAM) 50 MG tablet Take 1 tablet (50 mg total) by mouth every 6 (six) hours as needed. 05/03/19   Lily Kocher, PA-C    Allergies    Latex and Naftin [naftifine hcl]  Review of Systems   Review of Systems  Constitutional: Negative for fever.  Musculoskeletal: Positive for arthralgias. Negative for joint swelling and myalgias.  Neurological: Negative for weakness and  numbness.    Physical Exam Updated Vital Signs BP (!) 176/89 (BP Location: Right Arm)   Pulse 89   Temp 98.3 F (36.8 C) (Oral)   Resp 16   Ht 5\' 7"  (1.702 m)   Wt 104.3 kg   SpO2 100%   BMI 36.02 kg/m   Physical Exam Vitals reviewed.  Constitutional:      Appearance: She is well-developed.  HENT:     Head: Atraumatic.  Cardiovascular:     Comments: Pulses equal bilaterally Musculoskeletal:        General: Tenderness present.     Left forearm: Tenderness present. No swelling, edema or deformity.     Cervical back: Normal range of motion.     Comments: Mild tenderness to palpation volar mid left forearm.  Compartments are soft.  She has mild pain with resisted bicep curl at the site.  No pain with wrist flexion or extension nor with pronation or supination.   Radial pulses intact, equal grip strengths.  Less than 2-second cap refill in fingertips.  Skin:    General: Skin is warm and dry.  Neurological:     Mental Status: She is alert.     Sensory: No sensory deficit.     Deep Tendon Reflexes: Reflexes normal.     ED Results / Procedures / Treatments   Labs (all labs ordered are listed, but only abnormal results are displayed) Labs Reviewed - No data to display  EKG None  Radiology DG Forearm Left  Result Date: 01/23/2020 CLINICAL DATA:  Pain after  unloading truck EXAM: LEFT FOREARM - 2 VIEW COMPARISON:  None. FINDINGS: There is no evidence of fracture or other focal bone lesions. Soft tissues are unremarkable. IMPRESSION: Negative. Electronically Signed   By: Macy Mis M.D.   On: 01/23/2020 15:26    Procedures Procedures (including critical care time)  Medications Ordered in ED Medications - No data to display  ED Course  I have reviewed the triage vital signs and the nursing notes.  Pertinent labs & imaging results that were available during my care of the patient were reviewed by me and considered in my medical decision making (see chart for details).    MDM Rules/Calculators/A&P                      Imaging reviewed and discussed with patient.  No bony injury or dislocation.  Her forearm is soft, doubt this represents a compartment syndrome.  Discussed home treatment including rest, ice, provided Ace wrap for gentle compression.  Ibuprofen and Flexeril prescribed.  Referral to orthopedics if symptoms persist, worsen or not resolved over the next 10 to 14 days. Final Clinical Impression(s) / ED Diagnoses Final diagnoses:  Forearm strain, left, initial encounter    Rx / DC Orders ED Discharge Orders         Ordered    ibuprofen (ADVIL) 600 MG tablet  Every 6 hours PRN     01/23/20 1610    cyclobenzaprine (FLEXERIL) 5 MG tablet  3 times daily PRN     01/23/20 1610           Evalee Jefferson, PA-C 01/23/20 1617     Fredia Sorrow, MD 02/06/20 2329

## 2020-01-23 NOTE — Discharge Instructions (Signed)
As discussed your x-rays are normal.  Your exam suggest that you have a strain of the muscles in your forearm.  I recommend using ice packs as much as is comfortable for the next 48 hours.  Starting on Wednesday you can also add a heating pad for 15 minutes several times daily.  You may continue to use the Ace wrap if it provides comfort.  You have been prescribed a medication to help you with your symptoms.  I recommend reevaluation by orthopedics if your symptoms are not improving with this treatment plan over the next 10 to 14 days.  Call Dr. Mardelle Matte as needed.

## 2020-01-23 NOTE — ED Triage Notes (Signed)
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  Pt not able to grip with left hand or lift anything up.

## 2020-02-18 ENCOUNTER — Other Ambulatory Visit: Payer: Self-pay

## 2020-02-18 ENCOUNTER — Encounter (HOSPITAL_COMMUNITY): Payer: Self-pay | Admitting: Emergency Medicine

## 2020-02-18 ENCOUNTER — Emergency Department (HOSPITAL_COMMUNITY)
Admission: EM | Admit: 2020-02-18 | Discharge: 2020-02-18 | Disposition: A | Discharge status: Home / Self Care | Payer: Self-pay | Attending: Emergency Medicine | Admitting: Emergency Medicine

## 2020-02-18 DIAGNOSIS — B070 Plantar wart: Secondary | ICD-10-CM | POA: Insufficient documentation

## 2020-02-18 DIAGNOSIS — Z91040 Latex allergy status: Secondary | ICD-10-CM | POA: Insufficient documentation

## 2020-02-18 DIAGNOSIS — F17210 Nicotine dependence, cigarettes, uncomplicated: Secondary | ICD-10-CM | POA: Insufficient documentation

## 2020-02-18 NOTE — ED Provider Notes (Signed)
[facility] EMERGENCY DEPARTMENT Provider Note   CSN: [id number] Arrival date & time: 02/18/20  1605     History Chief Complaint  Patient presents with  . Foot Pain    Carol [name] is a 54 y.o. female with a past medical history of plantar warts, impaired fasting glucose, who presents today for evaluation of a sore on the bottom of her left foot the past 3 days.  She states that last night she did attempt to file it down and put Biofreeze on it without significant relief.  She states that she has had similar however believes it was on the other foot.  Chart review shows that in September 2020 she was seen for plantar warts on the bottom of her left foot.  Based on review of notes from that appointment it appears that her lesion is in the same spot on the same foot today.  She denies any possibility of puncture or foreign body.  No fevers.  She is able to ambulate however reports pain localized in the area with ambulation.  No trauma.    HPI     Past Medical History:  Diagnosis Date  . Back pain   . Bronchitis   . Cervicalgia   . History of blood in urine    microscopic  . History of migraine headaches   . Impaired fasting glucose     Patient Active Problem List   Diagnosis Date Noted  . 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 ABDOMINAL HYSTERECTOMY  2002  . CHOLECYSTECTOMY    . TUBAL LIGATION       OB History   No obstetric history on file.     Family History  Problem Relation Age of Onset  . Arthritis Paternal Grandmother   . Diabetes Paternal Grandmother   . Hypertension Paternal Grandmother   . Stroke Paternal Grandmother   . Diabetes Mother   . Colon cancer Neg Hx     Social History   Tobacco Use  . Smoking status: Current Every Day Smoker    Packs/day: 0.50    Years: 20.00    Pack years: 10.00    Types: Cigarettes, Cigars  . Smokeless tobacco: Never Used  . Tobacco comment: Tobacco info given    Vaping Use  . Vaping Use: Never used  Substance Use Topics  . Alcohol use: Yes    Comment: occ.  . Drug use: No    Home Medications Prior to Admission medications   Medication Sig Start Date End Date Taking? Authorizing Provider  acetaminophen-codeine (TYLENOL #3) 300-30 MG tablet Take 1 tablet by mouth every 6 (six) hours as needed for moderate pain. Patient not taking: Reported on 05/03/2019 03/23/19   Carole Civil, MD  cephALEXin (KEFLEX) 250 MG capsule Take 1 capsule (250 mg total) by mouth 4 (four) times daily. 05/03/19   Lily Kocher, PA-C  cyclobenzaprine (FLEXERIL) 5 MG tablet Take 1 tablet (5 mg total) by mouth 3 (three) times daily as needed for muscle spasms. 01/23/20   Evalee Jefferson, PA-C  ibuprofen (ADVIL) 600 MG tablet Take 1 tablet (600 mg total) by mouth every 6 (six) hours as needed. 01/23/20   Evalee Jefferson, PA-C  naproxen sodium (ALEVE) 220 MG tablet Take 220-440 mg by mouth daily as needed (for pain).    [provider]  traMADol (ULTRAM) 50 MG tablet Take 1 tablet (50 mg total) by mouth every 6 (six) hours as  needed. 05/03/19   Lily Kocher, PA-C    Allergies    Latex and Naftin [naftifine hcl]  Review of Systems   Review of Systems  Constitutional: Negative for chills and fever.  Musculoskeletal: Negative for back pain.  Skin: Positive for wound.  Allergic/Immunologic: Negative for immunocompromised state.  All other systems reviewed and are negative.   Physical Exam Updated Vital Signs BP (!) 168/87   Pulse 85   Temp 98.6 F (37 C) (Oral)   Resp 19   Ht 5\' 7"  (1.702 m)   Wt 74.8 kg   SpO2 98%   BMI 25.84 kg/m   Physical Exam Vitals and nursing note reviewed.  Constitutional:      General: She is not in acute distress.    Appearance: She is not ill-appearing.  HENT:     Head: Normocephalic.  Cardiovascular:     Rate and Rhythm: Normal rate.     Pulses: Normal pulses.  Pulmonary:     Effort: Pulmonary effort is normal. No  respiratory distress.  Musculoskeletal:     Comments: No significant edema of the left foot.  Skin:    Comments: On the plantar surface of the left foot there is a approximately 1 cm area of pale skin with black spots in the middle. No surrounding induration, erythema or fluctuance. Clinically areas consistent with a plantars wart  Neurological:     Mental Status: She is alert.     Comments: Sensation intact to light touch to left foot.  Psychiatric:        Mood and Affect: Mood normal.     ED Results / Procedures / Treatments   Labs (all labs ordered are listed, but only abnormal results are displayed) Labs Reviewed - No data to display  EKG None  Radiology No results found.  Procedures Procedures (including critical care time)  Medications Ordered in ED Medications - No data to display  ED Course  I have reviewed the triage vital signs and the nursing notes.  Pertinent labs & imaging results that were available during my care of the patient were reviewed by me and considered in my medical decision making (see chart for details).    MDM Rules/Calculators/A&P                         Patient presents today for evaluation of a skin abnormality on the bottom of her left foot. Chart review shows she appears to have been seen and diagnosed with a plantars wart in the same location. Clinically today her exam is consistent with a plantars wart, no evidence of secondary bacterial infection at this time. Recommended outpatient follow-up including podiatry/PCP. Recommended techniques for offloading to decrease pain. Additionally recommended Tylenol. Patient does not have known diabetes., Appears safe for outpatient follow-up at this time.  Of note patient's blood pressure was elevated while in the emergency room. She is informed of this and the need to get this rechecked in the next 2 weeks.  Return precautions were discussed with patient who states their understanding.  At the time  of discharge patient denied any unaddressed complaints or concerns.  Patient is agreeable for discharge home.  Note: Portions of this report may have been transcribed using voice recognition software. Every effort was made to ensure accuracy; however, inadvertent computerized transcription errors may be present  Final Clinical Impression(s) / ED Diagnoses Final diagnoses:  Plantar wart    Rx / DC Orders ED Discharge  Orders    None       Ollen Gross 02/18/20 1807    Wyvonnia Dusky, MD 02/18/20 [phone]

## 2020-02-18 NOTE — ED Triage Notes (Signed)
Pt c/o of left foot pain. States a "bump" on the bottom of the foot causing pain since Wednesday

## 2020-02-18 NOTE — Discharge Instructions (Addendum)
Please schedule a follow-up appointment with either your primary care doctor or a podiatrist for further evaluation.     While in the emergency room your blood pressure is elevated.  You need to get this rechecked in the next 2 weeks with your primary care doctor as if it remains elevated you may need medication for high blood pressure.  If your blood pressure is consistently high while you do not feel that it is damaging many of your important organs such as your brain, eyes, heart and kidneys.  Please monitor your sore on the bottom of your foot.  At this time it appears to be a wart.  You can get doughnut cushions from the drugstore to help take the pressure off it when you walk.  If you develop significant changes including redness/swelling (which will happened when it gets treated) or have other concerns please seek additional medical care and evaluation.  Please take Tylenol (acetaminophen) to relieve your pain.  You may take tylenol, up to 1,000 mg (two extra strength pills).  Do not take more than 3,000 mg tylenol in a 24 hour period.  Please check all medication labels as many medications such as pain and cold medications may contain tylenol. Please do not drink alcohol while taking this medication.

## 2020-04-11 ENCOUNTER — Other Ambulatory Visit: Payer: Self-pay | Admitting: Podiatry

## 2020-05-04 ENCOUNTER — Emergency Department (HOSPITAL_COMMUNITY): Admission: EM | Admit: 2020-05-04 | Discharge: 2020-05-04 | Discharge status: Against Medical Advice | Payer: Self-pay

## 2020-05-04 ENCOUNTER — Other Ambulatory Visit: Payer: Self-pay

## 2020-05-08 NOTE — Patient Instructions (Signed)
Your procedure is scheduled on: 05/16/2020  Report to [facility] at 8:00    AM.  Call this number if you have problems the morning of surgery: [phone]   Remember:   Do not Eat or Drink after midnight         No Smoking the morning of surgery  :  Take these medicines the morning of surgery with A SIP OF WATER: Tramadol if needed  Do not wear jewelry, make-up or nail polish.  Do not wear lotions, powders, or perfumes. You may wear deodorant.  Do not shave 48 hours prior to surgery. Men may shave face and neck.  Do not bring valuables to the hospital.  Contacts, dentures or bridgework may not be worn into surgery.  Leave suitcase in the car. After surgery it may be brought to your room.  For patients admitted to the hospital, checkout time is 11:00 AM the day of discharge.   Patients discharged the day of surgery will not be allowed to drive home.    Special Instructions: Shower using CHG night before surgery and shower the day of surgery use CHG.  Use special wash - you have one bottle of CHG for all showers.  You should use approximately 1/2 of the bottle for each shower.  How to Use Chlorhexidine for Bathing Chlorhexidine gluconate (CHG) is a germ-killing (antiseptic) solution that is used to clean the skin. It can get rid of the bacteria that normally live on the skin and can keep them away for about 24 hours. To clean your skin with CHG, you may be given:  A CHG solution to use in the shower or as part of a sponge bath.  A prepackaged cloth that contains CHG. Cleaning your skin with CHG may help lower the risk for infection:  While you are staying in the intensive care unit of the hospital.  If you have a vascular access, such as a central line, to provide short-term or long-term access to your veins.  If you have a catheter to drain urine from your bladder.  If you are on a ventilator. A ventilator is a machine that helps you breathe by moving air in and out of your  lungs.  After surgery. What are the risks? Risks of using CHG include:  A skin reaction.  Hearing loss, if CHG gets in your ears.  Eye injury, if CHG gets in your eyes and is not rinsed out.  The CHG product catching fire. Make sure that you avoid smoking and flames after applying CHG to your skin. Do not use CHG:  If you have a chlorhexidine allergy or have previously reacted to chlorhexidine.  On babies younger than 75 months of age. How to use CHG solution  Use CHG only as told by your health care provider, and follow the instructions on the label.  Use the full amount of CHG as directed. Usually, this is one bottle. During a shower Follow these steps when using CHG solution during a shower (unless your health care provider gives you different instructions): 1. Start the shower. 2. Use your normal soap and shampoo to wash your face and hair. 3. Turn off the shower or move out of the shower stream. 4. Pour the CHG onto a clean washcloth. Do not use any type of brush or rough-edged sponge. 5. Starting at your neck, lather your body down to your toes. Make sure you follow these instructions: ? If you will be having surgery, pay special  attention to the part of your body where you will be having surgery. Scrub this area for at least 1 minute. ? Do not use CHG on your head or face. If the solution gets into your ears or eyes, rinse them well with water. ? Avoid your genital area. ? Avoid any areas of skin that have broken skin, cuts, or scrapes. ? Scrub your back and under your arms. Make sure to wash skin folds. 6. Let the lather sit on your skin for 1-2 minutes or as long as told by your health care provider. 7. Thoroughly rinse your entire body in the shower. Make sure that all body creases and crevices are rinsed well. 8. Dry off with a clean towel. Do not put any substances on your body afterward--such as powder, lotion, or perfume--unless you are told to do so by your health  care provider. Only use lotions that are recommended by the manufacturer. 9. Put on clean clothes or pajamas. 10. If it is the night before your surgery, sleep in clean sheets.  During a sponge bath Follow these steps when using CHG solution during a sponge bath (unless your health care provider gives you different instructions): 1. Use your normal soap and shampoo to wash your face and hair. 2. Pour the CHG onto a clean washcloth. 3. Starting at your neck, lather your body down to your toes. Make sure you follow these instructions: ? If you will be having surgery, pay special attention to the part of your body where you will be having surgery. Scrub this area for at least 1 minute. ? Do not use CHG on your head or face. If the solution gets into your ears or eyes, rinse them well with water. ? Avoid your genital area. ? Avoid any areas of skin that have broken skin, cuts, or scrapes. ? Scrub your back and under your arms. Make sure to wash skin folds. 4. Let the lather sit on your skin for 1-2 minutes or as long as told by your health care provider. 5. Using a different clean, wet washcloth, thoroughly rinse your entire body. Make sure that all body creases and crevices are rinsed well. 6. Dry off with a clean towel. Do not put any substances on your body afterward--such as powder, lotion, or perfume--unless you are told to do so by your health care provider. Only use lotions that are recommended by the manufacturer. 7. Put on clean clothes or pajamas. 8. If it is the night before your surgery, sleep in clean sheets. How to use CHG prepackaged cloths  Only use CHG cloths as told by your health care provider, and follow the instructions on the label.  Use the CHG cloth on clean, dry skin.  Do not use the CHG cloth on your head or face unless your health care provider tells you to.  When washing with the CHG cloth: ? Avoid your genital area. ? Avoid any areas of skin that have broken  skin, cuts, or scrapes. Before surgery Follow these steps when using a CHG cloth to clean before surgery (unless your health care provider gives you different instructions): 1. Using the CHG cloth, vigorously scrub the part of your body where you will be having surgery. Scrub using a back-and-forth motion for 3 minutes. The area on your body should be completely wet with CHG when you are done scrubbing. 2. Do not rinse. Discard the cloth and let the area air-dry. Do not put any substances on the area  afterward, such as powder, lotion, or perfume. 3. Put on clean clothes or pajamas. 4. If it is the night before your surgery, sleep in clean sheets.  For general bathing Follow these steps when using CHG cloths for general bathing (unless your health care provider gives you different instructions). 1. Use a separate CHG cloth for each area of your body. Make sure you wash between any folds of skin and between your fingers and toes. Wash your body in the following order, switching to a new cloth after each step: ? The front of your neck, shoulders, and chest. ? Both of your arms, under your arms, and your hands. ? Your stomach and groin area, avoiding the genitals. ? Your right leg and foot. ? Your left leg and foot. ? The back of your neck, your back, and your buttocks. 2. Do not rinse. Discard the cloth and let the area air-dry. Do not put any substances on your body afterward--such as powder, lotion, or perfume--unless you are told to do so by your health care provider. Only use lotions that are recommended by the manufacturer. 3. Put on clean clothes or pajamas. Contact a health care provider if:  Your skin gets irritated after scrubbing.  You have questions about using your solution or cloth. Get help right away if:  Your eyes become very red or swollen.  Your eyes itch badly.  Your skin itches badly and is red or swollen.  Your hearing changes.  You have trouble seeing.  You have  swelling or tingling in your mouth or throat.  You have trouble breathing.  You swallow any chlorhexidine. Summary  Chlorhexidine gluconate (CHG) is a germ-killing (antiseptic) solution that is used to clean the skin. Cleaning your skin with CHG may help to lower your risk for infection.  You may be given CHG to use for bathing. It may be in a bottle or in a prepackaged cloth to use on your skin. Carefully follow your health care provider's instructions and the instructions on the product label.  Do not use CHG if you have a chlorhexidine allergy.  Contact your health care provider if your skin gets irritated after scrubbing. This information is not intended to replace advice given to you by your health care provider. Make sure you discuss any questions you have with your health care provider. Document Revised: 10/21/2018 Document Reviewed: 07/02/2017 Elsevier Patient Education  [place] [address].  Toe Deformity Repair, Care After This sheet gives you information about how to care for yourself after your procedure. Your health care provider may also give you more specific instructions. If you have problems or questions, contact your health care provider. What can I expect after the procedure? After the procedure, it is common to have:  Pain in the affected area.  Discomfort with walking. Follow these instructions at home: If you have a post-operative shoe:   Wear the shoe as told by your health care provider. Remove it only as told by your health care provider.  Loosen the shoe if your toes tingle, become numb, or turn cold and blue.  Keep the shoe clean and dry. Bathing  Do not take baths, swim, or use a hot tub until your health care provider approves. Ask your health care provider if you can take showers. You may only be allowed to take sponge baths.  If your post-operative shoe is not waterproof, cover it with a watertight covering when you take a bath or a shower.  Keep  the  bandage (dressing) dry until your health care provider says it can be removed. Incision care   Follow instructions from your health care provider about how to take care of your incision. Make sure you: ? Wash your hands with soap and water before you change your dressing. If soap and water are not available, use hand sanitizer. ? Change your dressing as told by your health care provider. ? Leave stitches (sutures), skin glue, or adhesive strips in place. These skin closures may need to stay in place for 2 weeks or longer.  Check your incision area every day for signs of infection. Check for: ? Redness, swelling, or pain. ? Fluid or blood. ? Warmth. ? Pus or a bad smell. Managing pain, stiffness, and swelling   If directed, put ice on the affected area. ? Put ice in a plastic bag. ? Place a towel between your skin and the bag. ? Leave the ice on for 20 minutes, 2-3 times a day.  Raise (elevate) the affected foot above the level of your heart while you are sitting or lying down. Driving  Do not drive or use heavy machinery while taking prescription pain medicine.  Ask your health care provider when it is safe to drive if you have a post-operative shoe on your foot. Activity  Walk and return to your normal activities as told by your health care provider. Ask your health care provider what activities are safe for you.  Do not use your affected foot to support your body weight until your health care provider says that you can. Use crutches as directed by your health care provider.  Do exercises as told by your health care provider or physical therapist. General instructions  Take over-the-counter and prescription medicines only as told by your health care provider.  If you are taking prescription pain medicine, take actions to prevent or treat constipation. Your health care provider may recommend that you: ? Drink enough fluid to keep your urine pale yellow. ? Eat foods that  are high in fiber, such as fresh fruits and vegetables, whole grains, and beans. ? Limit foods that are high in fat and processed sugars, such as fried or sweet foods. ? Take an over-the-counter or prescription medicine for constipation.  Do not use any products that contain nicotine or tobacco, such as cigarettes and e-cigarettes. These can delay bone healing. If you need help quitting, ask your health care provider.  Keep all follow-up visits as told by your health care provider. This is important. Contact a health care provider if:  You have redness, swelling, or pain at your incision site.  You have fluid or blood coming from your incision.  Your incision feels warm to the touch.  You have pus or a bad smell coming from the incision area or the dressing.  You have a fever. Get help right away if:  You develop a rash.  You have difficulty breathing. Summary  After the procedure, it is common to have pain in the affected area and discomfort with walking.  Follow instructions from your health care provider about how to take care of your incision.  Walk and return to your normal activities as told by your health care provider. Ask your health care provider what activities are safe for you.  Keep all follow-up visits as told by your health care provider. This information is not intended to replace advice given to you by your health care provider. Make sure you discuss any questions you have  with your health care provider. Document Revised: 11/24/2018 Document Reviewed: 04/14/2017 Elsevier Patient Education  2020 [employer].

## 2020-05-14 ENCOUNTER — Other Ambulatory Visit (HOSPITAL_COMMUNITY)
Admission: RE | Admit: 2020-05-14 | Discharge: 2020-05-14 | Disposition: A | Discharge status: Home / Self Care | Payer: Self-pay | Source: Ambulatory Visit | Attending: Podiatry | Admitting: Podiatry

## 2020-05-14 ENCOUNTER — Encounter (HOSPITAL_COMMUNITY): Payer: Self-pay

## 2020-05-14 ENCOUNTER — Other Ambulatory Visit: Payer: Self-pay

## 2020-05-14 ENCOUNTER — Ambulatory Visit (HOSPITAL_COMMUNITY)
Admission: RE | Admit: 2020-05-14 | Discharge: 2020-05-14 | Disposition: A | Discharge status: Home / Self Care | Payer: Self-pay | Source: Ambulatory Visit | Attending: Podiatry | Admitting: Podiatry

## 2020-05-14 ENCOUNTER — Encounter (HOSPITAL_COMMUNITY)
Admission: RE | Admit: 2020-05-14 | Discharge: 2020-05-14 | Disposition: A | Discharge status: Home / Self Care | Payer: HRSA Program | Source: Ambulatory Visit | Attending: Podiatry | Admitting: Podiatry

## 2020-05-14 DIAGNOSIS — Z01818 Encounter for other preprocedural examination: Secondary | ICD-10-CM | POA: Diagnosis not present

## 2020-05-14 DIAGNOSIS — M2042 Other hammer toe(s) (acquired), left foot: Secondary | ICD-10-CM

## 2020-05-14 DIAGNOSIS — M2041 Other hammer toe(s) (acquired), right foot: Secondary | ICD-10-CM

## 2020-05-14 DIAGNOSIS — Z20822 Contact with and (suspected) exposure to covid-19: Secondary | ICD-10-CM | POA: Insufficient documentation

## 2020-05-15 LAB — SARS CORONAVIRUS 2 (TAT 6-24 HRS): SARS Coronavirus 2: NEGATIVE

## 2020-05-16 ENCOUNTER — Encounter (HOSPITAL_COMMUNITY): Payer: Self-pay | Admitting: Podiatry

## 2020-05-16 ENCOUNTER — Other Ambulatory Visit: Payer: Self-pay

## 2020-05-16 ENCOUNTER — Ambulatory Visit (HOSPITAL_COMMUNITY): Payer: Self-pay | Admitting: Anesthesiology

## 2020-05-16 ENCOUNTER — Ambulatory Visit (HOSPITAL_COMMUNITY)
Admission: RE | Admit: 2020-05-16 | Discharge: 2020-05-16 | Disposition: A | Discharge status: Home / Self Care | Payer: Self-pay | Attending: Podiatry | Admitting: Podiatry

## 2020-05-16 ENCOUNTER — Encounter (HOSPITAL_COMMUNITY)
Admission: RE | Disposition: A | Discharge status: Home / Self Care | Payer: Self-pay | Source: Home / Self Care | Attending: Podiatry

## 2020-05-16 ENCOUNTER — Ambulatory Visit (HOSPITAL_COMMUNITY): Payer: Self-pay

## 2020-05-16 DIAGNOSIS — Z9049 Acquired absence of other specified parts of digestive tract: Secondary | ICD-10-CM | POA: Insufficient documentation

## 2020-05-16 DIAGNOSIS — M2041 Other hammer toe(s) (acquired), right foot: Secondary | ICD-10-CM | POA: Insufficient documentation

## 2020-05-16 DIAGNOSIS — Z9889 Other specified postprocedural states: Secondary | ICD-10-CM

## 2020-05-16 DIAGNOSIS — Z9851 Tubal ligation status: Secondary | ICD-10-CM | POA: Insufficient documentation

## 2020-05-16 DIAGNOSIS — L851 Acquired keratosis [keratoderma] palmaris et plantaris: Secondary | ICD-10-CM | POA: Insufficient documentation

## 2020-05-16 DIAGNOSIS — D573 Sickle-cell trait: Secondary | ICD-10-CM | POA: Insufficient documentation

## 2020-05-16 DIAGNOSIS — M899 Disorder of bone, unspecified: Secondary | ICD-10-CM | POA: Insufficient documentation

## 2020-05-16 DIAGNOSIS — M2042 Other hammer toe(s) (acquired), left foot: Secondary | ICD-10-CM | POA: Insufficient documentation

## 2020-05-16 DIAGNOSIS — F17210 Nicotine dependence, cigarettes, uncomplicated: Secondary | ICD-10-CM | POA: Insufficient documentation

## 2020-05-16 HISTORY — PX: TOE ARTHROPLASTY: SHX6504

## 2020-05-16 SURGERY — ARTHROPLASTY, TOE
Anesthesia: General | Site: Toe | Laterality: Bilateral

## 2020-05-16 MED ORDER — BUPIVACAINE HCL (PF) 0.5 % IJ SOLN
INTRAMUSCULAR | Status: DC | PRN
  Administered 2020-05-16 (×2): 5 mL

## 2020-05-16 MED ORDER — SODIUM CHLORIDE 0.9 % IR SOLN
Status: DC | PRN
  Administered 2020-05-16: 1000 mL

## 2020-05-16 MED ORDER — FENTANYL CITRATE (PF) 100 MCG/2ML IJ SOLN
INTRAMUSCULAR | Status: DC | PRN
Start: 2020-05-16 — End: 2020-05-16
  Administered 2020-05-16: 25 ug via INTRAVENOUS

## 2020-05-16 MED ORDER — MIDAZOLAM HCL 2 MG/2ML IJ SOLN
INTRAMUSCULAR | Status: AC
  Filled 2020-05-16: qty 2

## 2020-05-16 MED ORDER — MIDAZOLAM HCL 5 MG/5ML IJ SOLN
INTRAMUSCULAR | Status: DC | PRN
  Administered 2020-05-16: 2 mg via INTRAVENOUS

## 2020-05-16 MED ORDER — LACTATED RINGERS IV SOLN: INTRAVENOUS | Status: DC | PRN

## 2020-05-16 MED ORDER — CHLORHEXIDINE GLUCONATE 0.12 % MT SOLN
15.0000 mL | Freq: Once | OROMUCOSAL | Status: AC
  Administered 2020-05-16: 15 mL via OROMUCOSAL

## 2020-05-16 MED ORDER — CHLORHEXIDINE GLUCONATE 0.12 % MT SOLN
OROMUCOSAL | Status: AC
  Filled 2020-05-16: qty 15

## 2020-05-16 MED ORDER — CHLORHEXIDINE GLUCONATE CLOTH 2 % EX PADS: 6.0000 | MEDICATED_PAD | Freq: Once | CUTANEOUS | Status: DC

## 2020-05-16 MED ORDER — ONDANSETRON HCL 4 MG/2ML IJ SOLN
INTRAMUSCULAR | Status: DC | PRN
  Administered 2020-05-16: 4 mg via INTRAVENOUS

## 2020-05-16 MED ORDER — LIDOCAINE HCL (PF) 1 % IJ SOLN
INTRAMUSCULAR | Status: AC
  Filled 2020-05-16: qty 30

## 2020-05-16 MED ORDER — ORAL CARE MOUTH RINSE: 15.0000 mL | Freq: Once | OROMUCOSAL | Status: AC

## 2020-05-16 MED ORDER — CEFAZOLIN SODIUM-DEXTROSE 2-4 GM/100ML-% IV SOLN
INTRAVENOUS | Status: AC
  Filled 2020-05-16: qty 100

## 2020-05-16 MED ORDER — PROPOFOL 10 MG/ML IV BOLUS
INTRAVENOUS | Status: DC | PRN
  Administered 2020-05-16: 20 mg via INTRAVENOUS
  Administered 2020-05-16: 100 mg via INTRAVENOUS
  Administered 2020-05-16: 20 mg via INTRAVENOUS

## 2020-05-16 MED ORDER — DEXAMETHASONE SODIUM PHOSPHATE 4 MG/ML IJ SOLN
INTRAMUSCULAR | Status: AC
  Filled 2020-05-16: qty 2

## 2020-05-16 MED ORDER — DEXMEDETOMIDINE HCL IN NACL 200 MCG/50ML IV SOLN
INTRAVENOUS | Status: AC
  Filled 2020-05-16: qty 50

## 2020-05-16 MED ORDER — PROMETHAZINE HCL 25 MG/ML IJ SOLN: 6.2500 mg | INTRAMUSCULAR | Status: DC | PRN

## 2020-05-16 MED ORDER — MEPERIDINE HCL 50 MG/ML IJ SOLN: 6.2500 mg | INTRAMUSCULAR | Status: DC | PRN

## 2020-05-16 MED ORDER — HYDROMORPHONE HCL 1 MG/ML IJ SOLN: 0.2500 mg | INTRAMUSCULAR | Status: DC | PRN

## 2020-05-16 MED ORDER — PROPOFOL 500 MG/50ML IV EMUL
INTRAVENOUS | Status: DC | PRN
  Administered 2020-05-16 (×2): 50 ug/kg/min via INTRAVENOUS

## 2020-05-16 MED ORDER — CEFAZOLIN SODIUM-DEXTROSE 2-4 GM/100ML-% IV SOLN
2.0000 g | INTRAVENOUS | Status: AC
  Administered 2020-05-16: 2 g via INTRAVENOUS

## 2020-05-16 MED ORDER — BUPIVACAINE HCL (PF) 0.5 % IJ SOLN
INTRAMUSCULAR | Status: AC
  Filled 2020-05-16: qty 30

## 2020-05-16 MED ORDER — FENTANYL CITRATE (PF) 100 MCG/2ML IJ SOLN
INTRAMUSCULAR | Status: AC
  Filled 2020-05-16: qty 2

## 2020-05-16 MED ORDER — LACTATED RINGERS IV SOLN: Freq: Once | INTRAVENOUS | Status: AC

## 2020-05-16 MED ORDER — DEXAMETHASONE SODIUM PHOSPHATE 4 MG/ML IJ SOLN
INTRAMUSCULAR | Status: DC | PRN
  Administered 2020-05-16 (×2): .5 mL

## 2020-05-16 SURGICAL SUPPLY — 67 items
APL SKNCLS STERI-STRIP NONHPOA (GAUZE/BANDAGES/DRESSINGS) ×1
BANDAGE ESMARK 4X12 BL STRL LF (DISPOSABLE) ×1 IMPLANT
BENZOIN TINCTURE PRP APPL 2/3 (GAUZE/BANDAGES/DRESSINGS) ×3 IMPLANT
BLADE AVERAGE 25MMX9MM (BLADE) ×1
BLADE AVERAGE 25X9 (BLADE) ×2 IMPLANT
BLADE OSC/SAG 11.5X5.5X.38 (BLADE) ×5 IMPLANT
BLADE SURG 15 STRL LF DISP TIS (BLADE) ×2 IMPLANT
BLADE SURG 15 STRL SS (BLADE) ×6
BNDG CMPR 12X4 ELC STRL LF (DISPOSABLE) ×1
BNDG CMPR STD VLCR NS LF 5.8X4 (GAUZE/BANDAGES/DRESSINGS) ×3
BNDG CONFORM 2 STRL LF (GAUZE/BANDAGES/DRESSINGS) ×7 IMPLANT
BNDG ELASTIC 4X5.8 VLCR NS LF (GAUZE/BANDAGES/DRESSINGS) ×7 IMPLANT
BNDG ESMARK 4X12 BLUE STRL LF (DISPOSABLE) ×3
BNDG GAUZE ELAST 4 BULKY (GAUZE/BANDAGES/DRESSINGS) ×5 IMPLANT
BNDG GAUZE ROLL STR 2.25X3YD (GAUZE/BANDAGES/DRESSINGS) ×4 IMPLANT
BNDG GZE SM 3X2.25 6 PLY (GAUZE/BANDAGES/DRESSINGS) ×2
BOOT STEPPER DURA LG (SOFTGOODS) IMPLANT
BOOT STEPPER DURA MED (SOFTGOODS) IMPLANT
BOOT STEPPER DURA SM (SOFTGOODS) IMPLANT
BOOT STEPPER DURA XLG (SOFTGOODS) IMPLANT
BUR FAST CUTTING (BURR) ×3
BUR SIDE CUT 44.8 STRL (BURR) IMPLANT
BUR SRGRND 54.5X2.4X8 (BURR) ×1 IMPLANT
BURR SIDE CUT 44.8 STRL (BURR) ×2
BURR SIDE CUT 44.8MML STRL (BURR) ×1
BURR SRGRND 54.5X2.4X8 (BURR) ×1
CLOSURE WOUND 1/2 X4 (GAUZE/BANDAGES/DRESSINGS) ×2
CLOTH BEACON ORANGE TIMEOUT ST (SAFETY) ×3 IMPLANT
COVER LIGHT HANDLE STERIS (MISCELLANEOUS) ×6 IMPLANT
COVER WAND RF STERILE (DRAPES) ×3 IMPLANT
CUFF TOURN SGL QUICK 18X4 (TOURNIQUET CUFF) IMPLANT
DECANTER SPIKE VIAL GLASS SM (MISCELLANEOUS) ×3 IMPLANT
DRAPE OEC MINIVIEW 54X84 (DRAPES) ×1 IMPLANT
DRSG ADAPTIC 3X8 NADH LF (GAUZE/BANDAGES/DRESSINGS) ×3 IMPLANT
DURAPREP 26ML APPLICATOR (WOUND CARE) ×4 IMPLANT
ELECT REM PT RETURN 9FT ADLT (ELECTROSURGICAL) ×3
ELECTRODE REM PT RTRN 9FT ADLT (ELECTROSURGICAL) ×1 IMPLANT
GAUZE SPONGE 4X4 12PLY STRL (GAUZE/BANDAGES/DRESSINGS) ×3 IMPLANT
GAUZE XEROFORM 5X9 LF (GAUZE/BANDAGES/DRESSINGS) ×2 IMPLANT
GLOVE BIO SURGEON STRL SZ7.5 (GLOVE) ×3 IMPLANT
GLOVE BIOGEL PI IND STRL 7.0 (GLOVE) ×2 IMPLANT
GLOVE BIOGEL PI IND STRL 7.5 (GLOVE) ×1 IMPLANT
GLOVE BIOGEL PI INDICATOR 7.0 (GLOVE) ×4
GLOVE BIOGEL PI INDICATOR 7.5 (GLOVE) ×4
GLOVE ECLIPSE 7.0 STRL STRAW (GLOVE) ×3 IMPLANT
GOWN STRL REUS W/ TWL LRG LVL3 (GOWN DISPOSABLE) ×1 IMPLANT
GOWN STRL REUS W/TWL LRG LVL3 (GOWN DISPOSABLE) ×9 IMPLANT
IV CATH PLACEMENT 20 GA (IV SOLUTION) ×2 IMPLANT
MANIFOLD NEPTUNE II (INSTRUMENTS) ×3 IMPLANT
NDL HYPO 27GX1-1/4 (NEEDLE) ×3 IMPLANT
NEEDLE HYPO 27GX1-1/4 (NEEDLE) ×9 IMPLANT
NS IRRIG 1000ML POUR BTL (IV SOLUTION) ×3 IMPLANT
PACK BASIC LIMB (CUSTOM PROCEDURE TRAY) ×3 IMPLANT
PAD ARMBOARD 7.5X6 YLW CONV (MISCELLANEOUS) ×3 IMPLANT
RASP SM TEAR CROSS CUT (RASP) IMPLANT
SET BASIN LINEN APH (SET/KITS/TRAYS/PACK) ×3 IMPLANT
SPONGE GAUZE 2X2 8PLY STER LF (GAUZE/BANDAGES/DRESSINGS) ×2
SPONGE GAUZE 2X2 8PLY STRL LF (GAUZE/BANDAGES/DRESSINGS) ×2 IMPLANT
SPONGE LAP 18X18 RF (DISPOSABLE) ×3 IMPLANT
STRIP CLOSURE SKIN 1/2X4 (GAUZE/BANDAGES/DRESSINGS) ×2 IMPLANT
SUT PROLENE 4 0 PS 2 18 (SUTURE) ×5 IMPLANT
SUT VIC AB 2-0 CT2 27 (SUTURE) ×3 IMPLANT
SUT VIC AB 4-0 PS2 27 (SUTURE) ×5 IMPLANT
SUT VICRYL AB 3-0 FS1 BRD 27IN (SUTURE) ×3 IMPLANT
SYR 20ML LL LF (SYRINGE) ×2 IMPLANT
SYR BULB IRRIG 60ML STRL (SYRINGE) ×2 IMPLANT
SYR CONTROL 10ML LL (SYRINGE) ×8 IMPLANT

## 2020-05-16 NOTE — Transfer of Care (Signed)
Immediate Anesthesia Transfer of Care Note  Patient: Carol [name]  Procedure(s) Performed: DEROTATIONAL ARTHROPLASTY OF FIFTH DIGIT RIGHT FOOT,DEROTATIONAL ARTHROPLASTY OF FIFTH DIGIT LEFT FOOT,EXOSTECTOMY OF FIFTH DIGIT RIGHT FOOT AND LEFT FOOT (Bilateral Toe)  Patient Location: PACU  Anesthesia Type:General  Level of Consciousness: awake and patient cooperative  Airway & Oxygen Therapy: Patient Spontanous Breathing  Post-op Assessment: Report given to RN and Post -op Vital signs reviewed and stable  Post vital signs: Reviewed and stable  Last Vitals:  Vitals Value Taken Time  BP 128/75 05/16/20 1135  Temp 97.4   Pulse 83 05/16/20 1136  Resp 15 05/16/20 1136  SpO2 96 % 05/16/20 1136  Vitals shown include unvalidated device data.  Last Pain:  Vitals:   05/16/20 0757  TempSrc: Oral  PainSc: 0-No pain         Complications: No complications documented.

## 2020-05-16 NOTE — H&P (Signed)
HISTORY AND PHYSICAL INTERVAL NOTE:  05/16/2020  9:56 AM  Carol [name]  has presented today for surgery, with the diagnosis of hammertoe of right 5th toe, hammertoe of left 5th toe, corn of both 5th toes, right 5th toe pain and left 5th toe pain.  The various methods of treatment have been discussed with the patient.  No guarantees were given.  After consideration of risks, benefits and other options for treatment, the patient has consented to surgery.  I have reviewed the patients' chart and labs.    Patient Vitals for the past 24 hrs:  BP Temp Temp src Pulse Resp SpO2 Height Weight  05/16/20 0757 137/70 98.1 F (36.7 C) Oral 64 16 97 % 5\' 7"  (1.702 m) 74.8 kg    A history and physical examination was performed in my office.  The patient was reexamined.  There have been no changes to this history and physical examination.  Marcheta Grammes, DPM

## 2020-05-16 NOTE — Anesthesia Preprocedure Evaluation (Signed)
Anesthesia Evaluation  Patient identified by MRN, date of birth, ID band Patient awake    Reviewed: Allergy & Precautions, NPO status , Patient's Chart, lab work & pertinent test results  History of Anesthesia Complications Negative for: history of anesthetic complications  Airway Mallampati: II  TM Distance: >3 FB Neck ROM: Full    Dental  (+) Dental Advisory Given, Missing   Pulmonary Current Smoker and Patient abstained from smoking.,  Bronchitis    Pulmonary exam normal breath sounds clear to auscultation       Cardiovascular Exercise Tolerance: Good Normal cardiovascular exam Rhythm:Regular Rate:Normal     Neuro/Psych  Headaches, negative psych ROS   GI/Hepatic negative GI ROS, (+)     substance abuse  alcohol use,   Endo/Other  negative endocrine ROS  Renal/GU negative Renal ROS  negative genitourinary   Musculoskeletal Back pain   Abdominal   Peds  Hematology negative hematology ROS (+)   Anesthesia Other Findings   Reproductive/Obstetrics negative OB ROS                            Anesthesia Physical Anesthesia Plan  ASA: II  Anesthesia Plan: General   Post-op Pain Management:    Induction: Intravenous  PONV Risk Score and Plan: TIVA  Airway Management Planned: Nasal Cannula and Natural Airway  Additional Equipment:   Intra-op Plan:   Post-operative Plan:   Informed Consent: I have reviewed the patients History and Physical, chart, labs and discussed the procedure including the risks, benefits and alternatives for the proposed anesthesia with the patient or authorized representative who has indicated his/her understanding and acceptance.     Dental advisory given  Plan Discussed with: CRNA and Surgeon  Anesthesia Plan Comments:         Anesthesia Quick Evaluation

## 2020-05-16 NOTE — Anesthesia Procedure Notes (Signed)
Date/Time: 05/16/2020 10:13 AM Performed by: Vista Deck, CRNA Pre-anesthesia Checklist: Patient identified, Emergency Drugs available, Suction available, Timeout performed and Patient being monitored Patient Re-evaluated:Patient Re-evaluated prior to induction Oxygen Delivery Method: Nasal Cannula

## 2020-05-16 NOTE — Op Note (Signed)
OPERATIVE NOTE  DATE OF PROCEDURE 05/16/2020  SURGEON Marcheta Grammes, DPM  ASSISTANT SURGEON None  OR STAFF Circulator: Connye Burkitt, RN; Cox, Gershon Mussel, RN Scrub Person: Karin Lieu, CST; Concha Pyo, Roxie R   PREOPERATIVE DIAGNOSIS 1.  Hammertoe deformity of the fifth digit, right foot 2.  Hammertoe deformity of the fifth digit, left foot 3.  Corn of the fifth digit, both feet 4.  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  Exostosis of the distal phalanx of the fifth digit, left foot 6.  Pain of the fifth digit, right foot 7.  Pain of the fifth digit, left foot  POSTOPERATIVE DIAGNOSIS Same  PROCEDURE 1.  Derotational arthroplasty of the fifth digit, right foot 2.  Exostectomy of the fifth digit, right foot 3.  Derotational arthroplasty of the fifth digit, left foot 4.  Exostectomy of the fifth digit, left foot  ANESTHESIA Monitor Anesthesia Care   HEMOSTASIS Pneumatic ankle tourniquet set at 250 mmHg  ESTIMATED BLOOD LOSS Minimal (<5 cc)  MATERIALS USED None  INJECTABLES 0.5% Marcaine plain Dexamethasone  PATHOLOGY None  COMPLICATIONS None  PROCEDURE IN DETAIL:  The patient was brought to the operating room and placed on the operative table in the supine position.  A pneumatic ankle tourniquet was placed about the patient's right ankle.  A pneumatic ankle tourniquet was placed about the patient's left ankle.  A timeout was performed.  The right foot was anesthetized with 0.5% Marcaine plain.  The left foot was anesthetized with 0.5% Marcaine plain.  Both feet were scrubbed, prepped and draped in usual sterile manner.  A second timeout was performed.  The right limb was then elevated, exsanguinated and the pneumatic ankle tourniquet was inflated to 250 mmHg.  Attention was directed to the right fifth toe.  A hyperkeratotic lesion was noted along the lateral nail groove.  The hyperkeratotic lesion was pared.  2 converging  semielliptical incisions were made parallel to the lateral border of the nail.  Dissection was continued deep down to the level of the distal phalanx.  The wedge of skin was removed and passed from the operative field.  The prominent lateral eminences of the distal phalanx and distal aspect of the middle phalanx were reduced using a sidecutting bur.  The surgical wound was irrigated with copious amounts of sterile irrigant.  The skin was reapproximated using 4-0 Prolene.  Attention was directed to the dorsal aspect of the right foot fifth toe where 2 converging semielliptical incisions were made from a proximal lateral to distal medial direction.  The wedge of skin was removed and passed from the operative field.  A transverse tenotomy and capsulotomy was performed.  The head of the proximal phalanx was resected using a power bone saw.  The head of the proximal phalanx was freed of all soft tissue attachments and passed from the operative field.  The toe was derotated and the extensor tendon reapproximated using 4-0 Vicryl.  The subcutaneous structures were reapproximated for Vicryl.  The skin was reapproximated using 4-0 Prolene.  0.5 cc of dexamethasone 4 mg/cc was injected into the fifth toe.  The wound closure was reinforced with Steri-Strips.  A sterile compressive dressing was applied to the right foot.  The pneumatic ankle tourniquet was deflated and a prompt hyperemic response was noted to all digits of the right foot.  The left limb was then elevated, exsanguinated and the pneumatic ankle tourniquet inflated was inflated to 250 mmHg.  The same 2 procedures were performed to the left fifth toe that were performed to the right fifth toe without admission or alteration.  A sterile compressive dressing was applied to the left foot.  The pneumatic ankle tourniquet was deflated and a prompt hyperemic response was noted to all digits of the left foot.  The patient tolerated the procedure and anesthesia well.   She was transferred from the operating room to the postanesthesia care unit with vital signs stable.

## 2020-05-16 NOTE — Discharge Instructions (Signed)
These instructions will give you an idea of what to expect after surgery and how to manage issues that may arise before your first post op office visit.  Pain Management Pain is best managed by "staying ahead" of it. If pain gets out of control, it is difficult to get it back under control. Local anesthesia that lasts 6-8 hours is used to numb the foot and decrease pain.  For the best pain control, take the pain medication every 4 hours for the first 2 days post op. On the third day pain medication can be taken as needed.   Post Op Nausea Nausea is common after surgery, so it is managed proactively.  If prescribed, use the prescribed nausea medication regularly for the first 2 days post op.  Bandages Do not worry if there is blood on the bandage. What looks like a lot of blood on the bandage is actually a small amount. Blood on the dressing spreads out as it is absorbed by the gauze, the same way a drop of water spreads out on a paper towel.  If the bandages feel wet or dry, stiff and uncomfortable, call the office during office hours and we will schedule a time for you to have the bandage changed.  Unless you are specifically told otherwise, we will do the first bandage change in the office.  Keep your bandage dry. If the bandage becomes wet or soiled, notify the office and we will schedule a time to change the bandage.  Activity It is best to spend most of the first 2 days after surgery lying down with your feet elevated above the level of your heart. You may put weight on your feet while wearing the surgical shoes.   You may only get up to go to the restroom.  Driving Do not drive until you are able to respond in an emergency (i.e. slam on the brakes). This usually occurs after the bone has healed - 6 to 8 weeks.  Call the Office If you have a fever over 101F.  If you have increasing pain after the initial post op pain has settled down.  If you have increasing redness, swelling, or  drainage.  If you have any questions or concerns.       General Anesthesia, Adult, Care After This sheet gives you information about how to care for yourself after your procedure. Your health care provider may also give you more specific instructions. If you have problems or questions, contact your health care provider. What can I expect after the procedure? After the procedure, the following side effects are common:  Pain or discomfort at the IV site.  Nausea.  Vomiting.  Sore throat.  Trouble concentrating.  Feeling cold or chills.  Weak or tired.  Sleepiness and fatigue.  Soreness and body aches. These side effects can affect parts of the body that were not involved in surgery. Follow these instructions at home:  For at least 24 hours after the procedure:  Have a responsible adult stay with you. It is important to have someone help care for you until you are awake and alert.  Rest as needed.  Do not: ? Participate in activities in which you could fall or become injured. ? Drive. ? Use heavy machinery. ? Drink alcohol. ? Take sleeping pills or medicines that cause drowsiness. ? Make important decisions or sign legal documents. ? Take care of children on your own. Eating and drinking  Follow any instructions from your health care  provider about eating or drinking restrictions.  When you feel hungry, start by eating small amounts of foods that are soft and easy to digest (bland), such as toast. Gradually return to your regular diet.  Drink enough fluid to keep your urine pale yellow.  If you vomit, rehydrate by drinking water, juice, or clear broth. General instructions  If you have sleep apnea, surgery and certain medicines can increase your risk for breathing problems. Follow instructions from your health care provider about wearing your sleep device: ? Anytime you are sleeping, including during daytime naps. ? While taking prescription pain medicines,  sleeping medicines, or medicines that make you drowsy.  Return to your normal activities as told by your health care provider. Ask your health care provider what activities are safe for you.  Take over-the-counter and prescription medicines only as told by your health care provider.  If you smoke, do not smoke without supervision.  Keep all follow-up visits as told by your health care provider. This is important. Contact a health care provider if:  You have nausea or vomiting that does not get better with medicine.  You cannot eat or drink without vomiting.  You have pain that does not get better with medicine.  You are unable to pass urine.  You develop a skin rash.  You have a fever.  You have redness around your IV site that gets worse. Get help right away if:  You have difficulty breathing.  You have chest pain.  You have blood in your urine or stool, or you vomit blood. Summary  After the procedure, it is common to have a sore throat or nausea. It is also common to feel tired.  Have a responsible adult stay with you for the first 24 hours after general anesthesia. It is important to have someone help care for you until you are awake and alert.  When you feel hungry, start by eating small amounts of foods that are soft and easy to digest (bland), such as toast. Gradually return to your regular diet.  Drink enough fluid to keep your urine pale yellow.  Return to your normal activities as told by your health care provider. Ask your health care provider what activities are safe for you. This information is not intended to replace advice given to you by your health care provider. Make sure you discuss any questions you have with your health care provider. Document Revised: 08/07/2017 Document Reviewed: 03/20/2017 Elsevier Patient Education  [place].

## 2020-05-16 NOTE — Brief Op Note (Signed)
BRIEF OPERATIVE NOTE  DATE OF PROCEDURE 05/16/2020  SURGEON Marcheta Grammes, DPM  ASSISTANT SURGEON None  OR STAFF Circulator: Connye Burkitt, RN; Cox, Gershon Mussel, RN Scrub Person: Karin Lieu, CST; Concha Pyo, Roxie R   PREOPERATIVE DIAGNOSIS 1.  Hammertoe deformity of the fifth digit, right foot 2.  Hammertoe deformity of the fifth digit, left foot 3.  Corn of the fifth digit, both feet 4.  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  Exostosis of the distal phalanx of the fifth digit, left foot 6.  Pain of the fifth digit, right foot 7.  Pain of the fifth digit, left foot  POSTOPERATIVE DIAGNOSIS Same  PROCEDURE 1.  Derotational arthroplasty of the fifth digit, right foot 2.  Exostectomy of the fifth digit, right foot 3.  Derotational arthroplasty of the fifth digit, left foot 4.  Exostectomy of the fifth digit, left foot  ANESTHESIA Monitor Anesthesia Care   HEMOSTASIS Pneumatic ankle tourniquet set at 250 mmHg  ESTIMATED BLOOD LOSS Minimal (<5 cc)  MATERIALS USED None  INJECTABLES 0.5% Marcaine plain Dexamethasone  PATHOLOGY None  COMPLICATIONS None

## 2020-05-16 NOTE — Anesthesia Postprocedure Evaluation (Signed)
Anesthesia Post Note  Patient: Carol [name]  Procedure(s) Performed: DEROTATIONAL ARTHROPLASTY OF FIFTH DIGIT RIGHT FOOT,DEROTATIONAL ARTHROPLASTY OF FIFTH DIGIT LEFT FOOT,EXOSTECTOMY OF FIFTH DIGIT RIGHT FOOT AND LEFT FOOT (Bilateral Toe)  Patient location during evaluation: PACU Anesthesia Type: General Level of consciousness: awake and alert and patient cooperative Pain management: satisfactory to patient Vital Signs Assessment: post-procedure vital signs reviewed and stable Respiratory status: spontaneous breathing Cardiovascular status: stable Postop Assessment: no apparent nausea or vomiting Anesthetic complications: no   No complications documented.   Last Vitals:  Vitals:   05/16/20 1145 05/16/20 1200  BP: 129/85 (!) 148/119  Pulse: 78 79  Resp: 14 19  Temp:    SpO2: 96% 99%    Last Pain:  Vitals:   05/16/20 1200  TempSrc:   PainSc: 0-No pain                 [name]

## 2020-05-17 ENCOUNTER — Encounter (HOSPITAL_COMMUNITY): Payer: Self-pay | Admitting: Podiatry

## 2020-07-05 ENCOUNTER — Other Ambulatory Visit: Payer: Self-pay

## 2020-07-05 ENCOUNTER — Emergency Department (HOSPITAL_COMMUNITY)
Admission: EM | Admit: 2020-07-05 | Discharge: 2020-07-06 | Disposition: A | Discharge status: Home / Self Care | Payer: Self-pay | Attending: Emergency Medicine | Admitting: Emergency Medicine

## 2020-07-05 DIAGNOSIS — F17210 Nicotine dependence, cigarettes, uncomplicated: Secondary | ICD-10-CM | POA: Insufficient documentation

## 2020-07-05 DIAGNOSIS — Z91040 Latex allergy status: Secondary | ICD-10-CM | POA: Insufficient documentation

## 2020-07-05 DIAGNOSIS — A64 Unspecified sexually transmitted disease: Secondary | ICD-10-CM | POA: Insufficient documentation

## 2020-07-05 DIAGNOSIS — A599 Trichomoniasis, unspecified: Secondary | ICD-10-CM | POA: Insufficient documentation

## 2020-07-06 ENCOUNTER — Encounter (HOSPITAL_COMMUNITY): Payer: Self-pay

## 2020-07-06 ENCOUNTER — Other Ambulatory Visit: Payer: Self-pay

## 2020-07-06 LAB — URINALYSIS, ROUTINE W REFLEX MICROSCOPIC
Bilirubin Urine: NEGATIVE
Glucose, UA: >=500 mg/dL — AB
Ketones, ur: NEGATIVE mg/dL
Nitrite: NEGATIVE
Protein, ur: NEGATIVE mg/dL
Specific Gravity, Urine: 1.017 (ref 1.005–1.030)
WBC, UA: >50 WBC/hpf — ABNORMAL HIGH (ref 0–5)
pH: 6 (ref 5.0–8.0)

## 2020-07-06 LAB — RPR: RPR Ser Ql: NONREACTIVE

## 2020-07-06 LAB — WET PREP, GENITAL
Clue Cells Wet Prep HPF POC: NONE SEEN
Sperm: NONE SEEN
Yeast Wet Prep HPF POC: NONE SEEN

## 2020-07-06 LAB — HIV ANTIBODY (ROUTINE TESTING W REFLEX): HIV Screen 4th Generation wRfx: NONREACTIVE

## 2020-07-06 MED ORDER — METRONIDAZOLE 500 MG PO TABS: 500.0000 mg | ORAL_TABLET | Freq: Two times a day (BID) | ORAL | 0 refills | Status: DC

## 2020-07-06 MED ORDER — DOXYCYCLINE HYCLATE 100 MG PO TABS
100.0000 mg | ORAL_TABLET | Freq: Once | ORAL | Status: AC
  Administered 2020-07-06: 100 mg via ORAL
  Filled 2020-07-06: qty 1

## 2020-07-06 MED ORDER — DOXYCYCLINE HYCLATE 100 MG PO CAPS: 100.0000 mg | ORAL_CAPSULE | Freq: Two times a day (BID) | ORAL | 0 refills | Status: DC

## 2020-07-06 MED ORDER — STERILE WATER FOR INJECTION IJ SOLN
INTRAMUSCULAR | Status: AC
  Administered 2020-07-06: 10 mL
  Filled 2020-07-06: qty 10

## 2020-07-06 MED ORDER — METRONIDAZOLE 500 MG PO TABS
500.0000 mg | ORAL_TABLET | Freq: Once | ORAL | Status: AC
  Administered 2020-07-06: 500 mg via ORAL
  Filled 2020-07-06: qty 1

## 2020-07-06 MED ORDER — CEFTRIAXONE SODIUM 500 MG IJ SOLR
500.0000 mg | Freq: Once | INTRAMUSCULAR | Status: AC
  Administered 2020-07-06: 500 mg via INTRAMUSCULAR
  Filled 2020-07-06: qty 500

## 2020-07-06 NOTE — ED Notes (Signed)
Assisted Dr. Dayna Barker with vaginal exam. Pt tolerated well and appeared to be in NAD. Specimens collected and sent to lab.

## 2020-07-06 NOTE — ED Triage Notes (Signed)
Pt reports vaginal itching and burning x 1 week. Pt reports discharge started about 3 days ago, sometimes its pink and sometimes it is light brown. Pt reports pain with wiping and some swelling to vaginal area.

## 2020-07-06 NOTE — ED Provider Notes (Signed)
[facility] EMERGENCY DEPARTMENT Provider Note   CSN: [id number] Arrival date & time: 07/05/20  2339     History Chief Complaint  Patient presents with  . Vaginal Itching    Carol [name] is a 54 y.o. female.  Unprotected intercourse about a week ago. No w/ vaginal itching and brownish yellowish discharge   Vaginal Itching This is a new problem. The current episode started more than 2 days ago. The problem occurs constantly. The problem has not changed since onset.Pertinent negatives include no chest pain, no abdominal pain, no headaches and no shortness of breath. Nothing aggravates the symptoms. Nothing relieves the symptoms. Treatments tried: monistat. The treatment provided no relief.    Past Medical History:  Diagnosis Date  . Back pain   . Bronchitis   . Cervicalgia   . History of blood in urine    microscopic  . History of migraine headaches   . Impaired fasting glucose     Patient Active Problem List   Diagnosis Date Noted  . 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 ABDOMINAL HYSTERECTOMY  2002  . CHOLECYSTECTOMY    . TOE ARTHROPLASTY Bilateral 05/16/2020   Procedure: DEROTATIONAL ARTHROPLASTY OF FIFTH DIGIT RIGHT FOOT,DEROTATIONAL ARTHROPLASTY OF FIFTH DIGIT LEFT FOOT,EXOSTECTOMY OF FIFTH DIGIT RIGHT FOOT AND LEFT FOOT;  Surgeon: Caprice Beaver, DPM;  Location: [facility];  Service: Podiatry;  Laterality: Bilateral;  . TUBAL LIGATION       OB History   No obstetric history on file.     Family History  Problem Relation Age of Onset  . Arthritis Paternal Grandmother   . Diabetes Paternal Grandmother   . Hypertension Paternal Grandmother   . Stroke Paternal Grandmother   . Diabetes Mother   . Colon cancer Neg Hx     Social History   Tobacco Use  . Smoking status: Current Every Day Smoker    Packs/day: 0.50    Years: 20.00    Pack years: 10.00    Types: Cigarettes, Cigars  . Smokeless tobacco:  Never Used  . Tobacco comment: Tobacco info given  Vaping Use  . Vaping Use: Never used  Substance Use Topics  . Alcohol use: Yes    Comment: occ.  . Drug use: No    Home Medications Prior to Admission medications   Medication Sig Start Date End Date Taking? Authorizing Provider  doxycycline (VIBRAMYCIN) 100 MG capsule Take 1 capsule (100 mg total) by mouth 2 (two) times daily. One po bid x 7 days 07/06/20   Shantinique Picazo, Corene Cornea, MD  metroNIDAZOLE (FLAGYL) 500 MG tablet Take 1 tablet (500 mg total) by mouth 2 (two) times daily. One po bid x 7 days 07/06/20   Khoen Genet, Corene Cornea, MD    Allergies    Naftin [naftifine hcl] and Latex  Review of Systems   Review of Systems  Respiratory: Negative for shortness of breath.   Cardiovascular: Negative for chest pain.  Gastrointestinal: Negative for abdominal pain.  Genitourinary: Positive for dysuria, vaginal discharge and vaginal pain.  Neurological: Negative for headaches.  All other systems reviewed and are negative.   Physical Exam Updated Vital Signs BP 137/80 (BP Location: Left Arm)   Pulse 81   Resp 16   Ht 5\' 7"  (1.702 m)   Wt 77.1 kg   SpO2 98%   BMI 26.63 kg/m   Physical Exam Vitals and nursing note reviewed. Exam conducted with a chaperone present.  Constitutional:  Appearance: She is well-developed.  HENT:     Head: Normocephalic and atraumatic.     Mouth/Throat:     Mouth: Mucous membranes are moist.     Pharynx: Oropharynx is clear.  Eyes:     Pupils: Pupils are equal, round, and reactive to light.  Cardiovascular:     Rate and Rhythm: Normal rate and regular rhythm.  Pulmonary:     Effort: No respiratory distress.     Breath sounds: No stridor.  Abdominal:     General: There is no distension.  Genitourinary:    Labia:        Right: Tenderness present. No rash.        Left: Tenderness present. No rash.      Vagina: Vaginal discharge present. No erythema, tenderness or bleeding.     Cervix: Discharge,  friability and erythema present. No cervical motion tenderness.     Comments: Chaperoned by nurse Colletta Maryland Musculoskeletal:        General: No swelling or tenderness. Normal range of motion.     Cervical back: Normal range of motion.  Skin:    General: Skin is warm and dry.  Neurological:     General: No focal deficit present.     Mental Status: She is alert.     ED Results / Procedures / Treatments   Labs (all labs ordered are listed, but only abnormal results are displayed) Labs Reviewed  WET PREP, GENITAL - Abnormal; Notable for the following components:      Result Value   Trich, Wet Prep PRESENT (*)    WBC, Wet Prep HPF POC MANY (*)    All other components within normal limits  URINALYSIS, ROUTINE W REFLEX MICROSCOPIC - Abnormal; Notable for the following components:   APPearance HAZY (*)    Glucose, UA >=500 (*)    Hgb urine dipstick MODERATE (*)    Leukocytes,Ua LARGE (*)    WBC, UA >50 (*)    Bacteria, UA RARE (*)    All other components within normal limits  URINE CULTURE  RPR  HIV ANTIBODY (ROUTINE TESTING W REFLEX)  GC/CHLAMYDIA PROBE AMP ([place]) NOT AT [facility]  WET PREP  (BD AFFIRM) ([place])    EKG None  Radiology No results found.  Procedures Procedures (including critical care time)  Medications Ordered in ED Medications  cefTRIAXone (ROCEPHIN) injection 500 mg (500 mg Intramuscular Given 07/06/20 0219)  doxycycline (VIBRA-TABS) tablet 100 mg (100 mg Oral Given 07/06/20 0218)  metroNIDAZOLE (FLAGYL) tablet 500 mg (500 mg Oral Given 07/06/20 0218)  sterile water (preservative free) injection (10 mLs  Given 07/06/20 0219)    ED Course  I have reviewed the triage vital signs and the nursing notes.  Pertinent labs & imaging results that were available during my care of the patient were reviewed by me and considered in my medical decision making (see chart for details).    MDM Rules/Calculators/A&P                         Likely STD.    Trichomonas positive. Will tx for gc/chlam prophylactically. pcp follow up for recheck. Urine likely from the cervicitis.  Final Clinical Impression(s) / ED Diagnoses Final diagnoses:  Trichomonas infection  STD (female)    Rx / DC Orders ED Discharge Orders         Ordered    doxycycline (VIBRAMYCIN) 100 MG capsule  2 times daily  07/06/20 0207    metroNIDAZOLE (FLAGYL) 500 MG tablet  2 times daily        07/06/20 0207           Gared Gillie, Corene Cornea, MD 07/06/20 [phone]

## 2020-07-07 LAB — URINE CULTURE: Culture: <10000 — AB

## 2020-07-09 LAB — GC/CHLAMYDIA PROBE AMP (~~LOC~~) NOT AT ARMC
Chlamydia: NEGATIVE
Comment: NEGATIVE
Comment: NORMAL
Neisseria Gonorrhea: NEGATIVE

## 2020-09-23 ENCOUNTER — Emergency Department (HOSPITAL_COMMUNITY)
Admission: EM | Admit: 2020-09-23 | Discharge: 2020-09-23 | Disposition: A | Discharge status: Home / Self Care | Payer: Self-pay | Attending: Emergency Medicine | Admitting: Emergency Medicine

## 2020-09-23 ENCOUNTER — Other Ambulatory Visit: Payer: Self-pay

## 2020-09-23 ENCOUNTER — Emergency Department (HOSPITAL_COMMUNITY): Payer: Self-pay

## 2020-09-23 DIAGNOSIS — Z96693 Finger-joint replacement, bilateral: Secondary | ICD-10-CM | POA: Insufficient documentation

## 2020-09-23 DIAGNOSIS — Z91040 Latex allergy status: Secondary | ICD-10-CM | POA: Insufficient documentation

## 2020-09-23 DIAGNOSIS — F17210 Nicotine dependence, cigarettes, uncomplicated: Secondary | ICD-10-CM | POA: Insufficient documentation

## 2020-09-23 DIAGNOSIS — E876 Hypokalemia: Secondary | ICD-10-CM | POA: Insufficient documentation

## 2020-09-23 DIAGNOSIS — R002 Palpitations: Secondary | ICD-10-CM | POA: Insufficient documentation

## 2020-09-23 LAB — CBC
HCT: 37.9 % (ref 36.0–46.0)
Hemoglobin: 13 g/dL (ref 12.0–15.0)
MCH: 29.9 pg (ref 26.0–34.0)
MCHC: 34.3 g/dL (ref 30.0–36.0)
MCV: 87.1 fL (ref 80.0–100.0)
Platelets: 338 10*3/uL (ref 150–400)
RBC: 4.35 MIL/uL (ref 3.87–5.11)
RDW: 12.9 % (ref 11.5–15.5)
WBC: 7.8 10*3/uL (ref 4.0–10.5)
nRBC: 0 % (ref 0.0–0.2)

## 2020-09-23 LAB — BASIC METABOLIC PANEL
Anion gap: 10 (ref 5–15)
BUN: 7 mg/dL (ref 6–20)
CO2: 28 mmol/L (ref 22–32)
Calcium: 9.2 mg/dL (ref 8.9–10.3)
Chloride: 101 mmol/L (ref 98–111)
Creatinine, Ser: 0.69 mg/dL (ref 0.44–1.00)
GFR, Estimated: >60 mL/min (ref >60)
Glucose, Bld: 206 mg/dL — ABNORMAL HIGH (ref 70–99)
Potassium: 3.2 mmol/L — ABNORMAL LOW (ref 3.5–5.1)
Sodium: 139 mmol/L (ref 135–145)

## 2020-09-23 LAB — TROPONIN I (HIGH SENSITIVITY)
Troponin I (High Sensitivity): 3 ng/L (ref <18)
Troponin I (High Sensitivity): 4 ng/L (ref <18)

## 2020-09-23 MED ORDER — POTASSIUM CHLORIDE CRYS ER 20 MEQ PO TBCR
40.0000 meq | EXTENDED_RELEASE_TABLET | Freq: Once | ORAL | Status: AC
  Administered 2020-09-23: 40 meq via ORAL
  Filled 2020-09-23: qty 2

## 2020-09-23 NOTE — ED Provider Notes (Signed)
[facility] DEPT Provider Note   CSN: [id number] Arrival date & time: 09/23/20  1528     History Chief Complaint  Patient presents with  . Tachycardia    Carol [name] is a 55 y.o. female.  Patient presents the hospital today for evaluation of palpitations and lightheaded spells.  Patient states that she was at work, at [employer], taking inventory and helping customers when she had an episode of heart fluttering.  She felt lightheaded with this episode but did not pass out.  This occurred around 130.  Less than an hour later she had another episode which felt the same except she had near syncope.  She states that she had to hold onto the counter to avoid from falling over.  She had chest pain with a spell that then resolved.  She is now asymptomatic.  The episode scared her so she asked the family member to bring her to the hospital.  She denies history of hypertension, high cholesterol, diabetes.  She does smoke.  She denies family history of arrhythmia or heart disease.  She denies any recent nausea, vomiting, or diarrhea or other reasons that she would be dehydrated.  She states she drinks a lot of fluid at work.  She states that she had 2 episodes after arrival to the emergency department, one episode after returning to exam room from chest x-ray.  She denies use of caffeine.  She denies alcohol use.        Past Medical History:  Diagnosis Date  . Back pain   . Bronchitis   . Cervicalgia   . History of blood in urine    microscopic  . History of migraine headaches   . Impaired fasting glucose     Patient Active Problem List   Diagnosis Date Noted  . 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 ABDOMINAL HYSTERECTOMY  2002  . CHOLECYSTECTOMY    . TOE ARTHROPLASTY Bilateral 05/16/2020   Procedure: DEROTATIONAL ARTHROPLASTY OF FIFTH DIGIT RIGHT FOOT,DEROTATIONAL ARTHROPLASTY OF FIFTH DIGIT LEFT  FOOT,EXOSTECTOMY OF FIFTH DIGIT RIGHT FOOT AND LEFT FOOT;  Surgeon: Caprice Beaver, DPM;  Location: [facility];  Service: Podiatry;  Laterality: Bilateral;  . TUBAL LIGATION       OB History   No obstetric history on file.     Family History  Problem Relation Age of Onset  . Arthritis Paternal Grandmother   . Diabetes Paternal Grandmother   . Hypertension Paternal Grandmother   . Stroke Paternal Grandmother   . Diabetes Mother   . Colon cancer Neg Hx     Social History   Tobacco Use  . Smoking status: Current Every Day Smoker    Packs/day: 0.50    Years: 20.00    Pack years: 10.00    Types: Cigarettes, Cigars  . Smokeless tobacco: Never Used  . Tobacco comment: Tobacco info given  Vaping Use  . Vaping Use: Never used  Substance Use Topics  . Alcohol use: Yes    Comment: occ.  . Drug use: No    Home Medications Prior to Admission medications   Medication Sig Start Date End Date Taking? Authorizing Provider  doxycycline (VIBRAMYCIN) 100 MG capsule Take 1 capsule (100 mg total) by mouth 2 (two) times daily. One po bid x 7 days 07/06/20   Mesner, Corene Cornea, MD  metroNIDAZOLE (FLAGYL) 500 MG tablet Take 1 tablet (500 mg total) by mouth 2 (two) times  daily. One po bid x 7 days 07/06/20   Mesner, Corene Cornea, MD    Allergies    Naftin [naftifine hcl] and Latex  Review of Systems   Review of Systems  Constitutional: Negative for diaphoresis and fever.  Eyes: Negative for redness.  Respiratory: Negative for cough and shortness of breath.   Cardiovascular: Positive for chest pain. Negative for palpitations and leg swelling.  Gastrointestinal: Negative for abdominal pain, nausea and vomiting.  Genitourinary: Negative for dysuria.  Musculoskeletal: Negative for back pain and neck pain.  Skin: Negative for rash.  Neurological: Positive for light-headedness. Negative for syncope.  Psychiatric/Behavioral: The patient is not nervous/anxious.     Physical Exam Updated Vital  Signs BP (!) 157/75 (BP Location: Right Arm)   Pulse (!) 101   Temp 98.1 F (36.7 C) (Oral)   Resp 17   SpO2 100%   Physical Exam Vitals and nursing note reviewed.  Constitutional:      Appearance: She is well-developed and well-nourished. She is not diaphoretic.  HENT:     Head: Normocephalic and atraumatic.     Mouth/Throat:     Mouth: Mucous membranes are normal. Mucous membranes are not dry.  Eyes:     Conjunctiva/sclera: Conjunctivae normal.  Neck:     Vascular: Normal carotid pulses. No carotid bruit or JVD.     Trachea: Trachea normal. No tracheal deviation.  Cardiovascular:     Rate and Rhythm: Normal rate and regular rhythm.     Pulses: Intact distal pulses. No decreased pulses.     Heart sounds: Normal heart sounds, S1 normal and S2 normal. No murmur heard.   Pulmonary:     Effort: Pulmonary effort is normal. No respiratory distress.     Breath sounds: No wheezing.  Chest:     Chest wall: No tenderness.  Abdominal:     General: Bowel sounds are normal. Aorta is normal.     Palpations: Abdomen is soft.     Tenderness: There is no abdominal tenderness. There is no guarding or rebound.  Musculoskeletal:        General: Normal range of motion.     Cervical back: Normal range of motion and neck supple. No muscular tenderness.  Skin:    General: Skin is warm and dry.     Coloration: Skin is not pale.     Nails: There is no cyanosis.  Neurological:     Mental Status: She is alert.  Psychiatric:        Mood and Affect: Mood and affect normal.     ED Results / Procedures / Treatments   Labs (all labs ordered are listed, but only abnormal results are displayed) Labs Reviewed  BASIC METABOLIC PANEL - Abnormal; Notable for the following components:      Result Value   Potassium 3.2 (*)    Glucose, Bld 206 (*)    All other components within normal limits  CBC  TROPONIN I (HIGH SENSITIVITY)  TROPONIN I (HIGH SENSITIVITY)    EKG EKG  Interpretation  Date/Time:  Sunday September 23 2020 15:51:01 EST Ventricular Rate:  90 PR Interval:    QRS Duration: 78 QT Interval:  388 QTC Calculation: 475 R Axis:   35 Text Interpretation: Sinus rhythm Probable left atrial enlargement Borderline T wave abnormalities 12 Lead; Mason-Likar Confirmed by Quintella Reichert [phone]) on 09/23/2020 3:59:43 PM   Radiology DG Chest 2 View  Result Date: 09/23/2020 CLINICAL DATA:  Elevated heart rate and chest pain. EXAM: CHEST -  2 VIEW COMPARISON:  Chest radiograph May 22, 2018 FINDINGS: The heart size and mediastinal contours are within normal limits. Both lungs are clear. The visualized skeletal structures are unremarkable. Right upper quadrant surgical clips. IMPRESSION: No active cardiopulmonary disease. Electronically Signed   By: Dahlia Bailiff MD   On: 09/23/2020 16:22    Procedures Procedures   Medications Ordered in ED Medications  potassium chloride SA (KLOR-CON) CR tablet 40 mEq (40 mEq Oral Given 09/23/20 2056)    ED Course  I have reviewed the triage vital signs and the nursing notes.  Pertinent labs & imaging results that were available during my care of the patient were reviewed by me and considered in my medical decision making (see chart for details).  Patient seen and examined.  Reviewed labs, EKG, chest x-ray.  Work-up is reassuring.  I will await second troponin.  I reviewed patient's telemetry and she has not had any episodes of arrhythmia while being on telemetry.  Will recheck.  Elevated blood sugar noted.  Vital signs reviewed and are as follows: BP (!) 157/75 (BP Location: Right Arm)   Pulse (!) 101   Temp 98.1 F (36.7 C) (Oral)   Resp 17   SpO2 100%    9:01 PM rechecked telemetry.  She was moved to another exam area and she has had no events.  Potassium 3.2, repletion ordered.  Otherwise, plan for discharge to home.  We discussed that sometimes irregular heart rhythms can occur and we may not pick up on these  while in the emergency department.  For this reason she is instructed to return if she develops chest pain, shortness of breath, episodes where she passes out or has any other concerns.  She should follow-up with primary care doctor and possibly cardiology for consideration of a Holter monitor if symptoms persist.  She is given referral for these.  Otherwise plan for discharged home.      MDM Rules/Calculators/A&P                          Patient presents to the emergency department for palpitations today.  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  Fortunately cardiac work-up here has been unremarkable.  She has been monitored on telemetry without any episodes of irregular heartbeats.  Indication for admission at this time.  She looks well.  Slightly low potassium and she was given oral supplementation.  Referrals as above.  She seems reliable to return with worsening.   Final Clinical Impression(s) / ED Diagnoses Final diagnoses:  Palpitations  Hypokalemia    Rx / DC Orders ED Discharge Orders    None       Suann Larry 09/23/20 2103    Quintella Reichert, MD 09/24/20 [phone]

## 2020-09-23 NOTE — Discharge Instructions (Signed)
Please read and follow all provided instructions.  Your diagnoses today include:  1. Palpitations   2. Hypokalemia     Tests performed today include:  An EKG of your heart and monitoring that did not show a problem  A chest x-ray  Cardiac enzymes - a blood test for heart muscle damage  Blood counts and electrolytes - slightly low potassium and slightly high blood sugar  Vital signs. See below for your results today.   Medications prescribed:   None  Take any prescribed medications only as directed.  Follow-up instructions: Please follow-up with your primary care provider as soon as you can for further evaluation of your symptoms.   Return instructions:  SEEK IMMEDIATE MEDICAL ATTENTION IF:  You have severe chest pain, especially if the pain is crushing or pressure-like and spreads to the arms, back, neck, or jaw, or if you have sweating, nausea (feeling sick to your stomach), or shortness of breath. THIS IS AN EMERGENCY. Don't wait to see if the pain will go away. Get medical help at once. Call 911 or [phone] (operator). DO NOT drive yourself to the hospital.   Your chest pain gets worse and does not go away with rest.   You have an attack of chest pain lasting longer than usual, despite rest and treatment with the medications your caregiver has prescribed.   You wake from sleep with chest pain or shortness of breath.  You feel dizzy or faint.  You have chest pain not typical of your usual pain for which you originally saw your caregiver.   You have any other emergent concerns regarding your health.  Your vital signs today were: BP 131/79    Pulse 77    Temp 98.1 F (36.7 C) (Oral)    Resp 16    SpO2 100%  If your blood pressure (BP) was elevated above 135/85 this visit, please have this repeated by your doctor within one month. --------------

## 2020-09-23 NOTE — ED Triage Notes (Signed)
Patient reports to the ER for elevated heart rate. Patient states she had an episode of gray BM yesterday. Patient reports she felt her heart rate flutter x3 today and felt close to passing out. Patient reports a sharp change in her central chest during one of the episodes. Patient endorses smoking cigarettes.

## 2021-06-02 ENCOUNTER — Emergency Department (HOSPITAL_COMMUNITY)
Admission: EM | Admit: 2021-06-02 | Discharge: 2021-06-03 | Disposition: A | Discharge status: Home / Self Care | Payer: Self-pay | Attending: Emergency Medicine | Admitting: Emergency Medicine

## 2021-06-02 ENCOUNTER — Other Ambulatory Visit: Payer: Self-pay

## 2021-06-02 ENCOUNTER — Encounter (HOSPITAL_COMMUNITY): Payer: Self-pay

## 2021-06-02 DIAGNOSIS — S40261A Insect bite (nonvenomous) of right shoulder, initial encounter: Secondary | ICD-10-CM | POA: Insufficient documentation

## 2021-06-02 DIAGNOSIS — Z5321 Procedure and treatment not carried out due to patient leaving prior to being seen by health care provider: Secondary | ICD-10-CM | POA: Insufficient documentation

## 2021-06-02 DIAGNOSIS — W57XXXA Bitten or stung by nonvenomous insect and other nonvenomous arthropods, initial encounter: Secondary | ICD-10-CM | POA: Insufficient documentation

## 2021-06-02 NOTE — ED Triage Notes (Signed)
On Wednesday, patient got bit by a spider, it crawled up in her clothes. She does not know what kind. Appears red, she said her arm gets stiff. The bite is on her right shoulder blade.

## 2021-06-11 ENCOUNTER — Emergency Department (HOSPITAL_COMMUNITY): Payer: Self-pay

## 2021-06-11 ENCOUNTER — Other Ambulatory Visit: Payer: Self-pay

## 2021-06-11 ENCOUNTER — Emergency Department (HOSPITAL_COMMUNITY)
Admission: EM | Admit: 2021-06-11 | Discharge: 2021-06-11 | Disposition: A | Discharge status: Home / Self Care | Payer: Self-pay | Attending: Emergency Medicine | Admitting: Emergency Medicine

## 2021-06-11 ENCOUNTER — Encounter (HOSPITAL_COMMUNITY): Payer: Self-pay

## 2021-06-11 DIAGNOSIS — M5412 Radiculopathy, cervical region: Secondary | ICD-10-CM | POA: Insufficient documentation

## 2021-06-11 DIAGNOSIS — R739 Hyperglycemia, unspecified: Secondary | ICD-10-CM | POA: Insufficient documentation

## 2021-06-11 DIAGNOSIS — Z91040 Latex allergy status: Secondary | ICD-10-CM | POA: Insufficient documentation

## 2021-06-11 DIAGNOSIS — I10 Essential (primary) hypertension: Secondary | ICD-10-CM | POA: Insufficient documentation

## 2021-06-11 DIAGNOSIS — M25512 Pain in left shoulder: Secondary | ICD-10-CM | POA: Insufficient documentation

## 2021-06-11 DIAGNOSIS — Z96698 Presence of other orthopedic joint implants: Secondary | ICD-10-CM | POA: Insufficient documentation

## 2021-06-11 DIAGNOSIS — F17210 Nicotine dependence, cigarettes, uncomplicated: Secondary | ICD-10-CM | POA: Insufficient documentation

## 2021-06-11 DIAGNOSIS — R200 Anesthesia of skin: Secondary | ICD-10-CM | POA: Insufficient documentation

## 2021-06-11 LAB — CBC
HCT: 39.9 % (ref 36.0–46.0)
Hemoglobin: 13.7 g/dL (ref 12.0–15.0)
MCH: 29.1 pg (ref 26.0–34.0)
MCHC: 34.3 g/dL (ref 30.0–36.0)
MCV: 84.9 fL (ref 80.0–100.0)
Platelets: 376 10*3/uL (ref 150–400)
RBC: 4.7 MIL/uL (ref 3.87–5.11)
RDW: 12.2 % (ref 11.5–15.5)
WBC: 9.4 10*3/uL (ref 4.0–10.5)
nRBC: 0 % (ref 0.0–0.2)

## 2021-06-11 LAB — BASIC METABOLIC PANEL
Anion gap: 10 (ref 5–15)
BUN: 6 mg/dL (ref 6–20)
CO2: 28 mmol/L (ref 22–32)
Calcium: 9.6 mg/dL (ref 8.9–10.3)
Chloride: 100 mmol/L (ref 98–111)
Creatinine, Ser: 0.76 mg/dL (ref 0.44–1.00)
GFR, Estimated: >60 mL/min (ref >60)
Glucose, Bld: 270 mg/dL — ABNORMAL HIGH (ref 70–99)
Potassium: 3.5 mmol/L (ref 3.5–5.1)
Sodium: 138 mmol/L (ref 135–145)

## 2021-06-11 LAB — TROPONIN I (HIGH SENSITIVITY)
Troponin I (High Sensitivity): 6 ng/L (ref <18)
Troponin I (High Sensitivity): 5 ng/L (ref <18)

## 2021-06-11 LAB — I-STAT BETA HCG BLOOD, ED (MC, WL, AP ONLY): I-stat hCG, quantitative: <5 m[IU]/mL (ref <5)

## 2021-06-11 MED ORDER — ACETAMINOPHEN 325 MG PO TABS: 650.0000 mg | ORAL_TABLET | Freq: Four times a day (QID) | ORAL | 0 refills | Status: DC | PRN

## 2021-06-11 MED ORDER — GABAPENTIN 100 MG PO CAPS: 100.0000 mg | ORAL_CAPSULE | Freq: Three times a day (TID) | ORAL | 0 refills | Status: DC

## 2021-06-11 MED ORDER — METHYLPREDNISOLONE 4 MG PO TBPK: ORAL_TABLET | ORAL | 0 refills | Status: DC

## 2021-06-11 MED ORDER — IBUPROFEN 600 MG PO TABS: 600.0000 mg | ORAL_TABLET | Freq: Four times a day (QID) | ORAL | 0 refills | Status: DC | PRN

## 2021-06-11 NOTE — ED Triage Notes (Addendum)
Patient states that she was bitten by spider 2 weeks ago and having left arm numbness today. Patient states the bite is posterior right shoulder and no redness nor wound noted. Reports pins and needles to left shoulder. No CP no SOB Patient does a lot of lifting at work and also having neck pain

## 2021-06-11 NOTE — Discharge Instructions (Addendum)
Your work-up in the ER overall was reassuring today.  I suspect that your left shoulder pain and arm pain is likely related to a pinched nerve issue in your neck or in the nerves that come out from your neck.  I do not see signs of stroke, heart attack, collapsed lung, or other life-threatening emergencies on your work-up today.  We will start you on some steroids (prednisone) and NSAIDS (ibuprofen) which can help with the inflammation and swelling of the nerves.  I will also start on gabapentin which is a medication take as needed for nerve pain.  You can continue taking Tylenol on top of these medications for pain.  You can also use over-the-counter muscle rubs on your back, or heating packs, which can help relax the muscles around your neck.  Please note that your blood test showed that your blood sugars were somewhat high today.  This is not a specific finding, but may be a sign of diabetes or pre-diabetes.  There is important you follow-up with your primary care doctor for this issue, as well as for your blood pressure, which was also high today.

## 2021-06-11 NOTE — ED Provider Notes (Signed)
[facility]PART[facility] Provider Note   CSN: [id number] Arrival date & time: 06/11/21  1253     History No chief complaint on file.   Carol [name] is a 55 y.o. female presenting from work with complaint of left arm numbness and heaviness.  The patient reports that she works at [employer], unloads trucks, [employer], is very physical work.  She was doing this this morning.  She noted that after a lot of lifting and unloading, she began having left-sided neck pain, left shoulder pain, and also pain and numbness and heaviness in her left arm, mostly from the elbow to the hand.  It involves the entire arm, not just the ulnar aspect.  She denies any significant headache at that time.  She is says she has not had the symptoms before.  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  Since arriving in the ED she reports her symptoms have gradually improved.  She still has a little heaviness in her left arm but this sensation has largely returned.  She reports soreness mostly in her left shoulder, worse with overhead arm raise.  She reports a history of hypertension, smoking.  Denies history of high cholesterol or diabetes.  She denies any history of stroke or TIA.  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     Family History  Problem Relation Age of Onset   Arthritis Paternal Grandmother    Diabetes Paternal Grandmother    Hypertension Paternal Grandmother    Stroke Paternal Grandmother    Diabetes Mother    Colon cancer Neg Hx     Social History   Tobacco Use   Smoking status: Every Day    Packs/day: 1.00    Years: 20.00    Pack years: 20.00    Types: Cigarettes, Cigars   Smokeless tobacco: Never   Tobacco comments:    Tobacco info given  Vaping Use   Vaping Use: Never used  Substance Use Topics   Alcohol use: Yes    Comment: occ.   Drug use: No    Home Medications Prior to Admission medications   Medication Sig Start Date End Date Taking? Authorizing Provider  acetaminophen (TYLENOL) 325 MG tablet Take 2 tablets (650 mg total) by mouth every 6 (six) hours as needed for up to 30 doses for moderate pain or mild pain. 06/11/21  Yes Wyvonnia Dusky, MD  gabapentin (NEURONTIN) 100 MG capsule Take 1 capsule (100 mg total) by mouth 3 (three) times daily. 06/11/21 07/11/21 Yes Abdulla Pooley, Carola Rhine, MD  ibuprofen (ADVIL) 600 MG tablet Take 1 tablet (600 mg total) by mouth every 6 (six) hours as needed for up to 30 doses for mild pain or moderate pain. 06/11/21  Yes Wyvonnia Dusky, MD  methylPREDNISolone (MEDROL DOSEPAK) 4 MG TBPK tablet Take as directed on package 06/12/21  Yes Emlyn Maves, Carola Rhine, MD  doxycycline (VIBRAMYCIN) 100 MG capsule Take 1 capsule (100 mg total) by mouth 2 (two) times daily. One po bid x 7 days 07/06/20   Mesner, Corene Cornea, MD  metroNIDAZOLE (FLAGYL) 500 MG tablet Take 1 tablet (500 mg total) by mouth 2 (two) times daily. One po bid x 7 days 07/06/20   Mesner, Corene Cornea, MD    Allergies    Naftin [naftifine hcl] and Latex  Review of Systems   Review of Systems  Constitutional:  Negative for chills and fever.  Eyes:  Negative  for pain and visual disturbance.  Respiratory:  Negative for cough and shortness of breath.   Cardiovascular:  Negative for chest pain and palpitations.  Gastrointestinal:  Negative for abdominal pain and vomiting.  Genitourinary:  Negative for dysuria and hematuria.  Musculoskeletal:  Positive for arthralgias, myalgias and neck pain.  Skin:  Negative for color change and rash.  Neurological:  Positive for weakness and numbness. Negative for dizziness, syncope, speech difficulty, light-headedness and headaches.  All other systems reviewed and are negative.  Physical Exam Updated Vital Signs BP (!) 151/85 (BP Location: Right Arm)   Pulse 90   Temp 98 F (36.7 C) (Oral)   Resp 14   SpO2 98%   Physical Exam Constitutional:      General: She is not in acute distress. HENT:     Head: Normocephalic and atraumatic.  Eyes:     Conjunctiva/sclera: Conjunctivae normal.     Pupils: Pupils are equal, round, and reactive to light.  Cardiovascular:     Rate and Rhythm: Normal rate and regular rhythm.     Pulses: Normal pulses.  Pulmonary:     Effort: Pulmonary effort is normal. No respiratory distress.  Abdominal:     General: There is no distension.     Tenderness: There is no abdominal tenderness.  Musculoskeletal:     Comments: Tenderness to left posterior shoulder, pain with ROM at the left shoulder Trigger point trapezius tenderness, bilaterally  Skin:    General: Skin is warm and dry.  Neurological:     General: No focal deficit present.     Mental Status: She is alert and oriented to person, place, and time. Mental status is at baseline.     Cranial Nerves: No cranial nerve deficit.     Sensory: No sensory deficit.     Motor: No weakness.     Coordination: Coordination normal.  Psychiatric:        Mood and Affect: Mood normal.        Behavior: Behavior normal.    ED Results / Procedures / Treatments   Labs (all labs ordered are listed, but only abnormal results are  displayed) Labs Reviewed  BASIC METABOLIC PANEL - Abnormal; Notable for the following components:      Result Value   Glucose, Bld 270 (*)    All other components within normal limits  CBC  I-STAT BETA HCG BLOOD, ED ([facility], [facility], [facility] ONLY)  TROPONIN I (HIGH SENSITIVITY)  TROPONIN I (HIGH SENSITIVITY)    EKG EKG Interpretation  Date/Time:  Tuesday June 11 2021 14:27:06 EDT Ventricular Rate:  91 PR Interval:  146 QRS Duration: 68 QT Interval:  376 QTC Calculation: 462 R Axis:   31 Text Interpretation: Normal sinus rhythm Normal ECG Confirmed by Octaviano Glow [phone]) on 06/11/2021 6:52:59 PM  Radiology DG Chest 2 View  Result Date: 06/11/2021 CLINICAL DATA:  Chest pain EXAM: CHEST - 2 VIEW COMPARISON:  09/23/2020 FINDINGS: The heart size and mediastinal contours are within normal limits. Both lungs are clear. The visualized skeletal structures are unremarkable. IMPRESSION: No active cardiopulmonary disease. Electronically Signed   By: Donavan Foil M.D.   On: 06/11/2021 15:18   CT HEAD WO CONTRAST (5MM)  Result Date: 06/11/2021 CLINICAL DATA:  Neuro deficit, acute, stroke suspected left arm numbness, weakness EXAM: CT HEAD WITHOUT CONTRAST TECHNIQUE: Contiguous axial images were obtained from the base of the skull through the vertex without intravenous contrast. COMPARISON:  01/04/2010 FINDINGS: Brain: No acute intracranial abnormality. Specifically, no hemorrhage, hydrocephalus, mass lesion, acute infarction, or significant intracranial injury. Vascular: No hyperdense vessel or unexpected calcification. Skull: No acute calvarial abnormality. Sinuses/Orbits: No acute findings Other: None IMPRESSION: Normal study. Electronically Signed   By: Rolm Baptise M.D.   On: 06/11/2021 19:15    Procedures Procedures   Medications Ordered in ED Medications - No data to display  ED Course  I have reviewed the triage vital signs and the nursing notes.  Pertinent labs & imaging results that  were available during my care of the patient were reviewed by me and considered in my medical decision making (see chart for details).  Patient is here with left shoulder and left arm heaviness and weakness after unloading a truck earlier today.  Her exam at this time is more consistent with cervical radiculopathy or brachial impingement, given her focal tenderness on my exam.  She otherwise has excellent strength in the upper extremities, normal sensation at this time.  She has no other deficits on exam or per report.  DG chest reviewed, unremarkable - no evidence of PTX or PNA. BM EKG reviewed and interpreted showing a normal sinus rhythm, no acute ischemic findings.  Troponin is 5.  CBC unremarkable.  Have a very low suspicion at this point for aortic dissection, acute coronary syndrome, PE, pneumothorax.  I suspect this is likely cervical pain.  We did discuss CT scan of the brain to rule out CVA given her risk factors, scan pending  She also reports a spider bite 2 weeks ago on the right posterior mid back, I examined her did not see any erythema or evidence of infection at this time.  I do not think this is related to her symptoms.    [place] reviewed, unremarkable.  On reassessment patient has no new symptoms or complaints, is prepared to go home.  We discussed conservative management for shoulder/brachial impingement, rest for several days, and f/u care.  Advised f/u for hypertension and hyperglycemia as well.    Final Clinical Impression(s) / ED Diagnoses Final diagnoses:  Left arm numbness  Cervical radiculopathy  Hyperglycemia    Rx / DC Orders ED Discharge Orders          Ordered    methylPREDNISolone (MEDROL DOSEPAK) 4 MG TBPK tablet        06/11/21 1945    gabapentin (NEURONTIN) 100 MG capsule  3 times daily        06/11/21 1945    ibuprofen (ADVIL) 600 MG tablet  Every 6 hours PRN        06/11/21 1945    acetaminophen (TYLENOL) 325 MG  tablet  Every 6 hours PRN         06/11/21 1945             Wyvonnia Dusky, MD 06/12/21 1015

## 2021-06-11 NOTE — ED Notes (Signed)
DC instructions reviewed with pt. PT verbalized understanding. PT DC °

## 2021-09-23 ENCOUNTER — Encounter (HOSPITAL_COMMUNITY): Payer: Self-pay | Admitting: *Deleted

## 2021-09-23 ENCOUNTER — Emergency Department (HOSPITAL_COMMUNITY)
Admission: EM | Admit: 2021-09-23 | Discharge: 2021-09-23 | Disposition: A | Discharge status: Home / Self Care | Payer: 59 | Attending: Emergency Medicine | Admitting: Emergency Medicine

## 2021-09-23 ENCOUNTER — Emergency Department (HOSPITAL_COMMUNITY): Payer: 59

## 2021-09-23 ENCOUNTER — Other Ambulatory Visit: Payer: Self-pay

## 2021-09-23 DIAGNOSIS — E876 Hypokalemia: Secondary | ICD-10-CM | POA: Insufficient documentation

## 2021-09-23 DIAGNOSIS — R1031 Right lower quadrant pain: Secondary | ICD-10-CM | POA: Insufficient documentation

## 2021-09-23 DIAGNOSIS — R110 Nausea: Secondary | ICD-10-CM | POA: Diagnosis not present

## 2021-09-23 DIAGNOSIS — M545 Low back pain, unspecified: Secondary | ICD-10-CM | POA: Diagnosis not present

## 2021-09-23 DIAGNOSIS — I700 Atherosclerosis of aorta: Secondary | ICD-10-CM | POA: Diagnosis not present

## 2021-09-23 DIAGNOSIS — R109 Unspecified abdominal pain: Secondary | ICD-10-CM | POA: Diagnosis not present

## 2021-09-23 DIAGNOSIS — Z91040 Latex allergy status: Secondary | ICD-10-CM | POA: Insufficient documentation

## 2021-09-23 LAB — COMPREHENSIVE METABOLIC PANEL
ALT: 15 U/L (ref 0–44)
AST: 13 U/L — ABNORMAL LOW (ref 15–41)
Albumin: 4 g/dL (ref 3.5–5.0)
Alkaline Phosphatase: 86 U/L (ref 38–126)
Anion gap: 4 — ABNORMAL LOW (ref 5–15)
BUN: 11 mg/dL (ref 6–20)
CO2: 29 mmol/L (ref 22–32)
Calcium: 8.7 mg/dL — ABNORMAL LOW (ref 8.9–10.3)
Chloride: 105 mmol/L (ref 98–111)
Creatinine, Ser: 0.6 mg/dL (ref 0.44–1.00)
GFR, Estimated: >60 mL/min (ref >60)
Glucose, Bld: 205 mg/dL — ABNORMAL HIGH (ref 70–99)
Potassium: 3.4 mmol/L — ABNORMAL LOW (ref 3.5–5.1)
Sodium: 138 mmol/L (ref 135–145)
Total Bilirubin: 0.2 mg/dL — ABNORMAL LOW (ref 0.3–1.2)
Total Protein: 7.5 g/dL (ref 6.5–8.1)

## 2021-09-23 LAB — CBC
HCT: 39.3 % (ref 36.0–46.0)
Hemoglobin: 13.7 g/dL (ref 12.0–15.0)
MCH: 30 pg (ref 26.0–34.0)
MCHC: 34.9 g/dL (ref 30.0–36.0)
MCV: 86.2 fL (ref 80.0–100.0)
Platelets: 324 10*3/uL (ref 150–400)
RBC: 4.56 MIL/uL (ref 3.87–5.11)
RDW: 12 % (ref 11.5–15.5)
WBC: 7.6 10*3/uL (ref 4.0–10.5)
nRBC: 0 % (ref 0.0–0.2)

## 2021-09-23 LAB — URINALYSIS, ROUTINE W REFLEX MICROSCOPIC
Bilirubin Urine: NEGATIVE
Glucose, UA: NEGATIVE mg/dL
Ketones, ur: NEGATIVE mg/dL
Leukocytes,Ua: NEGATIVE
Nitrite: NEGATIVE
Protein, ur: NEGATIVE mg/dL
Specific Gravity, Urine: 1.025 (ref 1.005–1.030)
pH: 6 (ref 5.0–8.0)

## 2021-09-23 LAB — URINALYSIS, MICROSCOPIC (REFLEX)

## 2021-09-23 LAB — LIPASE, BLOOD: Lipase: 38 U/L (ref 11–51)

## 2021-09-23 MED ORDER — KETOROLAC TROMETHAMINE 30 MG/ML IJ SOLN
30.0000 mg | Freq: Once | INTRAMUSCULAR | Status: AC
  Administered 2021-09-23: 30 mg via INTRAMUSCULAR
  Filled 2021-09-23: qty 1

## 2021-09-23 MED ORDER — HYDROCODONE-ACETAMINOPHEN 5-325 MG PO TABS: 2.0000 | ORAL_TABLET | ORAL | 0 refills | Status: DC | PRN

## 2021-09-23 MED ORDER — HYDROCODONE-ACETAMINOPHEN 5-325 MG PO TABS: 1.0000 | ORAL_TABLET | ORAL | 0 refills | Status: AC | PRN

## 2021-09-23 MED ORDER — HYDROCODONE-ACETAMINOPHEN 5-325 MG PO TABS: 1.0000 | ORAL_TABLET | ORAL | 0 refills | Status: DC | PRN

## 2021-09-23 MED ORDER — ONDANSETRON 8 MG PO TBDP
8.0000 mg | ORAL_TABLET | Freq: Once | ORAL | Status: AC
  Administered 2021-09-23: 8 mg via ORAL
  Filled 2021-09-23: qty 1

## 2021-09-23 MED ORDER — HYDROMORPHONE HCL 1 MG/ML IJ SOLN
1.0000 mg | Freq: Once | INTRAMUSCULAR | Status: AC
  Administered 2021-09-23: 1 mg via INTRAMUSCULAR
  Filled 2021-09-23: qty 1

## 2021-09-23 NOTE — Discharge Instructions (Addendum)
Unfortunately, we did not find any underlying cause to your symptoms today. Your labs were reassuring as well as your imaging that you had done today. I am sending a few days worth of pain medication to your pharmacy. Please only take this as prescribed. Do not take this prior to driving or operating heavy machinery. Please be careful taking opioid medications because they can cause respiratory depression or can be addictive if taking inappropriately or in large quantities. You can also take tylenol or ibuprofen every 4-6 hours for your pain.   Please return to the ED to be reevaluated in 4-5 days if your symptoms are not improving. If your symptoms worsen significantly before then, please return to the ED. Please return to the ER sooner if you have any new or worsening symptoms, or if you have any of the following symptoms:  Abdominal pain that does not go away.  You have a fever.  You keep throwing up (vomiting).  The pain is felt only in portions of the abdomen. Pain in the right side could possibly be appendicitis. In an adult, pain in the left lower portion of the abdomen could be colitis or diverticulitis.  You pass bloody or black tarry stools.  There is bright red blood in the stool.  The constipation stays for more than 4 days.  There is belly (abdominal) or rectal pain.  You do not seem to be getting better.  You have any questions or concerns.

## 2021-09-23 NOTE — ED Triage Notes (Signed)
Right lower quadrant pain onset yesterday, denies urinary problems

## 2021-09-23 NOTE — ED Provider Notes (Signed)
[facility] EMERGENCY DEPARTMENT Provider Note   CSN: [id number] Arrival date & time: 09/23/21  1142     History  Chief Complaint  Patient presents with   Abdominal Pain    Carol [name] is a 56 y.o. female.  Patient has history of cholecystectomy and tubal ligation.  Patient presents emergency department with right lower quadrant abdominal pain that started yesterday.  She states that the pain has been constant but the intensity has waxed and waned.  Says the pain radiates to her right lower back.  It has gradually worsened since onset.  She denies any fevers or chills.  She has associated nausea with no vomiting.  She denies dysuria or hematuria.  She denies any constipation or diarrhea.   Abdominal Pain Associated symptoms: nausea   Associated symptoms: no constipation, no diarrhea, no dysuria, no hematuria and no vomiting       Home Medications Prior to Admission medications   Medication Sig Start Date End Date Taking? Authorizing Provider  acetaminophen (TYLENOL) 325 MG tablet Take 2 tablets (650 mg total) by mouth every 6 (six) hours as needed for up to 30 doses for moderate pain or mild pain. 06/11/21   Wyvonnia Dusky, MD  doxycycline (VIBRAMYCIN) 100 MG capsule Take 1 capsule (100 mg total) by mouth 2 (two) times daily. One po bid x 7 days 07/06/20   Mesner, Corene Cornea, MD  gabapentin (NEURONTIN) 100 MG capsule Take 1 capsule (100 mg total) by mouth 3 (three) times daily. 06/11/21 07/11/21  Wyvonnia Dusky, MD  HYDROcodone-acetaminophen (NORCO/VICODIN) 5-325 MG tablet Take 1 tablet by mouth every 4 (four) hours as needed for up to 5 days. 09/23/21 09/28/21  Clee Pandit, Adora Fridge, PA-C  ibuprofen (ADVIL) 600 MG tablet Take 1 tablet (600 mg total) by mouth every 6 (six) hours as needed for up to 30 doses for mild pain or moderate pain. 06/11/21   Wyvonnia Dusky, MD  methylPREDNISolone (MEDROL DOSEPAK) 4 MG TBPK tablet Take as directed on package 06/12/21   Wyvonnia Dusky, MD   metroNIDAZOLE (FLAGYL) 500 MG tablet Take 1 tablet (500 mg total) by mouth 2 (two) times daily. One po bid x 7 days 07/06/20   Mesner, Corene Cornea, MD      Allergies    Naftin [naftifine hcl] and Latex    Review of Systems   Review of Systems  Gastrointestinal:  Positive for abdominal pain and nausea. Negative for blood in stool, constipation, diarrhea and vomiting.  Genitourinary:  Positive for flank pain. Negative for dysuria and hematuria.  All other systems reviewed and are negative.  Physical Exam Updated Vital Signs BP (!) 157/68 (BP Location: Right Arm)    Pulse 62    Temp 98.2 F (36.8 C) (Oral)    Resp 17    SpO2 100%  Physical Exam Vitals and nursing note reviewed.  Constitutional:      General: She is not in acute distress.    Appearance: Normal appearance. She is not ill-appearing, toxic-appearing or diaphoretic.  HENT:     Head: Normocephalic and atraumatic.     Nose: No nasal deformity.     Mouth/Throat:     Lips: Pink. No lesions.     Mouth: No injury, lacerations, oral lesions or angioedema.     Pharynx: Uvula midline. No pharyngeal swelling or uvula swelling.  Eyes:     General: Gaze aligned appropriately. No scleral icterus.       Right eye: No discharge.  Left eye: No discharge.     Conjunctiva/sclera: Conjunctivae normal.     Right eye: Right conjunctiva is not injected. No exudate or hemorrhage.    Left eye: Left conjunctiva is not injected. No exudate or hemorrhage. Cardiovascular:     Rate and Rhythm: Normal rate and regular rhythm.     Pulses: Normal pulses.          Radial pulses are 2+ on the right side and 2+ on the left side.       Dorsalis pedis pulses are 2+ on the right side and 2+ on the left side.     Heart sounds: Normal heart sounds, S1 normal and S2 normal. Heart sounds not distant. No murmur heard.   No friction rub. No gallop. No S3 or S4 sounds.  Pulmonary:     Effort: Pulmonary effort is normal. No accessory muscle usage or  respiratory distress.     Breath sounds: Normal breath sounds. No stridor. No wheezing, rhonchi or rales.  Chest:     Chest wall: No tenderness.  Abdominal:     General: Abdomen is flat. There is no distension.     Palpations: Abdomen is soft. There is no mass or pulsatile mass.     Tenderness: There is abdominal tenderness. There is right CVA tenderness and guarding. There is no left CVA tenderness or rebound.     Comments: Patient with moderate right lower quadrant abdominal pain to palpation.  She had pretty significant right CVA tenderness as well.  There is no other quadrants with tenderness.  Musculoskeletal:     Right lower leg: No edema.     Left lower leg: No edema.  Skin:    General: Skin is warm and dry.     Coloration: Skin is not jaundiced or pale.     Findings: No bruising, erythema, lesion or rash.  Neurological:     General: No focal deficit present.     Mental Status: She is alert and oriented to person, place, and time.     GCS: GCS eye subscore is 4. GCS verbal subscore is 5. GCS motor subscore is 6.  Psychiatric:        Mood and Affect: Mood normal.        Behavior: Behavior normal. Behavior is cooperative.    ED Results / Procedures / Treatments   Labs (all labs ordered are listed, but only abnormal results are displayed) Labs Reviewed  COMPREHENSIVE METABOLIC PANEL - Abnormal; Notable for the following components:      Result Value   Potassium 3.4 (*)    Glucose, Bld 205 (*)    Calcium 8.7 (*)    AST 13 (*)    Total Bilirubin 0.2 (*)    Anion gap 4 (*)    All other components within normal limits  URINALYSIS, ROUTINE W REFLEX MICROSCOPIC - Abnormal; Notable for the following components:   Hgb urine dipstick LARGE (*)    All other components within normal limits  URINALYSIS, MICROSCOPIC (REFLEX) - Abnormal; Notable for the following components:   Bacteria, UA FEW (*)    All other components within normal limits  LIPASE, BLOOD  CBC     EKG None  Radiology CT Abdomen Pelvis Wo Contrast  Result Date: 09/23/2021 CLINICAL DATA:  Right lower quadrant abdominal pain EXAM: CT ABDOMEN AND PELVIS WITHOUT CONTRAST TECHNIQUE: Multidetector CT imaging of the abdomen and pelvis was performed following the standard protocol without IV contrast. RADIATION DOSE REDUCTION: This exam  was performed according to the departmental dose-optimization program which includes automated exposure control, adjustment of the mA and/or kV according to patient size and/or use of iterative reconstruction technique. COMPARISON:  CT abdomen and pelvis dated January 30, 2018 FINDINGS: Lower chest: No acute abnormality. Hepatobiliary: No focal liver abnormality is seen. Status post cholecystectomy. No biliary dilatation. Pancreas: Unremarkable. No pancreatic ductal dilatation or surrounding inflammatory changes. Spleen: Normal in size without focal abnormality. Adrenals/Urinary Tract: Adrenal glands are unremarkable. Kidneys are normal, without renal calculi, focal lesion, or hydronephrosis. Bladder is unremarkable. Stomach/Bowel: Stomach is within normal limits. Appendix appears normal. No evidence of bowel wall thickening, distention, or inflammatory changes. Vascular/Lymphatic: Aortic atherosclerosis. No enlarged abdominal or pelvic lymph nodes. Reproductive: No adnexal masses. Pelvic calcifications which are likely phleboliths. Other: No abdominal wall hernia or abnormality. No abdominopelvic ascites. Musculoskeletal: No acute or significant osseous findings. IMPRESSION: 1. No acute findings in the abdomen or pelvis including no evidence of acute appendicitis or obstructive uropathy. 2.  Aortic Atherosclerosis (ICD10-I70.0). Electronically Signed   By: Yetta Glassman M.D.   On: 09/23/2021 16:17   US Transvaginal Non-OB  Result Date: 09/23/2021 CLINICAL DATA:  Evaluate for torsion. EXAM: TRANSABDOMINAL AND TRANSVAGINAL ULTRASOUND OF PELVIS DOPPLER ULTRASOUND OF OVARIES  TECHNIQUE: Both transabdominal and transvaginal ultrasound examinations of the pelvis were performed. Transabdominal technique was performed for global imaging of the pelvis including uterus, ovaries, adnexal regions, and pelvic cul-de-sac. It was necessary to proceed with endovaginal exam following the transabdominal exam to visualize the ovaries and endometrium. Color and duplex Doppler ultrasound was utilized to evaluate blood flow to the ovaries. COMPARISON:  CT abdomen and pelvis 09/23/2021 FINDINGS: Uterus Surgically absent. Right ovary Not visualized.  No adnexal mass. Left ovary Visualized.  No adnexal mass. Pulsed Doppler evaluation of both ovaries was not obtained. Other findings No abnormal free fluid. IMPRESSION: 1. Hysterectomy. 2. The bilateral ovaries are not visualized.  No pelvic free fluid. Electronically Signed   By: Ronney Asters M.D.   On: 09/23/2021 17:30   US Pelvis Complete  Result Date: 09/23/2021 CLINICAL DATA:  Evaluate for torsion. EXAM: TRANSABDOMINAL AND TRANSVAGINAL ULTRASOUND OF PELVIS DOPPLER ULTRASOUND OF OVARIES TECHNIQUE: Both transabdominal and transvaginal ultrasound examinations of the pelvis were performed. Transabdominal technique was performed for global imaging of the pelvis including uterus, ovaries, adnexal regions, and pelvic cul-de-sac. It was necessary to proceed with endovaginal exam following the transabdominal exam to visualize the ovaries and endometrium. Color and duplex Doppler ultrasound was utilized to evaluate blood flow to the ovaries. COMPARISON:  CT abdomen and pelvis 09/23/2021 FINDINGS: Uterus Surgically absent. Right ovary Not visualized.  No adnexal mass. Left ovary Visualized.  No adnexal mass. Pulsed Doppler evaluation of both ovaries was not obtained. Other findings No abnormal free fluid. IMPRESSION: 1. Hysterectomy. 2. The bilateral ovaries are not visualized.  No pelvic free fluid. Electronically Signed   By: Ronney Asters M.D.   On: 09/23/2021  17:30    Procedures Procedures   Medications Ordered in ED Medications  ketorolac (TORADOL) 30 MG/ML injection 30 mg (30 mg Intramuscular Given 09/23/21 1552)  ondansetron (ZOFRAN-ODT) disintegrating tablet 8 mg (8 mg Oral Given 09/23/21 1552)  HYDROmorphone (DILAUDID) injection 1 mg (1 mg Intramuscular Given 09/23/21 1658)    ED Course/ Medical Decision Making/ A&P                           Medical Decision Making Problems Addressed: Right lower quadrant  abdominal pain: acute illness or injury  Amount and/or Complexity of Data Reviewed External Data Reviewed: labs, radiology and notes. Labs: ordered. Decision-making details documented in ED Course. Radiology: ordered and independent interpretation performed. Decision-making details documented in ED Course.  Risk Prescription drug management. Decision regarding hospitalization.   This is a 55 y.o. female with a PMH of cholecystectomy and tubal ligation who presents to the ED with right lower quadrant abdominal pain radiating to right flank since yesterday.   Vitals stable Exam with pretty significant right CVA tenderness and RLQ.  Ddx includes pyelonephritis, UTI, appendicitis, ovarian cyst, ovarian torsion, constipation, diverticulitis, nephro or urolithiasis.   Plan to obtain CT a/p.   I personally reviewed all laboratory work and imaging. Abnormal results outlined below.  CBC reassuring. CMP with minimal hypokalemia, glucose 205 with no acidosis or anion gap. Lipase negative. UA with large blood and minimal bacterial. CT did not reveal appendicitis or obstructive stone. Patient was unsure if she had ovaries removed, so I decided to obtain a transvaginal ultrasound to r/o torsion since patient was still having pain. Surgically absent ovaries/uterus evident on this image.  Overall, unrevealing workup. On reassessment pain is still present, but improved after medication. I discussed the negative imaging with the patient. At this  point, there is no indication for hospitalization or further investigation. It is possible that patient's presentation is musculoskeletal or too early of a presentation to be revealed on imaging. She should be reevaluated in 4-5 days if her symptoms have not returned. She should return to the ED if her symptoms worsen. Pain medication sent to pharmacy.    Portions of this note were generated with [employer]. Dictation errors may occur despite best attempts at proofreading.  Final Clinical Impression(s) / ED Diagnoses Final diagnoses:  Right lower quadrant abdominal pain    Rx / DC Orders ED Discharge Orders          Ordered    HYDROcodone-acetaminophen (NORCO/VICODIN) 5-325 MG tablet  Every 4 hours PRN,   Status:  Discontinued        09/23/21 1803    HYDROcodone-acetaminophen (NORCO/VICODIN) 5-325 MG tablet  Every 4 hours PRN,   Status:  Discontinued        09/23/21 1804    HYDROcodone-acetaminophen (NORCO/VICODIN) 5-325 MG tablet  Every 4 hours PRN        09/23/21 1912              Sheila Oats 09/23/21 2348    Margette Fast, MD 10/01/21 [phone]

## 2021-10-17 ENCOUNTER — Emergency Department (HOSPITAL_COMMUNITY): Payer: 59

## 2021-10-17 ENCOUNTER — Emergency Department (HOSPITAL_COMMUNITY)
Admission: EM | Admit: 2021-10-17 | Discharge: 2021-10-17 | Disposition: A | Discharge status: Home / Self Care | Payer: 59 | Attending: Emergency Medicine | Admitting: Emergency Medicine

## 2021-10-17 ENCOUNTER — Other Ambulatory Visit: Payer: Self-pay

## 2021-10-17 ENCOUNTER — Encounter (HOSPITAL_COMMUNITY): Payer: Self-pay

## 2021-10-17 DIAGNOSIS — D171 Benign lipomatous neoplasm of skin and subcutaneous tissue of trunk: Secondary | ICD-10-CM | POA: Diagnosis not present

## 2021-10-17 DIAGNOSIS — Z91040 Latex allergy status: Secondary | ICD-10-CM | POA: Insufficient documentation

## 2021-10-17 DIAGNOSIS — R0789 Other chest pain: Secondary | ICD-10-CM | POA: Diagnosis not present

## 2021-10-17 DIAGNOSIS — D179 Benign lipomatous neoplasm, unspecified: Secondary | ICD-10-CM | POA: Diagnosis not present

## 2021-10-17 DIAGNOSIS — M546 Pain in thoracic spine: Secondary | ICD-10-CM | POA: Diagnosis not present

## 2021-10-17 NOTE — ED Provider Notes (Signed)
?[facility] ?Provider Note ? ? ?CSN: [id number] ?Arrival date & time: 10/17/21  1737 ? ?  ? ?History ? ?Chief Complaint  ?Patient presents with  ? Mass  ? ? ?Carol [name] is a 56 y.o. female. ? ?Patient complains of swelling to her left upper back.  Patient has a history of low back problems no other medical problems ? ?The history is provided by the patient and medical records. No language interpreter was used.  ?Back Pain ?Location:  Thoracic spine ?Quality:  Aching ?Radiates to:  Does not radiate ?Pain severity:  Mild ?Pain is:  Same all the time ?Onset quality:  Sudden ?Timing:  Constant ?Progression:  Waxing and waning ?Chronicity:  New ?Context: not emotional stress   ?Associated symptoms: no abdominal pain, no chest pain and no headaches   ? ?  ? ?Home Medications ?Prior to Admission medications   ?Medication Sig Start Date End Date Taking? Authorizing Provider  ?acetaminophen (TYLENOL) 325 MG tablet Take 2 tablets (650 mg total) by mouth every 6 (six) hours as needed for up to 30 doses for moderate pain or mild pain. 06/11/21   Wyvonnia Dusky, MD  ?doxycycline (VIBRAMYCIN) 100 MG capsule Take 1 capsule (100 mg total) by mouth 2 (two) times daily. One po bid x 7 days 07/06/20   Mesner, Corene Cornea, MD  ?gabapentin (NEURONTIN) 100 MG capsule Take 1 capsule (100 mg total) by mouth 3 (three) times daily. 06/11/21 07/11/21  Wyvonnia Dusky, MD  ?ibuprofen (ADVIL) 600 MG tablet Take 1 tablet (600 mg total) by mouth every 6 (six) hours as needed for up to 30 doses for mild pain or moderate pain. 06/11/21   Wyvonnia Dusky, MD  ?methylPREDNISolone (MEDROL DOSEPAK) 4 MG TBPK tablet Take as directed on package 06/12/21   Wyvonnia Dusky, MD  ?metroNIDAZOLE (FLAGYL) 500 MG tablet Take 1 tablet (500 mg total) by mouth 2 (two) times daily. One po bid x 7 days 07/06/20   Mesner, Corene Cornea, MD  ?   ? ?Allergies    ?Naftin [naftifine hcl] and Latex   ? ?Review of Systems   ?Review of Systems   ?Constitutional:  Negative for appetite change and fatigue.  ?HENT:  Negative for congestion, ear discharge and sinus pressure.   ?Eyes:  Negative for discharge.  ?Respiratory:  Negative for cough.   ?Cardiovascular:  Negative for chest pain.  ?Gastrointestinal:  Negative for abdominal pain and diarrhea.  ?Genitourinary:  Negative for frequency and hematuria.  ?Musculoskeletal:  Positive for back pain.  ?Skin:  Negative for rash.  ?Neurological:  Negative for seizures and headaches.  ?Psychiatric/Behavioral:  Negative for hallucinations.   ? ?Physical Exam ?Updated Vital Signs ?BP (!) 152/81 (BP Location: Right Arm)   Pulse 81   Temp 98.3 ?F (36.8 ?C) (Oral)   Resp 16   Ht 5\' 7"  (1.702 m)   Wt 74.8 kg   SpO2 100%   BMI 25.84 kg/m?  ?Physical Exam ?Vitals and nursing note reviewed.  ?Constitutional:   ?   Appearance: She is well-developed.  ?HENT:  ?   Head: Normocephalic.  ?Eyes:  ?   General: No scleral icterus. ?   Conjunctiva/sclera: Conjunctivae normal.  ?Neck:  ?   Thyroid: No thyromegaly.  ?Cardiovascular:  ?   Rate and Rhythm: Normal rate and regular rhythm.  ?   Heart sounds: No murmur heard. ?  No friction rub. No gallop.  ?Pulmonary:  ?   Breath sounds: No stridor.  No wheezing or rales.  ?Chest:  ?   Chest wall: No tenderness.  ?Abdominal:  ?   General: There is no distension.  ?   Tenderness: There is no abdominal tenderness. There is no rebound.  ?Musculoskeletal:     ?   General: Normal range of motion.  ?   Cervical back: Neck supple.  ?   Comments: Swelling and minimal tenderness to left upper  ?Lymphadenopathy:  ?   Cervical: No cervical adenopathy.  ?Skin: ?   Findings: No erythema or rash.  ?Neurological:  ?   Mental Status: She is alert and oriented to person, place, and time.  ?   Motor: No abnormal muscle tone.  ?   Coordination: Coordination normal.  ?Psychiatric:     ?   Behavior: Behavior normal.  ? ? ?ED Results / Procedures / Treatments   ?Labs ?(all labs ordered are listed, but  only abnormal results are displayed) ?Labs Reviewed - No data to display ? ?EKG ?None ? ?Radiology ?CT Chest Wo Contrast ? ?Result Date: 10/17/2021 ?CLINICAL DATA:  Left axillary lump.  Chest wall pain. EXAM: CT CHEST WITHOUT CONTRAST TECHNIQUE: Multidetector CT imaging of the chest was performed following the standard protocol without IV contrast. RADIATION DOSE REDUCTION: This exam was performed according to the departmental dose-optimization program which includes automated exposure control, adjustment of the mA and/or kV according to patient size and/or use of iterative reconstruction technique. COMPARISON:  CT abdomen 09/23/2021.  CT abdomen 2013. FINDINGS: Cardiovascular: Hard size is normal. No coronary artery calcification or aortic atherosclerotic calcification. Mediastinum/Nodes: No sign of mediastinal or hilar mass or adenopathy on this noncontrast exam. Lungs/Pleura: No pleural effusion. The lungs are clear. No infiltrate, collapse, mass or nodule. Few small areas of linear pulmonary scarring. Upper Abdomen: Negative Musculoskeletal: There is an extra thoracic submuscular fatty tumor presumed represent lipoma along the left lateral chest wall from the level of the fourth rib through the level of the seventh rib. This extends over a cephalo caudal length of 7.7 cm, as a front to back dimension of 6.8 cm and a side to side dimension of 2.2 cm. The inferior aspect of this can be seen on CT scans as distant as 2013 and the size appears similar at the very lowest extent. This predicts a benign or indolent behavior there are no soft tissue density components or septations. No sign of there is invasion of the actual chest wall or overlying muscles. 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 All features appear benign/indolent, though fat tumors cannot be accurately graded by CT. The inferior aspect of this tumor can be seen as distant as 2013,  predicting a benign or indolent behavior. Electronically Signed   By: Nelson Chimes M.D.   On: 10/17/2021 22:01   ? ?Procedures ?Procedures  ? ? ?Medications Ordered in ED ?Medications - No data to display ? ?ED Course/ Medical Decision Making/ A&P ?  ?                        ?Medical Decision Making ?Amount and/or Complexity of Data Reviewed ?Radiology: ordered. ? ? ?This patient presents to the ED for concern of back pain, this involves an extensive number of treatment options, and is a complaint that carries with it a high risk of complications and morbidity.  The differential diagnosis includes muscle inflammation, fatty tumor ? ? ?Co  morbidities that complicate the patient evaluation ? ?None ? ? ?Additional history obtained: ? ?Additional history obtained from patient ?External records from outside source obtained and reviewed including hospital record ? ? ?Lab Tests: ? ?No lab ? ?Imaging Studies ordered: ? ?I ordered imaging studies including CT chest ?I independently visualized and interpreted imaging which showed fatty tumor seen in left upper back ?I agree with the radiologist interpretation ? ? ?Cardiac Monitoring: ?No monitor ?Medicines ordered and prescription drug management: ?No medicine ? ?Test Considered: ? ?None ? ? ?Critical Interventions: ? ?None ? ? ?Consultations Obtained: ? ?No consult ?Problem List / ED Course: ? ?Back pain with fatty tumor ? ? ? ?Social Determinants of Health: ? ?None ? ? ? ? ? 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  She will follow-up with general surgery for discussion of possible removing this ? ? ? ? ? ? ? ?Final Clinical Impression(s) / ED Diagnoses ?Final diagnoses:  ?Lipoma of torso  ? ? ?Rx / DC Orders ?ED Discharge Orders   ? ? None  ? ?  ? ? ?  ?Milton Ferguson, MD ?10/19/21 [phone] ? ?

## 2021-10-17 NOTE — Discharge Instructions (Signed)
Follow-up with Dr. Okey Dupre or one of her partners to discuss possible removal of this fatty tumor ?

## 2021-10-17 NOTE — ED Triage Notes (Signed)
Complains of lump she noticed behind left axillary which appears to be a muscle.  ?

## 2021-10-21 ENCOUNTER — Other Ambulatory Visit: Payer: Self-pay | Admitting: *Deleted

## 2021-10-21 DIAGNOSIS — D171 Benign lipomatous neoplasm of skin and subcutaneous tissue of trunk: Secondary | ICD-10-CM

## 2021-11-11 ENCOUNTER — Institutional Professional Consult (permissible substitution): Payer: 59 | Admitting: Plastic Surgery

## 2021-12-02 ENCOUNTER — Ambulatory Visit (INDEPENDENT_AMBULATORY_CARE_PROVIDER_SITE_OTHER): Payer: Self-pay | Admitting: Plastic Surgery

## 2021-12-02 ENCOUNTER — Other Ambulatory Visit (HOSPITAL_COMMUNITY): Payer: Self-pay | Admitting: Plastic Surgery

## 2021-12-02 ENCOUNTER — Encounter: Payer: Self-pay | Admitting: Plastic Surgery

## 2021-12-02 VITALS — HR 85 | Wt 153.4 lb

## 2021-12-02 DIAGNOSIS — D1720 Benign lipomatous neoplasm of skin and subcutaneous tissue of unspecified limb: Secondary | ICD-10-CM | POA: Insufficient documentation

## 2021-12-02 DIAGNOSIS — R2232 Localized swelling, mass and lump, left upper limb: Secondary | ICD-10-CM

## 2021-12-02 DIAGNOSIS — D1722 Benign lipomatous neoplasm of skin and subcutaneous tissue of left arm: Secondary | ICD-10-CM

## 2021-12-02 NOTE — Progress Notes (Signed)
? ?  Patient ID: Carol [name], female    DOB: March 25, 1966, 56 y.o.   MRN: [id number] ? ? ?Chief Complaint  ?Patient presents with  ? Advice Only  ? ? ?The patient is a 56 year old female here for evaluation of her left axilla.  She is 153 pounds.  She has noticed increase bulk on the left axilla at the posterior aspect for over a year now.  She says it is a little bit tender.  Her last mammogram was 2 years ago and was negative.  She had a hysterectomy but otherwise no surgeries.  It is not is getting more tender and larger with time.  She is not aware of any trauma to the area.  It is noticeable on exam and approximately 6 x 6 cm in size.  It is soft and slightly tender to manipulation.  No axillary lymphadenopathy noted. ? ? ?Review of Systems  ?Constitutional: Negative.   ?HENT: Negative.    ?Eyes: Negative.   ?Respiratory: Negative.  Negative for chest tightness and shortness of breath.   ?Cardiovascular: Negative.  Negative for leg swelling.  ?Endocrine: Negative.   ?Genitourinary: Negative.   ?Musculoskeletal: Negative.   ?Neurological: Negative.   ?Hematological: Negative.   ?Psychiatric/Behavioral: Negative.    ? ?Past Medical History:  ?Diagnosis Date  ? Back pain   ? Bronchitis   ? Cervicalgia   ? History of blood in urine   ? microscopic  ? History of migraine headaches   ? Impaired fasting glucose   ?  ?Past Surgical History:  ?Procedure Laterality Date  ? ABDOMINAL HYSTERECTOMY  2002  ? CHOLECYSTECTOMY    ? TOE ARTHROPLASTY Bilateral 05/16/2020  ? Procedure: DEROTATIONAL ARTHROPLASTY OF FIFTH DIGIT RIGHT FOOT,DEROTATIONAL ARTHROPLASTY OF FIFTH DIGIT LEFT FOOT,EXOSTECTOMY OF FIFTH DIGIT RIGHT FOOT AND LEFT FOOT;  Surgeon: Caprice Beaver, DPM;  Location: [facility];  Service: Podiatry;  Laterality: Bilateral;  ? TUBAL LIGATION    ?  ? ? ?Current Outpatient Medications:  ?  acetaminophen (TYLENOL) 325 MG tablet, Take 2 tablets (650 mg total) by mouth every 6 (six) hours as needed for up to 30 doses for  moderate pain or mild pain., Disp: 30 tablet, Rfl: 0 ?  doxycycline (VIBRAMYCIN) 100 MG capsule, Take 1 capsule (100 mg total) by mouth 2 (two) times daily. One po bid x 7 days, Disp: 14 capsule, Rfl: 0 ?  gabapentin (NEURONTIN) 100 MG capsule, Take 1 capsule (100 mg total) by mouth 3 (three) times daily., Disp: 30 capsule, Rfl: 0 ?  ibuprofen (ADVIL) 600 MG tablet, Take 1 tablet (600 mg total) by mouth every 6 (six) hours as needed for up to 30 doses for mild pain or moderate pain., Disp: 30 tablet, Rfl: 0 ?  methylPREDNISolone (MEDROL DOSEPAK) 4 MG TBPK tablet, Take as directed on package, Disp: 21 tablet, Rfl: 0 ?  metroNIDAZOLE (FLAGYL) 500 MG tablet, Take 1 tablet (500 mg total) by mouth 2 (two) times daily. One po bid x 7 days, Disp: 14 tablet, Rfl: 0  ? ?Objective:  ? ?Vitals:  ? 12/02/21 0942  ?Pulse: 85  ?SpO2: 100%  ? ? ?Physical Exam ?Vitals reviewed.  ?Constitutional:   ?   Appearance: Normal appearance.  ?HENT:  ?   Head: Normocephalic and atraumatic.  ?Cardiovascular:  ?   Rate and Rhythm: Normal rate.  ?   Pulses: Normal pulses.  ?Pulmonary:  ?   Effort: Pulmonary effort is normal.  ?Abdominal:  ?   General: There  is no distension.  ?   Palpations: Abdomen is soft.  ?   Tenderness: There is no abdominal tenderness.  ?Musculoskeletal:  ?     Arms: ? ?Skin: ?   General: Skin is warm.  ?   Capillary Refill: Capillary refill takes less than 2 seconds.  ?   Coloration: Skin is not jaundiced.  ?   Findings: No bruising.  ?Neurological:  ?   Mental Status: She is alert and oriented to person, place, and time.  ?Psychiatric:     ?   Mood and Affect: Mood normal.     ?   Behavior: Behavior normal.     ?   Thought Content: Thought content normal.  ? ? ?Assessment & Plan:  ?Mass of left axilla - Plan: MM Digital Diagnostic Bilat ? ?Lipoma of left axilla ? ?Recommend excision but will need updated mammogram and we will get her sent for that prior to the surgery but I will go ahead and send this to [facility].  The  plan will be for excision of left axillary lipoma. ? ?Pictures were obtained of the patient and placed in the chart with the patient's or guardian's permission. ? ? ?Loel Lofty Tomasz Steeves, DO ?

## 2021-12-06 ENCOUNTER — Telehealth: Payer: Self-pay | Admitting: Plastic Surgery

## 2021-12-06 NOTE — Telephone Encounter (Signed)
Tried to call patient to find out if she has health insurance or would be self pay for surgery. No answer; phone just rang out.  ?

## 2021-12-20 ENCOUNTER — Telehealth: Payer: Self-pay | Admitting: Plastic Surgery

## 2021-12-20 NOTE — Telephone Encounter (Signed)
LVM to find out if patient has health insurance or will be having surgery as a self pay patient. Advised for her to call back and advise of insurance co, and insurance id number.  ?

## 2021-12-24 ENCOUNTER — Other Ambulatory Visit (HOSPITAL_COMMUNITY): Payer: Self-pay | Admitting: Plastic Surgery

## 2021-12-24 ENCOUNTER — Ambulatory Visit (HOSPITAL_COMMUNITY)
Admission: RE | Admit: 2021-12-24 | Discharge: 2021-12-24 | Disposition: A | Discharge status: Home / Self Care | Payer: 59 | Source: Ambulatory Visit | Attending: Plastic Surgery | Admitting: Plastic Surgery

## 2021-12-24 DIAGNOSIS — D179 Benign lipomatous neoplasm, unspecified: Secondary | ICD-10-CM | POA: Diagnosis not present

## 2021-12-24 DIAGNOSIS — R2232 Localized swelling, mass and lump, left upper limb: Secondary | ICD-10-CM

## 2021-12-31 ENCOUNTER — Ambulatory Visit: Payer: 59 | Admitting: Student

## 2021-12-31 DIAGNOSIS — D1722 Benign lipomatous neoplasm of skin and subcutaneous tissue of left arm: Secondary | ICD-10-CM

## 2021-12-31 NOTE — Progress Notes (Signed)
   Referring Provider No referring provider defined for this encounter.   CC: Follow up after updated mammogram      Carol [name] is an 56 y.o. female.  HPI: Patient is a 56 y.o. year old female here for follow up after completing her mammogram. Updated mammogram completed on 12/24/2021 showed no evidence of malignancy in either breast, but showed submuscular/intramuscular lipoma along the left lateral thorax.  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  She was seen for initial consult by Dr. Marla Roe on 12/02/2021.  At that time, she came to the clinic for evaluation of her left axilla.  Patient reported at this visit that she noticed a small bulky area in her left axilla.  On exam, the mass was found to be 6 x 6 cm in size, soft and slightly tender to touch.    Today, patient is doing well. She states that she feels the lipoma has moved a little bit inferiorly. Patient has no other concerns or complaints. We discussed that her mammogram was negative for malignancy.  Patient would like to move forward with excision of her lipoma.    Review of Systems General: no changes in interim health Skin: tender mass to left axilla, feels that it has shifted inferiorly  Physical Exam    12/02/2021    9:42 AM 10/17/2021   10:38 PM 10/17/2021    6:00 PM  Vitals with BMI  Height   '5\' 7"'$   Weight 153 lbs 6 oz  165 lbs  BMI   86.57  Systolic  846   Diastolic  962   Pulse 85 78     General:  No acute distress,  Alert and oriented, Non-Toxic, Normal speech and affect Psych: Normal behavior and mood Respiratory: No increased WOB MSK: Ambulatory Skin: Soft mass noted to the left axilla.    Assessment/Plan  Patient is interested in pursuing surgical intervention for excision of lipoma.   Discussed with patient that since her updated mammogram was negative for malignancy in the  breasts bilaterally, we will work on getting her scheduled for surgery. Also discussed that she will have an appointment prior to surgery to discuss the procedure, risks, and postop care.  Patient acknowledged.  Pictures were obtained and put in the chart with the patient's permission.  Plan discussed with Dr. Staci Acosta 12/31/2021, 2:18 PM

## 2022-01-22 ENCOUNTER — Emergency Department (HOSPITAL_COMMUNITY): Payer: 59

## 2022-01-22 ENCOUNTER — Other Ambulatory Visit: Payer: Self-pay

## 2022-01-22 ENCOUNTER — Emergency Department (HOSPITAL_COMMUNITY)
Admission: EM | Admit: 2022-01-22 | Discharge: 2022-01-22 | Disposition: A | Discharge status: Home / Self Care | Payer: 59 | Attending: Emergency Medicine | Admitting: Emergency Medicine

## 2022-01-22 ENCOUNTER — Encounter (HOSPITAL_COMMUNITY): Payer: Self-pay | Admitting: Emergency Medicine

## 2022-01-22 DIAGNOSIS — Z91040 Latex allergy status: Secondary | ICD-10-CM | POA: Diagnosis not present

## 2022-01-22 DIAGNOSIS — S46911A Strain of unspecified muscle, fascia and tendon at shoulder and upper arm level, right arm, initial encounter: Secondary | ICD-10-CM | POA: Diagnosis not present

## 2022-01-22 DIAGNOSIS — T148XXA Other injury of unspecified body region, initial encounter: Secondary | ICD-10-CM

## 2022-01-22 DIAGNOSIS — X500XXA Overexertion from strenuous movement or load, initial encounter: Secondary | ICD-10-CM | POA: Diagnosis not present

## 2022-01-22 DIAGNOSIS — S4991XA Unspecified injury of right shoulder and upper arm, initial encounter: Secondary | ICD-10-CM | POA: Diagnosis present

## 2022-01-22 MED ORDER — IBUPROFEN 800 MG PO TABS: 800.0000 mg | ORAL_TABLET | Freq: Three times a day (TID) | ORAL | 0 refills | Status: DC

## 2022-01-22 MED ORDER — METHOCARBAMOL 500 MG PO TABS: 500.0000 mg | ORAL_TABLET | Freq: Three times a day (TID) | ORAL | 0 refills | Status: DC

## 2022-01-22 MED ORDER — HYDROCODONE-ACETAMINOPHEN 5-325 MG PO TABS: ORAL_TABLET | ORAL | 0 refills | Status: DC

## 2022-01-22 NOTE — ED Triage Notes (Signed)
Pt injured right shoulder last week currently having intermittent pain.

## 2022-01-22 NOTE — Discharge Instructions (Signed)
Your symptoms are likely related to a muscle strain.  I recommend that you alternate ice and heat to the back of your shoulder.  Take the medication as directed.  Call the orthopedic provider listed to arrange a follow-up appointment for next week if your symptoms are not improving.  Avoid reaching pushing pulling or heavy lifting with the right arm for at least 1 week.

## 2022-01-25 NOTE — ED Provider Notes (Signed)
[facility] Provider Note   CSN: [id number] Arrival date & time: 01/22/22  1118     History  Chief Complaint  Patient presents with   Shoulder Injury    Carol [name] is a 56 y.o. female.   Shoulder Injury Pertinent negatives include no chest pain and no shortness of breath.       Carol [name] is a 56 y.o. female who presents to the Emergency Department complaining of right shoulder pain x1 week.  Patient describes lifting injury in which she felt pain of her right shoulder.  Pain is associated with movement and improves at rest.  Pain has been waxing and waning in duration.  Unrelieved with over-the-counter pain medications.  She denies any neck pain, numbness or tingling of her upper extremities.  Home Medications Prior to Admission medications   Medication Sig Start Date End Date Taking? Authorizing Provider  HYDROcodone-acetaminophen (NORCO/VICODIN) 5-325 MG tablet Take one tab po q 4 hrs prn pain 01/22/22  Yes Trayshawn Durkin, PA-C  ibuprofen (ADVIL) 800 MG tablet Take 1 tablet (800 mg total) by mouth 3 (three) times daily. Take with food 01/22/22  Yes Gerron Guidotti, PA-C  methocarbamol (ROBAXIN) 500 MG tablet Take 1 tablet (500 mg total) by mouth 3 (three) times daily. 01/22/22  Yes Kendrell Lottman, PA-C  acetaminophen (TYLENOL) 325 MG tablet Take 2 tablets (650 mg total) by mouth every 6 (six) hours as needed for up to 30 doses for moderate pain or mild pain. 06/11/21   Wyvonnia Dusky, MD  doxycycline (VIBRAMYCIN) 100 MG capsule Take 1 capsule (100 mg total) by mouth 2 (two) times daily. One po bid x 7 days 07/06/20   Mesner, Corene Cornea, MD  gabapentin (NEURONTIN) 100 MG capsule Take 1 capsule (100 mg total) by mouth 3 (three) times daily. 06/11/21 07/11/21  Wyvonnia Dusky, MD  methylPREDNISolone (MEDROL DOSEPAK) 4 MG TBPK tablet Take as directed on package 06/12/21   Wyvonnia Dusky, MD  metroNIDAZOLE (FLAGYL) 500 MG tablet Take 1 tablet (500 mg total)  by mouth 2 (two) times daily. One po bid x 7 days 07/06/20   Mesner, Corene Cornea, MD      Allergies    Naftin [naftifine hcl] and Latex    Review of Systems   Review of Systems  Constitutional:  Negative for chills and fever.  Respiratory:  Negative for chest tightness and shortness of breath.   Cardiovascular:  Negative for chest pain.  Gastrointestinal:  Negative for nausea and vomiting.  Musculoskeletal:  Positive for arthralgias (Right shoulder pain). Negative for back pain and neck pain.    Physical Exam Updated Vital Signs BP (!) 148/79 (BP Location: Right Arm)   Pulse 88   Temp 97.8 F (36.6 C) (Oral)   Resp 20   Ht 5' 7.5" (1.715 m)   Wt 79.4 kg   SpO2 100%   BMI 27.00 kg/m  Physical Exam Vitals and nursing note reviewed.  Constitutional:      Appearance: Normal appearance.  HENT:     Head: Atraumatic.  Cardiovascular:     Rate and Rhythm: Normal rate and regular rhythm.     Pulses: Normal pulses.  Pulmonary:     Effort: Pulmonary effort is normal.     Breath sounds: Normal breath sounds.  Chest:     Chest wall: No tenderness.  Musculoskeletal:        General: Tenderness and signs of injury present. Normal range of motion.  Cervical back: Normal range of motion. No tenderness.     Comments: Focal tenderness to palpation of the right scapular border.  No edema.  No rash noted.  Patient has full range of motion of the right shoulder joint without difficulty.  Grip strengths are strong and symmetrical.  Skin:    General: Skin is warm.     Capillary Refill: Capillary refill takes less than 2 seconds.     Findings: No rash.  Neurological:     General: No focal deficit present.     Mental Status: She is alert.     Sensory: No sensory deficit.     Motor: No weakness.     ED Results / Procedures / Treatments   Labs (all labs ordered are listed, but only abnormal results are displayed) Labs Reviewed - No data to display  EKG None  Radiology DG Shoulder  Right  Result Date: 01/22/2022 CLINICAL DATA:  Right shoulder pain for the past 2 weeks. EXAM: RIGHT SHOULDER - 2+ VIEW COMPARISON:  None Available. FINDINGS: No acute fracture or dislocation. Mild glenohumeral and acromioclavicular degenerative changes with joint space narrowing and small marginal osteophytes. Soft tissues are unremarkable. IMPRESSION: 1. Mild glenohumeral and acromioclavicular osteoarthritis. Electronically Signed   By: Titus Dubin M.D.   On: 01/22/2022 12:05     Procedures Procedures    Medications Ordered in ED Medications - No data to display  ED Course/ Medical Decision Making/ A&P                           Medical Decision Making Amount and/or Complexity of Data Reviewed Radiology: ordered.  Risk Prescription drug management.   Patient here for evaluation of shoulder injury that occurred 1 week ago.  Pain has been waxing and waning.  Reproduced with certain movements of the right arm.  She has full range of motion of the shoulder joint and the [facility] joint is nontender on exam.  X-ray of the right shoulder shows OA of the [facility] joint without acute bony injury.  I agree with radiology interpretation.  Patient symptoms likely musculoskeletal, doubt emergent process.  Patient agreeable with symptomatic treatment and outpatient follow-up in 1 week if needed.        Final Clinical Impression(s) / ED Diagnoses Final diagnoses:  Muscle strain    Rx / DC Orders ED Discharge Orders          Ordered    methocarbamol (ROBAXIN) 500 MG tablet  3 times daily        01/22/22 1234    ibuprofen (ADVIL) 800 MG tablet  3 times daily        01/22/22 1234    HYDROcodone-acetaminophen (NORCO/VICODIN) 5-325 MG tablet        01/22/22 1234              Kem Parkinson, PA-C 01/25/22 1441    Sherwood Gambler, MD 01/27/22 1721

## 2022-01-28 ENCOUNTER — Ambulatory Visit: Payer: 59 | Admitting: Orthopedic Surgery

## 2022-02-10 NOTE — H&P (View-Only) (Signed)
Patient ID: Carol [name], female    DOB: 07/25/1966, 56 y.o.   MRN: [id number]  Chief Complaint  Patient presents with   Pre-op Exam      ICD-10-CM   1. Lipoma of left axilla  D17.22        History of Present Illness: Carol [name] is a 56 y.o.  female  with a history of lipoma.  She presents for preoperative evaluation for upcoming procedure, excision of left axillary lipoma/mass, scheduled for 02/24/22 with Dr. Marla Roe.  The patient has not had problems with anesthesia.  Patient reports she has had surgery on her lower extremities before, and has not had issue with anesthesia.  Patient denies any personal or family history of breast cancer.  Patient denies any cardiac disease.  She denies taking any blood thinners.  She denies any birth control or hormone replacement.  She does report 1 miscarriage.  She denies any history of blood clot or family history of blood clot.  She denies any recent major surgeries, traumas, infections, strokes or heart attacks.  She denies any history of inflammatory bowel disease.  She denies COPD and asthma.  She reports she has bronchitis but does not take medications for.  She denies any history of cancer.  Patient does report she is a smoker.  She states that she did not have a cigarette today.   Summary of Previous Visit: Patient was seen for consult by Dr. Marla Roe on 12/02/2021.  At the visit, patient complained of mass to left axilla.  Plan for excision of lipoma, but patient needed updated mammogram.  Patient was then seen on 12/31/2021 with updated mammogram.  Mammogram showed no evidence of malignancy.  Patient still showed interest in moving forward with surgical excision of the lipoma.  Job: Does not work at the moment  [place] Significant for: Smoker, bronchitis   Patient reports she has a latex allergy   Past Medical History: Allergies: Allergies  Allergen Reactions   Naftin [Naftifine Hcl] Other (See Comments)    Peeling of skin,  discoloration   Latex Rash    Current Medications:  Current Outpatient Medications:    acetaminophen (TYLENOL) 325 MG tablet, Take 2 tablets (650 mg total) by mouth every 6 (six) hours as needed for up to 30 doses for moderate pain or mild pain., Disp: 30 tablet, Rfl: 0   doxycycline (VIBRAMYCIN) 100 MG capsule, Take 1 capsule (100 mg total) by mouth 2 (two) times daily. One po bid x 7 days, Disp: 14 capsule, Rfl: 0   HYDROcodone-acetaminophen (NORCO/VICODIN) 5-325 MG tablet, Take one tab po q 4 hrs prn pain, Disp: 8 tablet, Rfl: 0   ibuprofen (ADVIL) 800 MG tablet, Take 1 tablet (800 mg total) by mouth 3 (three) times daily. Take with food, Disp: 21 tablet, Rfl: 0   methocarbamol (ROBAXIN) 500 MG tablet, Take 1 tablet (500 mg total) by mouth 3 (three) times daily., Disp: 21 tablet, Rfl: 0   methylPREDNISolone (MEDROL DOSEPAK) 4 MG TBPK tablet, Take as directed on package, Disp: 21 tablet, Rfl: 0   metroNIDAZOLE (FLAGYL) 500 MG tablet, Take 1 tablet (500 mg total) by mouth 2 (two) times daily. One po bid x 7 days, Disp: 14 tablet, Rfl: 0   gabapentin (NEURONTIN) 100 MG capsule, Take 1 capsule (100 mg total) by mouth 3 (three) times daily., Disp: 30 capsule, Rfl: 0  Past Medical Problems: Past Medical History:  Diagnosis Date   Back pain  Bronchitis    Cervicalgia    History of blood in urine    microscopic   History of migraine headaches    Impaired fasting glucose     Past Surgical History: Past Surgical History:  Procedure Laterality Date   ABDOMINAL HYSTERECTOMY  2002   CHOLECYSTECTOMY     TOE ARTHROPLASTY Bilateral 05/16/2020   Procedure: DEROTATIONAL ARTHROPLASTY OF FIFTH DIGIT RIGHT FOOT,DEROTATIONAL ARTHROPLASTY OF FIFTH DIGIT LEFT FOOT,EXOSTECTOMY OF FIFTH DIGIT RIGHT FOOT AND LEFT FOOT;  Surgeon: Caprice Beaver, DPM;  Location: [facility];  Service: Podiatry;  Laterality: Bilateral;   TUBAL LIGATION      Social History: Social History   Socioeconomic History    Marital status: Single    Spouse name: Not on file   Number of children: 2   Years of education: Not on file   Highest education level: Not on file  Occupational History   Occupation: supervisor    Employer: [employer]  Tobacco Use   Smoking status: Every Day    Packs/day: 1.00    Years: 20.00    Total pack years: 20.00    Types: Cigarettes, Cigars   Smokeless tobacco: Never   Tobacco comments:    Tobacco info given  Vaping Use   Vaping Use: Never used  Substance and Sexual Activity   Alcohol use: Yes    Comment: occ.   Drug use: No   Sexual activity: Yes  Other Topics Concern   Not on file  Social History Narrative   Not on file   Social Determinants of Health   Financial Resource Strain: Not on file  Food Insecurity: Not on file  Transportation Needs: Not on file  Physical Activity: Not on file  Stress: Not on file  Social Connections: Not on file  Intimate Partner Violence: Not on file    Family History: Family History  Problem Relation Age of Onset   Arthritis Paternal Grandmother    Diabetes Paternal Grandmother    Hypertension Paternal Grandmother    Stroke Paternal Grandmother    Diabetes Mother    Colon cancer Neg Hx     Review of Systems: Patient reports she recently was seen in the emergency room for strained muscle of the right shoulder. She states she was prescribed short-term pain medications from the emergency room. Denies any other changes in her interim health.  Physical Exam: Vital Signs BP (!) 151/82 (BP Location: Left Arm, Patient Position: Sitting, Cuff Size: Small)   Pulse 95   Ht '5\' 7"'$  (1.702 m)   Wt 149 lb 6.4 oz (67.8 kg)   SpO2 97%   BMI 23.40 kg/m   Physical Exam  Constitutional:      General: Not in acute distress.    Appearance: Normal appearance. Not ill-appearing.  HENT:     Head: Normocephalic and atraumatic.  Eyes:     Pupils: Pupils are equal, round Neck:     Musculoskeletal: Normal range of motion.   Cardiovascular:     Rate and Rhythm: Normal rate Pulmonary:     Effort: Pulmonary effort is normal. No respiratory distress.  Abdominal:     General: Abdomen is flat. There is no distension.  Musculoskeletal: Normal range of motion.  Lower Extremities: No varicose veins noted, no swelling Skin:    General: Skin is warm and dry.     Findings: No erythema or rash.  Neurological:     Mental Status: Alert and oriented to person, place, and time. Mental status is  at baseline.  Psychiatric:        Mood and Affect: Mood normal.        Behavior: Behavior normal.    Assessment/Plan: The patient is scheduled for excision of left axillary lipoma/mass  with Dr. Marla Roe.  Risks, benefits, and alternatives of procedure discussed, questions answered and consent obtained.    Smoking Status: Smoker ; Counseling Given?  I discussed with patient that smoking puts patient at a higher risk for postsurgical complications.  Instructed patient to stop smoking today.  Patient acknowledged and states she will not smoke before surgery.   Last Mammogram: 12/24/2021; Results: No mammographic evidence of malignancy in either breast, submuscular lipoma along the left lateral thorax, BI-RADS Category 2  Caprini Score: 4; Risk Factors include: Smoking status, age, and length of planned surgery. Recommendation for mechanical prophylaxis. Encourage early ambulation.   Pictures obtained: 12/02/21  Post-op Rx sent to pharmacy: Oxycodone, Zofran, Keflex  Discussed with patient to hold ibuprofen, any multivitamins, any herbal teas 1 to 2 weeks prior to surgery.  Patient was provided with the General Surgical Risk consent document and Pain Medication Agreement prior to their appointment.  They had adequate time to read through the risk consent documents and Pain Medication Agreement. We also discussed them in person together during this preop appointment. All of their questions were answered to their satisfaction.   Recommended calling if they have any further questions.  Risk consent form and Pain Medication Agreement to be scanned into patient's chart.    Electronically signed by: Clance Boll, PA-C 02/11/2022 5:21 PM

## 2022-02-10 NOTE — Progress Notes (Signed)
Patient ID: Carol [name], female    DOB: 09-09-1965, 56 y.o.   MRN: [id number]  Chief Complaint  Patient presents with   Pre-op Exam      ICD-10-CM   1. Lipoma of left axilla  D17.22        History of Present Illness: Carol [name] is a 56 y.o.  female  with a history of lipoma.  She presents for preoperative evaluation for upcoming procedure, excision of left axillary lipoma/mass, scheduled for 02/24/22 with Dr. Marla Roe.  The patient has not had problems with anesthesia.  Patient reports she has had surgery on her lower extremities before, and has not had issue with anesthesia.  Patient denies any personal or family history of breast cancer.  Patient denies any cardiac disease.  She denies taking any blood thinners.  She denies any birth control or hormone replacement.  She does report 1 miscarriage.  She denies any history of blood clot or family history of blood clot.  She denies any recent major surgeries, traumas, infections, strokes or heart attacks.  She denies any history of inflammatory bowel disease.  She denies COPD and asthma.  She reports she has bronchitis but does not take medications for.  She denies any history of cancer.  Patient does report she is a smoker.  She states that she did not have a cigarette today.   Summary of Previous Visit: Patient was seen for consult by Dr. Marla Roe on 12/02/2021.  At the visit, patient complained of mass to left axilla.  Plan for excision of lipoma, but patient needed updated mammogram.  Patient was then seen on 12/31/2021 with updated mammogram.  Mammogram showed no evidence of malignancy.  Patient still showed interest in moving forward with surgical excision of the lipoma.  Job: Does not work at the moment  [place] Significant for: Smoker, bronchitis   Patient reports she has a latex allergy   Past Medical History: Allergies: Allergies  Allergen Reactions   Naftin [Naftifine Hcl] Other (See Comments)    Peeling of skin,  discoloration   Latex Rash    Current Medications:  Current Outpatient Medications:    acetaminophen (TYLENOL) 325 MG tablet, Take 2 tablets (650 mg total) by mouth every 6 (six) hours as needed for up to 30 doses for moderate pain or mild pain., Disp: 30 tablet, Rfl: 0   doxycycline (VIBRAMYCIN) 100 MG capsule, Take 1 capsule (100 mg total) by mouth 2 (two) times daily. One po bid x 7 days, Disp: 14 capsule, Rfl: 0   HYDROcodone-acetaminophen (NORCO/VICODIN) 5-325 MG tablet, Take one tab po q 4 hrs prn pain, Disp: 8 tablet, Rfl: 0   ibuprofen (ADVIL) 800 MG tablet, Take 1 tablet (800 mg total) by mouth 3 (three) times daily. Take with food, Disp: 21 tablet, Rfl: 0   methocarbamol (ROBAXIN) 500 MG tablet, Take 1 tablet (500 mg total) by mouth 3 (three) times daily., Disp: 21 tablet, Rfl: 0   methylPREDNISolone (MEDROL DOSEPAK) 4 MG TBPK tablet, Take as directed on package, Disp: 21 tablet, Rfl: 0   metroNIDAZOLE (FLAGYL) 500 MG tablet, Take 1 tablet (500 mg total) by mouth 2 (two) times daily. One po bid x 7 days, Disp: 14 tablet, Rfl: 0   gabapentin (NEURONTIN) 100 MG capsule, Take 1 capsule (100 mg total) by mouth 3 (three) times daily., Disp: 30 capsule, Rfl: 0  Past Medical Problems: Past Medical History:  Diagnosis Date   Back pain  Bronchitis    Cervicalgia    History of blood in urine    microscopic   History of migraine headaches    Impaired fasting glucose     Past Surgical History: Past Surgical History:  Procedure Laterality Date   ABDOMINAL HYSTERECTOMY  2002   CHOLECYSTECTOMY     TOE ARTHROPLASTY Bilateral 05/16/2020   Procedure: DEROTATIONAL ARTHROPLASTY OF FIFTH DIGIT RIGHT FOOT,DEROTATIONAL ARTHROPLASTY OF FIFTH DIGIT LEFT FOOT,EXOSTECTOMY OF FIFTH DIGIT RIGHT FOOT AND LEFT FOOT;  Surgeon: Caprice Beaver, DPM;  Location: [facility];  Service: Podiatry;  Laterality: Bilateral;   TUBAL LIGATION      Social History: Social History   Socioeconomic History    Marital status: Single    Spouse name: Not on file   Number of children: 2   Years of education: Not on file   Highest education level: Not on file  Occupational History   Occupation: supervisor    Employer: [employer]  Tobacco Use   Smoking status: Every Day    Packs/day: 1.00    Years: 20.00    Total pack years: 20.00    Types: Cigarettes, Cigars   Smokeless tobacco: Never   Tobacco comments:    Tobacco info given  Vaping Use   Vaping Use: Never used  Substance and Sexual Activity   Alcohol use: Yes    Comment: occ.   Drug use: No   Sexual activity: Yes  Other Topics Concern   Not on file  Social History Narrative   Not on file   Social Determinants of Health   Financial Resource Strain: Not on file  Food Insecurity: Not on file  Transportation Needs: Not on file  Physical Activity: Not on file  Stress: Not on file  Social Connections: Not on file  Intimate Partner Violence: Not on file    Family History: Family History  Problem Relation Age of Onset   Arthritis Paternal Grandmother    Diabetes Paternal Grandmother    Hypertension Paternal Grandmother    Stroke Paternal Grandmother    Diabetes Mother    Colon cancer Neg Hx     Review of Systems: Patient reports she recently was seen in the emergency room for strained muscle of the right shoulder. She states she was prescribed short-term pain medications from the emergency room. Denies any other changes in her interim health.  Physical Exam: Vital Signs BP (!) 151/82 (BP Location: Left Arm, Patient Position: Sitting, Cuff Size: Small)   Pulse 95   Ht '5\' 7"'$  (1.702 m)   Wt 149 lb 6.4 oz (67.8 kg)   SpO2 97%   BMI 23.40 kg/m   Physical Exam  Constitutional:      General: Not in acute distress.    Appearance: Normal appearance. Not ill-appearing.  HENT:     Head: Normocephalic and atraumatic.  Eyes:     Pupils: Pupils are equal, round Neck:     Musculoskeletal: Normal range of motion.   Cardiovascular:     Rate and Rhythm: Normal rate Pulmonary:     Effort: Pulmonary effort is normal. No respiratory distress.  Abdominal:     General: Abdomen is flat. There is no distension.  Musculoskeletal: Normal range of motion.  Lower Extremities: No varicose veins noted, no swelling Skin:    General: Skin is warm and dry.     Findings: No erythema or rash.  Neurological:     Mental Status: Alert and oriented to person, place, and time. Mental status is  at baseline.  Psychiatric:        Mood and Affect: Mood normal.        Behavior: Behavior normal.    Assessment/Plan: The patient is scheduled for excision of left axillary lipoma/mass  with Dr. Marla Roe.  Risks, benefits, and alternatives of procedure discussed, questions answered and consent obtained.    Smoking Status: Smoker ; Counseling Given?  I discussed with patient that smoking puts patient at a higher risk for postsurgical complications.  Instructed patient to stop smoking today.  Patient acknowledged and states she will not smoke before surgery.   Last Mammogram: 12/24/2021; Results: No mammographic evidence of malignancy in either breast, submuscular lipoma along the left lateral thorax, BI-RADS Category 2  Caprini Score: 4; Risk Factors include: Smoking status, age, and length of planned surgery. Recommendation for mechanical prophylaxis. Encourage early ambulation.   Pictures obtained: 12/02/21  Post-op Rx sent to pharmacy: Oxycodone, Zofran, Keflex  Discussed with patient to hold ibuprofen, any multivitamins, any herbal teas 1 to 2 weeks prior to surgery.  Patient was provided with the General Surgical Risk consent document and Pain Medication Agreement prior to their appointment.  They had adequate time to read through the risk consent documents and Pain Medication Agreement. We also discussed them in person together during this preop appointment. All of their questions were answered to their satisfaction.   Recommended calling if they have any further questions.  Risk consent form and Pain Medication Agreement to be scanned into patient's chart.    Electronically signed by: Clance Boll, PA-C 02/11/2022 5:21 PM

## 2022-02-11 ENCOUNTER — Encounter (HOSPITAL_BASED_OUTPATIENT_CLINIC_OR_DEPARTMENT_OTHER): Payer: Self-pay | Admitting: Plastic Surgery

## 2022-02-11 ENCOUNTER — Other Ambulatory Visit: Payer: Self-pay

## 2022-02-11 ENCOUNTER — Ambulatory Visit (INDEPENDENT_AMBULATORY_CARE_PROVIDER_SITE_OTHER): Payer: 59 | Admitting: Student

## 2022-02-11 VITALS — BP 151/82 | HR 95 | Ht 67.0 in | Wt 149.4 lb

## 2022-02-11 DIAGNOSIS — D1722 Benign lipomatous neoplasm of skin and subcutaneous tissue of left arm: Secondary | ICD-10-CM

## 2022-02-12 MED ORDER — CEPHALEXIN 500 MG PO CAPS: 500.0000 mg | ORAL_CAPSULE | Freq: Four times a day (QID) | ORAL | 0 refills | Status: AC

## 2022-02-12 MED ORDER — OXYCODONE HCL 5 MG PO TABS: 5.0000 mg | ORAL_TABLET | Freq: Three times a day (TID) | ORAL | 0 refills | Status: DC | PRN

## 2022-02-12 MED ORDER — ONDANSETRON HCL 4 MG PO TABS: 4.0000 mg | ORAL_TABLET | Freq: Three times a day (TID) | ORAL | 0 refills | Status: DC | PRN

## 2022-02-23 NOTE — Anesthesia Preprocedure Evaluation (Addendum)
Anesthesia Evaluation  Patient identified by MRN, date of birth, ID band Patient awake    Reviewed: Allergy & Precautions, NPO status , Patient's Chart, lab work & pertinent test results  History of Anesthesia Complications Negative for: history of anesthetic complications  Airway Mallampati: I  TM Distance: >3 FB Neck ROM: Full    Dental  (+) Teeth Intact, Dental Advisory Given   Pulmonary Current Smoker and Patient abstained from smoking.,    breath sounds clear to auscultation       Cardiovascular negative cardio ROS   Rhythm:Regular Rate:Normal     Neuro/Psych Chronic back pain negative neurological ROS     GI/Hepatic negative GI ROS, Neg liver ROS,   Endo/Other  negative endocrine ROS  Renal/GU negative Renal ROS     Musculoskeletal   Abdominal   Peds  Hematology negative hematology ROS (+)   Anesthesia Other Findings   Reproductive/Obstetrics                            Anesthesia Physical Anesthesia Plan  ASA: 2  Anesthesia Plan: General   Post-op Pain Management: Tylenol PO (pre-op)*   Induction: Intravenous  PONV Risk Score and Plan: 2 and Ondansetron and Dexamethasone  Airway Management Planned: LMA  Additional Equipment: None  Intra-op Plan:   Post-operative Plan:   Informed Consent: I have reviewed the patients History and Physical, chart, labs and discussed the procedure including the risks, benefits and alternatives for the proposed anesthesia with the patient or authorized representative who has indicated his/her understanding and acceptance.     Dental advisory given  Plan Discussed with: CRNA and Surgeon  Anesthesia Plan Comments:        Anesthesia Quick Evaluation

## 2022-02-24 ENCOUNTER — Other Ambulatory Visit: Payer: Self-pay

## 2022-02-24 ENCOUNTER — Ambulatory Visit (HOSPITAL_BASED_OUTPATIENT_CLINIC_OR_DEPARTMENT_OTHER): Payer: 59 | Admitting: Certified Registered"

## 2022-02-24 ENCOUNTER — Encounter (HOSPITAL_BASED_OUTPATIENT_CLINIC_OR_DEPARTMENT_OTHER): Payer: Self-pay | Admitting: Plastic Surgery

## 2022-02-24 ENCOUNTER — Encounter (HOSPITAL_BASED_OUTPATIENT_CLINIC_OR_DEPARTMENT_OTHER)
Admission: RE | Disposition: A | Discharge status: Home / Self Care | Payer: Self-pay | Source: Home / Self Care | Attending: Plastic Surgery

## 2022-02-24 ENCOUNTER — Ambulatory Visit (HOSPITAL_BASED_OUTPATIENT_CLINIC_OR_DEPARTMENT_OTHER)
Admission: RE | Admit: 2022-02-24 | Discharge: 2022-02-24 | Disposition: A | Discharge status: Home / Self Care | Payer: 59 | Attending: Plastic Surgery | Admitting: Plastic Surgery

## 2022-02-24 DIAGNOSIS — R222 Localized swelling, mass and lump, trunk: Secondary | ICD-10-CM | POA: Diagnosis present

## 2022-02-24 DIAGNOSIS — F17200 Nicotine dependence, unspecified, uncomplicated: Secondary | ICD-10-CM | POA: Insufficient documentation

## 2022-02-24 DIAGNOSIS — R2232 Localized swelling, mass and lump, left upper limb: Secondary | ICD-10-CM

## 2022-02-24 DIAGNOSIS — D171 Benign lipomatous neoplasm of skin and subcutaneous tissue of trunk: Secondary | ICD-10-CM | POA: Diagnosis not present

## 2022-02-24 DIAGNOSIS — Z01818 Encounter for other preprocedural examination: Secondary | ICD-10-CM

## 2022-02-24 DIAGNOSIS — M549 Dorsalgia, unspecified: Secondary | ICD-10-CM | POA: Diagnosis not present

## 2022-02-24 DIAGNOSIS — G8929 Other chronic pain: Secondary | ICD-10-CM | POA: Insufficient documentation

## 2022-02-24 HISTORY — PX: LIPOSUCTION: SHX10

## 2022-02-24 HISTORY — PX: LIPOMA EXCISION: SHX5283

## 2022-02-24 HISTORY — DX: Nicotine dependence, unspecified, uncomplicated: F17.200

## 2022-02-24 SURGERY — EXCISION LIPOMA
Anesthesia: General | Site: Back | Laterality: Left

## 2022-02-24 MED ORDER — OXYCODONE HCL 5 MG PO TABS
5.0000 mg | ORAL_TABLET | Freq: Once | ORAL | Status: AC | PRN
  Administered 2022-02-24: 5 mg via ORAL

## 2022-02-24 MED ORDER — CHLORHEXIDINE GLUCONATE CLOTH 2 % EX PADS: 6.0000 | MEDICATED_PAD | Freq: Once | CUTANEOUS | Status: DC

## 2022-02-24 MED ORDER — ACETAMINOPHEN 500 MG PO TABS
ORAL_TABLET | ORAL | Status: AC
  Filled 2022-02-24: qty 1

## 2022-02-24 MED ORDER — KETOROLAC TROMETHAMINE 30 MG/ML IJ SOLN
INTRAMUSCULAR | Status: AC
  Filled 2022-02-24: qty 1

## 2022-02-24 MED ORDER — PROPOFOL 10 MG/ML IV BOLUS
INTRAVENOUS | Status: DC | PRN
  Administered 2022-02-24: 200 mg via INTRAVENOUS

## 2022-02-24 MED ORDER — FENTANYL CITRATE (PF) 100 MCG/2ML IJ SOLN
25.0000 ug | INTRAMUSCULAR | Status: DC | PRN
  Administered 2022-02-24 (×3): 50 ug via INTRAVENOUS

## 2022-02-24 MED ORDER — LIDOCAINE 2% (20 MG/ML) 5 ML SYRINGE
INTRAMUSCULAR | Status: DC | PRN
  Administered 2022-02-24: 60 mg via INTRAVENOUS

## 2022-02-24 MED ORDER — MIDAZOLAM HCL 2 MG/2ML IJ SOLN: 0.5000 mg | Freq: Once | INTRAMUSCULAR | Status: DC | PRN

## 2022-02-24 MED ORDER — LIDOCAINE-EPINEPHRINE 1 %-1:100000 IJ SOLN
INTRAMUSCULAR | Status: DC | PRN
  Administered 2022-02-24: 50 mL

## 2022-02-24 MED ORDER — MIDAZOLAM HCL 2 MG/2ML IJ SOLN
INTRAMUSCULAR | Status: AC
  Filled 2022-02-24: qty 2

## 2022-02-24 MED ORDER — PROPOFOL 500 MG/50ML IV EMUL
INTRAVENOUS | Status: DC | PRN
  Administered 2022-02-24: 25 ug/kg/min via INTRAVENOUS

## 2022-02-24 MED ORDER — MIDAZOLAM HCL 5 MG/5ML IJ SOLN
INTRAMUSCULAR | Status: DC | PRN
  Administered 2022-02-24: 2 mg via INTRAVENOUS

## 2022-02-24 MED ORDER — PROMETHAZINE HCL 25 MG/ML IJ SOLN: 6.2500 mg | INTRAMUSCULAR | Status: DC | PRN

## 2022-02-24 MED ORDER — OXYCODONE HCL 5 MG/5ML PO SOLN: 5.0000 mg | Freq: Once | ORAL | Status: AC | PRN

## 2022-02-24 MED ORDER — PHENYLEPHRINE HCL (PRESSORS) 10 MG/ML IV SOLN
INTRAVENOUS | Status: DC | PRN
  Administered 2022-02-24 (×3): 40 ug via INTRAVENOUS

## 2022-02-24 MED ORDER — FENTANYL CITRATE (PF) 100 MCG/2ML IJ SOLN
INTRAMUSCULAR | Status: AC
  Filled 2022-02-24: qty 2

## 2022-02-24 MED ORDER — CEFAZOLIN SODIUM-DEXTROSE 2-4 GM/100ML-% IV SOLN
2.0000 g | INTRAVENOUS | Status: AC
  Administered 2022-02-24: 2 g via INTRAVENOUS

## 2022-02-24 MED ORDER — CEFAZOLIN SODIUM-DEXTROSE 2-4 GM/100ML-% IV SOLN
INTRAVENOUS | Status: AC
  Filled 2022-02-24: qty 100

## 2022-02-24 MED ORDER — BUPIVACAINE-EPINEPHRINE (PF) 0.25% -1:200000 IJ SOLN
INTRAMUSCULAR | Status: AC
  Filled 2022-02-24: qty 30

## 2022-02-24 MED ORDER — MEPERIDINE HCL 25 MG/ML IJ SOLN: 6.2500 mg | INTRAMUSCULAR | Status: DC | PRN

## 2022-02-24 MED ORDER — DEXAMETHASONE SODIUM PHOSPHATE 4 MG/ML IJ SOLN
INTRAMUSCULAR | Status: DC | PRN
  Administered 2022-02-24: 4 mg via INTRAVENOUS

## 2022-02-24 MED ORDER — ONDANSETRON HCL 4 MG/2ML IJ SOLN
INTRAMUSCULAR | Status: DC | PRN
  Administered 2022-02-24: 4 mg via INTRAVENOUS

## 2022-02-24 MED ORDER — ACETAMINOPHEN 500 MG PO TABS
1000.0000 mg | ORAL_TABLET | Freq: Once | ORAL | Status: AC
  Administered 2022-02-24: 1000 mg via ORAL

## 2022-02-24 MED ORDER — KETOROLAC TROMETHAMINE 30 MG/ML IJ SOLN
30.0000 mg | Freq: Once | INTRAMUSCULAR | Status: AC
  Administered 2022-02-24: 30 mg via INTRAVENOUS

## 2022-02-24 MED ORDER — OXYCODONE HCL 5 MG PO TABS
ORAL_TABLET | ORAL | Status: AC
  Filled 2022-02-24: qty 1

## 2022-02-24 MED ORDER — SODIUM CHLORIDE 0.9% FLUSH: 3.0000 mL | Freq: Two times a day (BID) | INTRAVENOUS | Status: DC

## 2022-02-24 MED ORDER — LACTATED RINGERS IV SOLN: INTRAVENOUS | Status: DC

## 2022-02-24 MED ORDER — 0.9 % SODIUM CHLORIDE (POUR BTL) OPTIME
TOPICAL | Status: DC | PRN
  Administered 2022-02-24: 1000 mL

## 2022-02-24 MED ORDER — FENTANYL CITRATE (PF) 100 MCG/2ML IJ SOLN
INTRAMUSCULAR | Status: DC | PRN
  Administered 2022-02-24 (×3): 50 ug via INTRAVENOUS

## 2022-02-24 SURGICAL SUPPLY — 44 items
ADH SKN CLS APL DERMABOND .7 (GAUZE/BANDAGES/DRESSINGS) ×1
APL PRP STRL LF DISP 70% ISPRP (MISCELLANEOUS) ×1
BINDER BREAST XLRG (GAUZE/BANDAGES/DRESSINGS) ×1 IMPLANT
BLADE SURG 15 STRL LF DISP TIS (BLADE) ×2 IMPLANT
BLADE SURG 15 STRL SS (BLADE) ×2
CANISTER SUCT 1200ML W/VALVE (MISCELLANEOUS) ×1 IMPLANT
CHLORAPREP W/TINT 26 (MISCELLANEOUS) ×1 IMPLANT
COVER BACK TABLE 60X90IN (DRAPES) ×3 IMPLANT
COVER MAYO STAND STRL (DRAPES) ×3 IMPLANT
DERMABOND ADVANCED (GAUZE/BANDAGES/DRESSINGS) ×1
DERMABOND ADVANCED .7 DNX12 (GAUZE/BANDAGES/DRESSINGS) IMPLANT
DRAPE LAPAROTOMY 100X72 PEDS (DRAPES) ×3 IMPLANT
DRESSING MEPILEX FLEX 4X4 (GAUZE/BANDAGES/DRESSINGS) IMPLANT
DRSG MEPILEX FLEX 4X4 (GAUZE/BANDAGES/DRESSINGS) ×2
ELECT COATED BLADE 2.86 ST (ELECTRODE) ×1 IMPLANT
ELECT REM PT RETURN 9FT ADLT (ELECTROSURGICAL) ×2
ELECTRODE REM PT RTRN 9FT ADLT (ELECTROSURGICAL) ×2 IMPLANT
GLOVE BIOGEL PI IND STRL 7.0 (GLOVE) ×2 IMPLANT
GLOVE BIOGEL PI INDICATOR 7.0 (GLOVE) ×2
GLOVE SURG SS PI 6.5 STRL IVOR (GLOVE) ×2 IMPLANT
GLOVE SURG SS PI 7.0 STRL IVOR (GLOVE) ×1 IMPLANT
GOWN STRL REUS W/ TWL LRG LVL3 (GOWN DISPOSABLE) ×4 IMPLANT
GOWN STRL REUS W/TWL LRG LVL3 (GOWN DISPOSABLE) ×4
NDL HYPO 25X1 1.5 SAFETY (NEEDLE) ×2 IMPLANT
NEEDLE HYPO 25X1 1.5 SAFETY (NEEDLE) ×2 IMPLANT
NS IRRIG 1000ML POUR BTL (IV SOLUTION) ×1 IMPLANT
PACK BASIN DAY SURGERY FS (CUSTOM PROCEDURE TRAY) ×3 IMPLANT
PAD FOAM SILICONE BACKED (GAUZE/BANDAGES/DRESSINGS) ×1 IMPLANT
PENCIL SMOKE EVACUATOR (MISCELLANEOUS) ×3 IMPLANT
SPONGE GAUZE 2X2 8PLY STRL LF (GAUZE/BANDAGES/DRESSINGS) ×1 IMPLANT
SPONGE T-LAP 18X18 ~~LOC~~+RFID (SPONGE) ×1 IMPLANT
STRIP CLOSURE SKIN 1/2X4 (GAUZE/BANDAGES/DRESSINGS) ×1 IMPLANT
SUT ETHILON 4 0 PS 2 18 (SUTURE) IMPLANT
SUT MNCRL AB 4-0 PS2 18 (SUTURE) ×1 IMPLANT
SUT MON AB 3-0 SH 27 (SUTURE) ×2
SUT MON AB 3-0 SH27 (SUTURE) IMPLANT
SUT MON AB 5-0 PS2 18 (SUTURE) ×2 IMPLANT
SYR BULB EAR ULCER 3OZ GRN STR (SYRINGE) ×3 IMPLANT
SYR CONTROL 10ML LL (SYRINGE) ×3 IMPLANT
TOWEL GREEN STERILE FF (TOWEL DISPOSABLE) ×3 IMPLANT
TRAY DSU PREP LF (CUSTOM PROCEDURE TRAY) ×3 IMPLANT
TUBE CONNECTING 20X1/4 (TUBING) ×3 IMPLANT
TUBING SET GRADUATE ASPIR 12FT (MISCELLANEOUS) ×1 IMPLANT
YANKAUER SUCT BULB TIP NO VENT (SUCTIONS) ×1 IMPLANT

## 2022-02-24 NOTE — Interval H&P Note (Signed)
History and Physical Interval Note:  02/24/2022 11:38 AM  Carol [name]  has presented today for surgery, with the diagnosis of Mass of Left Axilla.  The various methods of treatment have been discussed with the patient and family. After consideration of risks, benefits and other options for treatment, the patient has consented to  Procedure(s) with comments: EXCISION LIPOMA/MASS LEFT AXILLARY (Left) - 45 minutes as a surgical intervention.  The patient's history has been reviewed, patient examined, no change in status, stable for surgery.  I have reviewed the patient's chart and labs.  Questions were answered to the patient's satisfaction.     Loel Lofty Tamika Nou

## 2022-02-24 NOTE — Anesthesia Procedure Notes (Signed)
Procedure Name: LMA Insertion Date/Time: 02/24/2022 11:51 AM  Performed by: Theodor Mustin, Ernesta Amble, CRNAPre-anesthesia Checklist: Patient identified, Emergency Drugs available, Suction available and Patient being monitored Patient Re-evaluated:Patient Re-evaluated prior to induction Oxygen Delivery Method: Circle system utilized Preoxygenation: Pre-oxygenation with 100% oxygen Induction Type: IV induction Ventilation: Mask ventilation without difficulty LMA: LMA inserted LMA Size: 4.0 Number of attempts: 1 Airway Equipment and Method: Bite block Placement Confirmation: positive ETCO2 Tube secured with: Tape Dental Injury: Teeth and Oropharynx as per pre-operative assessment

## 2022-02-24 NOTE — Transfer of Care (Signed)
Immediate Anesthesia Transfer of Care Note  Patient: Carol [name]  Procedure(s) Performed: EXCISION LIPOMA/MASS LEFT AXILLARY (Left: Back) LIPOSUCTION (Left: Back)  Patient Location: PACU  Anesthesia Type:General  Level of Consciousness: drowsy and patient cooperative  Airway & Oxygen Therapy: Patient Spontanous Breathing and Patient connected to face mask oxygen  Post-op Assessment: Report given to RN and Post -op Vital signs reviewed and stable  Post vital signs: Reviewed and stable  Last Vitals:  Vitals Value Taken Time  BP    Temp    Pulse 97 02/24/22 1307  Resp    SpO2 98 % 02/24/22 1307  Vitals shown include unvalidated device data.  Last Pain:  Vitals:   02/24/22 0936  TempSrc: Oral  PainSc: 0-No pain         Complications: No notable events documented.

## 2022-02-24 NOTE — Discharge Instructions (Addendum)
INSTRUCTIONS FOR AFTER BREAST SURGERY   You will likely have some questions about what to expect following your operation.  The following information will help you and your family understand what to expect when you are discharged from the hospital.  Following these guidelines will help ensure a smooth recovery and reduce risks of complications.  Postoperative instructions include information on: diet, wound care, medications and physical activity.  AFTER SURGERY Expect to go home after the procedure.  In some cases, you may need to spend one night in the hospital for observation.  DIET Breast surgery does not require a specific diet.  However, the healthier you eat the better your body can start healing. It is important to increasing your protein intake.  This means limiting the foods with sugar and carbohydrates.  Focus on vegetables and some meat.  If you have any liposuction during your procedure be sure to drink water.  If your urine is bright yellow, then it is concentrated, and you need to drink more water.  As a general rule after surgery, you should have 8 ounces of water every hour while awake.  If you find you are persistently nauseated or unable to take in liquids let us know.  NO TOBACCO USE or EXPOSURE.  This will slow your healing process and increase the risk of a wound.  WOUND CARE Leave the binder on for 1 days . Use fragrance free soap.   After 1 days you can remove the binder to shower. Once dry apply binder or sports bra.  Use a mild soap like Dial, Dove and [place]. You may have Topifoam or Lipofoam on.  It is soft and spongy and helps keep you from getting creases if you have liposuction.  This can be removed before the shower and then replaced.  If you need more it is available on [employer] (Lipofoam). If you have steri-strips / tape directly attached to your skin leave them in place. It is OK to get these wet.   No baths, pools or hot tubs for four weeks. We close your incision to  leave the smallest and best-looking scar. No ointment or creams on your incisions until given the go ahead.  Especially not Neosporin (Too many skin reactions with this one).  A few weeks after surgery you can use Mederma and start massaging the scar. We ask you to wear your binder or sports bra for the first 6 weeks around the clock, including while sleeping. This provides added comfort and helps reduce the fluid accumulation at the surgery site.  ACTIVITY No heavy lifting until cleared by the doctor.  This usually means no more than a half-gallon of milk.  It is OK to walk and climb stairs. In fact, moving your legs is very important to decrease your risk of a blood clot.  It will also help keep you from getting deconditioned.  Every 1 to 2 hours get up and walk for 5 minutes. This will help with a quicker recovery back to normal.  Let pain be your guide so you don't do too much.  This is not the time for spring cleaning and don't plan on taking care of anyone else.  This time is for you to recover,  You will be more comfortable if you sleep and rest with your head elevated either with a few pillows under you or in a recliner.  No stomach sleeping for a three months.  WORK Everyone returns to work at different times. As a  rough guide, most people take at least 1 - 2 weeks off prior to returning to work. If you need documentation for your job, bring the forms to your postoperative follow up visit.  DRIVING Arrange for someone to bring you home from the hospital.  You may be able to drive a few days after surgery but not while taking any narcotics or valium.  BOWEL MOVEMENTS Constipation can occur after anesthesia and while taking pain medication.  It is important to stay ahead for your comfort.  We recommend taking Milk of Magnesia (2 tablespoons; twice a day) while taking the pain pills.  MEDICATIONS You may be prescribed should start after surgery At your preoperative visit for you history and  physical you may have been given the following medications: An antibiotic: Start this medication when you get home and take according to the instructions on the bottle. Zofran 4 mg:  This is to treat nausea and vomiting.  You can take this every 6 hours as needed and only if needed. Valium 2 mg: This is for muscle tightness if you have an implant or expander. This will help relax your muscle which also helps with pain control.  This can be taken every 12 hours as needed. Don't drive after taking this medication. Norco (hydrocodone/acetaminophen) 5/325 mg:  This is only to be used after you have taken the motrin or the tylenol. Every 8 hours as needed.   Over the counter Medication to take: Ibuprofen (Motrin) 600 mg:  Take this every 6 hours.  If you have additional pain then take 500 mg of the tylenol every 8 hours.  Only take the Norco after you have tried these two. Miralax or stool softener of choice: Take this according to the bottle if you take the [place] Call your surgeon's office if any of the following occur: Fever 101 degrees F or greater Excessive bleeding or fluid from the incision site. Pain that increases over time without aid from the medications Redness, warmth, or pus draining from incision sites Persistent nausea or inability to take in liquids Severe misshapen area that underwent the operation.   Post Anesthesia Home Care Instructions Activity: Get plenty of rest for the remainder of the day. A responsible individual must stay with you for 24 hours following the procedure.   For the next 24 hours, DO NOT: -Drive a Retail banker Drink alcoholic beverages -Take any medication unless instructed by your physician -Make any legal decisions or sign important papers.  Meals: Start with liquid foods such as gelatin or soup. Progress to regular foods as tolerated. Avoid greasy, spicy, heavy foods. If nausea and/or vomiting occur, drink only clear  liquids until the nausea and/or vomiting subsides. Call your physician if vomiting continues.  Special Instructions/Symptoms: Your throat may feel dry or sore from the anesthesia or the breathing tube placed in your throat during surgery. If this causes discomfort, gargle with warm salt water. The discomfort should disappear within 24 hours.  If you had a scopolamine patch placed behind your ear for the management of post- operative nausea and/or vomiting:  1. The medication in the patch is effective for 72 hours, after which it should be removed.  Wrap patch in a tissue and discard in the trash. Wash hands thoroughly with soap and water. 2. You may remove the patch earlier than 72 hours if you experience unpleasant side effects which may include dry mouth, dizziness or visual disturbances. 3. Avoid touching the patch.  Wash your hands with soap and water after contact with the patch.      Oxycodone given at 2:30pm today 02/24/22

## 2022-02-24 NOTE — Op Note (Signed)
DATE OF OPERATION: 02/24/2022  LOCATION: [facility]  PREOPERATIVE DIAGNOSIS: Left posterior shoulder/back mass  POSTOPERATIVE DIAGNOSIS: Same  PROCEDURE: Excision of left posterior shoulder/back mass 5 x 5 cm (submuscular lipoma)  SURGEON: Jammal Sarr Sanger Trenita Hulme, DO  ASSISTANT: Donnamarie Rossetti, PA  EBL: 5 cc  CONDITION: Stable  COMPLICATIONS: None  INDICATION: The patient, Carol [name], is a 56 y.o. female born on 09-21-1965, is here for treatment of a mass on the posterior left shoulder / back area.   PROCEDURE DETAILS:  The patient was seen prior to surgery and marked.  The IV antibiotics were given. The patient was taken to the operating room and given a general anesthetic. A standard time out was performed and all information was confirmed by those in the room. SCDs were placed.   The patient was placed in the right lateral position. She was prepped and draped.  Local was placed in subcutaneous area for intraoperative hemostasis and postop pain control.  The 15 blade was used to make an incision through the skin.  The Bovie was used to dissect to the fatty layer.  The fat was slightly fibrous.  I did not see a distinct mass.  I resected some of the fat and sent it for specimen.  It still felt abnormal.  I reviewed the CT scan and felt that there was something that was in the deeper structures.  Therefore I retracted the latissimus and the serratus.  Once I did that I was able to locate a 5 x 5 cm fatty mass.  I was able to resect this bluntly and by releasing it from the underlying structures.  The mass was sent to pathology.  Hemostasis was achieved with electrocautery.  The muscles were only retracted and not cut so they went back to their proper location.  I irrigated the wound and there was no bleeding.  The deep layers were closed with 3-0 Monocryl and the skin was closed with 4-0 Monocryl.  Dermabond and Steri-Strips were applied with a sterile dressing. The  patient was allowed to wake up and taken to recovery room in stable condition at the end of the case. The family was notified at the end of the case.   The advanced practice practitioner (APP) assisted throughout the case.  The APP was essential in retraction and counter traction when needed to make the case progress smoothly.  This retraction and assistance made it possible to see the tissue plans for the procedure.  The assistance was needed for blood control, tissue re-approximation and assisted with closure of the incision site.

## 2022-02-24 NOTE — Anesthesia Postprocedure Evaluation (Signed)
Anesthesia Post Note  Patient: Carol [name]  Procedure(s) Performed: EXCISION LIPOMA/MASS LEFT AXILLARY (Left: Back) LIPOSUCTION (Left: Back)     Patient location during evaluation: Phase II Anesthesia Type: General Level of consciousness: awake and alert, patient cooperative and oriented Pain management: pain level controlled Vital Signs Assessment: post-procedure vital signs reviewed and stable Respiratory status: spontaneous breathing, nonlabored ventilation and respiratory function stable Cardiovascular status: blood pressure returned to baseline and stable Postop Assessment: no apparent nausea or vomiting and adequate PO intake Anesthetic complications: no   No notable events documented.  Last Vitals:  Vitals:   02/24/22 1345 02/24/22 1400  BP: (!) 162/82 (!) 166/92  Pulse: 84 86  Resp: 13 14  Temp:    SpO2: 100% 93%    Last Pain:  Vitals:   02/24/22 1400  TempSrc:   PainSc: 4                  Jadien Lehigh,E. Chloe Baig

## 2022-02-25 ENCOUNTER — Encounter (HOSPITAL_BASED_OUTPATIENT_CLINIC_OR_DEPARTMENT_OTHER): Payer: Self-pay | Admitting: Plastic Surgery

## 2022-02-25 LAB — SURGICAL PATHOLOGY

## 2022-03-11 ENCOUNTER — Encounter: Payer: Self-pay | Admitting: Plastic Surgery

## 2022-03-11 ENCOUNTER — Ambulatory Visit (INDEPENDENT_AMBULATORY_CARE_PROVIDER_SITE_OTHER): Payer: 59 | Admitting: Plastic Surgery

## 2022-03-11 DIAGNOSIS — D1722 Benign lipomatous neoplasm of skin and subcutaneous tissue of left arm: Secondary | ICD-10-CM

## 2022-03-11 NOTE — Progress Notes (Signed)
   Subjective:    Patient ID: Carol [name], female    DOB: [birth year], 56 y.o.   MRN: [id number]  The patient is a 56 year old female here for follow-up after undergoing excision of a mass on the left axilla.  The pathology showed a lipoma.  Today she says is a little bit tender.  I went ahead and drained it and got 50 cc of serosanguineous drainage.  She felt better immediately.  Steri-Strips in place and the incision is healing nicely.      Review of Systems  Constitutional: Negative.   Eyes: Negative.   Respiratory: Negative.    Cardiovascular: Negative.        Objective:   Physical Exam Pulmonary:     Effort: Pulmonary effort is normal.  Neurological:     Mental Status: She is alert and oriented to person, place, and time.  Psychiatric:        Mood and Affect: Mood normal.        Behavior: Behavior normal.        Thought Content: Thought content normal.         Assessment & Plan:     ICD-10-CM   1. Lipoma of left axilla  D17.22       Patient is requesting to go back to work.  That should be fine just do not overdo activity on the left side.  Successful drainage of seroma.  Follow-up in 2 to 3 weeks.

## 2022-03-21 NOTE — Progress Notes (Signed)
Patient is a 56 year old female with history of left posterior shoulder/back mass.  Patient underwent lipoma excision with Dr. Marla Roe on 02/24/2022.  Surgical pathology showed lipoma.  Patient presents to the clinic for postoperative follow-up.  Patient was last seen in the clinic on 03/11/2022 by Dr. Marla Roe.  At this visit, patient reported the area felt a little tender.  Approximately 50 cc of serosanguineous drainage were aspirated, and patient immediately felt better.  Incision was found to be healing nicely.  Today,

## 2022-03-25 ENCOUNTER — Ambulatory Visit (INDEPENDENT_AMBULATORY_CARE_PROVIDER_SITE_OTHER): Payer: 59 | Admitting: Physician Assistant

## 2022-03-25 DIAGNOSIS — D1722 Benign lipomatous neoplasm of skin and subcutaneous tissue of left arm: Secondary | ICD-10-CM

## 2022-03-25 NOTE — Progress Notes (Signed)
Patient is a 56 year old female with history of left posterior shoulder/back mass.  Patient underwent lipoma excision with Dr. Marla Roe on 02/24/2022.  Surgical pathology showed lipoma.  Patient presents to the clinic for postoperative follow-up.  Patient was last seen in the clinic on 03/11/2022 by Dr. Marla Roe.  At this visit, patient reported the area felt a little tender.  Approximately 50 cc of serosanguineous drainage were aspirated, and patient immediately felt better.  Incision was found to be healing nicely.  Today the patient is doing well, she notes some pain at the operative site which has improved.  She notes compared to her previous office visit she does not have the same amount of swelling but does note some continued edema at the area.  On exam her left axillary incision is clean dry and intact with routine healing, no surrounding redness or discharge, she does have tenderness with palpation to the soft tissue, no obvious fluid collection noted.  Dr. Marla Roe at the opportunity to personally evaluate the patient at this time we would recommend she follow-up in 1 week for repeat evaluation or sooner as needed.  Patient verbalized understanding and agreement to today's plan had no further questions or concerns.

## 2022-04-07 ENCOUNTER — Ambulatory Visit (INDEPENDENT_AMBULATORY_CARE_PROVIDER_SITE_OTHER): Payer: 59 | Admitting: Physician Assistant

## 2022-04-07 ENCOUNTER — Encounter: Payer: Self-pay | Admitting: Physician Assistant

## 2022-04-07 DIAGNOSIS — D1722 Benign lipomatous neoplasm of skin and subcutaneous tissue of left arm: Secondary | ICD-10-CM

## 2022-04-07 NOTE — Progress Notes (Signed)
This is a 56 year old female seen in our office for follow-up evaluation status post lipoma excision of the left axilla by Dr. Marla Roe on 02/24/2022.  She was most recently seen in the clinic on 03/25/2022.  She had previously had a small seroma that was drained at previous visit.  Today she denies any significant swelling or edema, she denies any infectious symptoms.  She does note some ongoing pain anterior to the incision site, worse when she moves her right shoulder or leans on her right elbow.  She notes very minimal pain at rest.  On exam the patient's left axilla incision is clean dry and intact with routine healing, no surrounding redness warmth, swelling, minimal tenderness palpation of the left axilla anterior to the incision site.  Overall the patient is doing well postoperatively.  Her incision is healing well, she has no signs of swelling or infection.  She does have some pain in the left axilla which is likely secondary to underlying tissues including muscle.  She may begin gentle range of motion exercises and icing of the area.  At this time she does not require further evaluation management from our perspective, we are happy to see her in follow-up as needed.  The patient verbalized understanding and agreement to today's plan had no further questions or concerns.

## 2022-04-10 ENCOUNTER — Other Ambulatory Visit: Payer: Self-pay

## 2022-04-10 ENCOUNTER — Emergency Department (HOSPITAL_COMMUNITY)
Admission: EM | Admit: 2022-04-10 | Discharge: 2022-04-10 | Disposition: A | Discharge status: Home / Self Care | Payer: 59 | Attending: Emergency Medicine | Admitting: Emergency Medicine

## 2022-04-10 ENCOUNTER — Emergency Department (HOSPITAL_COMMUNITY): Payer: 59

## 2022-04-10 ENCOUNTER — Encounter (HOSPITAL_COMMUNITY): Payer: Self-pay | Admitting: Emergency Medicine

## 2022-04-10 DIAGNOSIS — R1031 Right lower quadrant pain: Secondary | ICD-10-CM | POA: Insufficient documentation

## 2022-04-10 DIAGNOSIS — U071 COVID-19: Secondary | ICD-10-CM | POA: Insufficient documentation

## 2022-04-10 DIAGNOSIS — R1011 Right upper quadrant pain: Secondary | ICD-10-CM | POA: Insufficient documentation

## 2022-04-10 DIAGNOSIS — Z91040 Latex allergy status: Secondary | ICD-10-CM | POA: Diagnosis not present

## 2022-04-10 DIAGNOSIS — R531 Weakness: Secondary | ICD-10-CM | POA: Diagnosis present

## 2022-04-10 DIAGNOSIS — R739 Hyperglycemia, unspecified: Secondary | ICD-10-CM | POA: Diagnosis not present

## 2022-04-10 DIAGNOSIS — B349 Viral infection, unspecified: Secondary | ICD-10-CM

## 2022-04-10 LAB — COMPREHENSIVE METABOLIC PANEL
ALT: 22 U/L (ref 0–44)
AST: 22 U/L (ref 15–41)
Albumin: 3.9 g/dL (ref 3.5–5.0)
Alkaline Phosphatase: 104 U/L (ref 38–126)
Anion gap: 7 (ref 5–15)
BUN: 7 mg/dL (ref 6–20)
CO2: 29 mmol/L (ref 22–32)
Calcium: 9 mg/dL (ref 8.9–10.3)
Chloride: 99 mmol/L (ref 98–111)
Creatinine, Ser: 0.66 mg/dL (ref 0.44–1.00)
GFR, Estimated: >60 mL/min (ref >60)
Glucose, Bld: 321 mg/dL — ABNORMAL HIGH (ref 70–99)
Potassium: 3.6 mmol/L (ref 3.5–5.1)
Sodium: 135 mmol/L (ref 135–145)
Total Bilirubin: 0.6 mg/dL (ref 0.3–1.2)
Total Protein: 7.8 g/dL (ref 6.5–8.1)

## 2022-04-10 LAB — CBC
HCT: 39.7 % (ref 36.0–46.0)
Hemoglobin: 13.5 g/dL (ref 12.0–15.0)
MCH: 28.7 pg (ref 26.0–34.0)
MCHC: 34 g/dL (ref 30.0–36.0)
MCV: 84.5 fL (ref 80.0–100.0)
Platelets: 339 10*3/uL (ref 150–400)
RBC: 4.7 MIL/uL (ref 3.87–5.11)
RDW: 12.1 % (ref 11.5–15.5)
WBC: 5.6 10*3/uL (ref 4.0–10.5)
nRBC: 0 % (ref 0.0–0.2)

## 2022-04-10 LAB — URINALYSIS, ROUTINE W REFLEX MICROSCOPIC
Bacteria, UA: NONE SEEN
Bilirubin Urine: NEGATIVE
Glucose, UA: >=500 mg/dL — AB
Ketones, ur: NEGATIVE mg/dL
Leukocytes,Ua: NEGATIVE
Nitrite: NEGATIVE
Protein, ur: NEGATIVE mg/dL
Specific Gravity, Urine: 1.017 (ref 1.005–1.030)
pH: 6 (ref 5.0–8.0)

## 2022-04-10 LAB — RESP PANEL BY RT-PCR (FLU A&B, COVID) ARPGX2
Influenza A by PCR: NEGATIVE
Influenza B by PCR: NEGATIVE
SARS Coronavirus 2 by RT PCR: POSITIVE — AB

## 2022-04-10 LAB — LIPASE, BLOOD: Lipase: 32 U/L (ref 11–51)

## 2022-04-10 MED ORDER — OXYCODONE-ACETAMINOPHEN 5-325 MG PO TABS
1.0000 | ORAL_TABLET | Freq: Once | ORAL | Status: AC
  Administered 2022-04-10: 1 via ORAL
  Filled 2022-04-10: qty 1

## 2022-04-10 MED ORDER — ONDANSETRON 4 MG PO TBDP
4.0000 mg | ORAL_TABLET | Freq: Once | ORAL | Status: AC
  Administered 2022-04-10: 4 mg via ORAL
  Filled 2022-04-10: qty 1

## 2022-04-10 MED ORDER — IOHEXOL 300 MG/ML  SOLN
100.0000 mL | Freq: Once | INTRAMUSCULAR | Status: AC | PRN
Start: 2022-04-10 — End: 2022-04-10
  Administered 2022-04-10: 100 mL via INTRAVENOUS

## 2022-04-10 MED ORDER — ONDANSETRON 4 MG PO TBDP: 4.0000 mg | ORAL_TABLET | Freq: Three times a day (TID) | ORAL | 0 refills | Status: DC | PRN

## 2022-04-10 MED ORDER — ONDANSETRON HCL 4 MG/2ML IJ SOLN: 4.0000 mg | Freq: Once | INTRAMUSCULAR | Status: DC

## 2022-04-10 MED ORDER — METFORMIN HCL 500 MG PO TABS: 500.0000 mg | ORAL_TABLET | Freq: Two times a day (BID) | ORAL | 0 refills | Status: DC

## 2022-04-10 MED ORDER — LACTATED RINGERS IV BOLUS
1000.0000 mL | Freq: Once | INTRAVENOUS | Status: AC
  Administered 2022-04-10: 1000 mL via INTRAVENOUS

## 2022-04-10 NOTE — ED Provider Triage Note (Signed)
Emergency Medicine Provider Triage Evaluation Note  Carol [name] , a 56 y.o. female  was evaluated in triage.  Pt complains of abdominal pain, nausea without vomiting, diarrhea since yesterday night.  Patient reports that she thinks that she has food poisoning after eating some [employer] cheeseburgers.  Patient denies any fever, chills, dysuria, hematuria.  She reports her abdominal pain is in the upper portion.  She reports it is crampy in nature.  While I was seeing patient she had already had some of her lab work back including a glucose which was elevated in the 300s.  Patient reports that she has had no previous diagnosis of diabetes, she does not currently have a primary care doctor.  She did not know that she had high blood sugar.  She is not currently taking any steroids.  Review of Systems  Positive: Abdominal pain, nausea Negative: Vomiting, diarrhea  Physical Exam  BP (!) 144/76   Pulse 100   Temp 99.5 F (37.5 C) (Oral)   Resp 16   Ht 5' 7.5" (1.715 m)   Wt 74.8 kg   SpO2 97%   BMI 25.46 kg/m  Gen:   Awake, no distress   Resp:  Normal effort  MSK:   Moves extremities without difficulty  Other:  Patient is tender to palpation in the epigastric region without rebound, rigidity, guarding.  Medical Decision Making  Medically screening exam initiated at 3:07 PM.  Appropriate orders placed.  Carol [name] was informed that the remainder of the evaluation will be completed by another provider, this initial triage assessment does not replace that evaluation, and the importance of remaining in the ED until their evaluation is complete.  Workup initiated Pain medicine, nausea medication administered in triage, I do think that although most of the patient's work-up is back at this point she would be better treated in a room as she has a possible new diagnosis of diabetes, as well as urinalysis is still pending at this time.   Carol [name], [place] 04/10/22 [phone]

## 2022-04-10 NOTE — ED Provider Notes (Signed)
[facility] EMERGENCY DEPARTMENT Provider Note   CSN: [id number] Arrival date & time: 04/10/22  1208     History  Chief Complaint  Patient presents with   Abdominal Pain    Carol [name] is a 56 y.o. female.  HPI 56 year old female presents with abdominal pain, nausea, body aches and weakness.  Started yesterday after eating a meal at around 5 PM.  Feels like she has food poisoning.  She has been able to tolerate fluids like water and 1/2 cup of orange juice this morning but has not eaten anything.  No diarrhea or urinary symptoms.  Her pain and nausea feels like it is in her right upper quadrant.  She denies any chronic medical issues.  A little bit of a cough starting today.  No chest pain.  Home Medications Prior to Admission medications   Medication Sig Start Date End Date Taking? Authorizing Provider  ibuprofen (ADVIL) 800 MG tablet Take 1 tablet (800 mg total) by mouth 3 (three) times daily. Take with food 01/22/22   Triplett, Tammy, PA-C      Allergies    Naftin [naftifine hcl] and Latex    Review of Systems   Review of Systems  Constitutional:  Positive for fever (low grade, 99.5).  Respiratory:  Positive for cough. Negative for shortness of breath.   Cardiovascular:  Negative for chest pain.  Gastrointestinal:  Positive for abdominal pain and nausea. Negative for diarrhea and vomiting.  Genitourinary:  Negative for dysuria.  Musculoskeletal:  Positive for myalgias.  Neurological:  Positive for weakness.    Physical Exam Updated Vital Signs BP (!) 144/76   Pulse 100   Temp 99.5 F (37.5 C) (Oral)   Resp 16   Ht 5' 7.5" (1.715 m)   Wt 74.8 kg   SpO2 97%   BMI 25.46 kg/m  Physical Exam Vitals and nursing note reviewed.  Constitutional:      General: She is not in acute distress.    Appearance: She is well-developed. She is not ill-appearing or diaphoretic.  HENT:     Head: Normocephalic and atraumatic.  Cardiovascular:     Rate and Rhythm: Normal rate  and regular rhythm.     Heart sounds: Normal heart sounds.  Pulmonary:     Effort: Pulmonary effort is normal.     Breath sounds: Normal breath sounds.  Abdominal:     Palpations: Abdomen is soft.     Tenderness: There is abdominal tenderness in the right upper quadrant and right lower quadrant.  Skin:    General: Skin is warm and dry.  Neurological:     Mental Status: She is alert.     ED Results / Procedures / Treatments   Labs (all labs ordered are listed, but only abnormal results are displayed) Labs Reviewed  COMPREHENSIVE METABOLIC PANEL - Abnormal; Notable for the following components:      Result Value   Glucose, Bld 321 (*)    All other components within normal limits  URINALYSIS, ROUTINE W REFLEX MICROSCOPIC - Abnormal; Notable for the following components:   Glucose, UA >=500 (*)    Hgb urine dipstick MODERATE (*)    All other components within normal limits  RESP PANEL BY RT-PCR (FLU A&B, COVID) ARPGX2  LIPASE, BLOOD  CBC    EKG None  Radiology CT ABDOMEN PELVIS W CONTRAST  Result Date: 04/10/2022 CLINICAL DATA:  RIGHT lower quadrant abdominal pain and nausea since last night, patient thinks she has food poisoning EXAM:  CT ABDOMEN AND PELVIS WITH CONTRAST TECHNIQUE: Multidetector CT imaging of the abdomen and pelvis was performed using the standard protocol following bolus administration of intravenous contrast. RADIATION DOSE REDUCTION: This exam was performed according to the departmental dose-optimization program which includes automated exposure control, adjustment of the mA and/or kV according to patient size and/or use of iterative reconstruction technique. CONTRAST:  132m OMNIPAQUE IOHEXOL 300 MG/ML SOLN IV. No oral contrast. COMPARISON:  None FINDINGS: Lower chest: Lung bases clear Hepatobiliary: Gallbladder surgically absent. Liver normal appearance. Pancreas: Normal appearance Spleen: Normal appearance Adrenals/Urinary Tract: Adrenal glands, kidneys,  ureters, and bladder normal appearance Stomach/Bowel: Normal appendix, retrocecal. Stomach and bowel loops normal appearance. Vascular/Lymphatic: Few scattered pelvic phleboliths. Aorta normal caliber with mild atherosclerotic calcification. No adenopathy. Reproductive: Uterus surgically absent.  Normal appearing ovaries. Other: No free air or free fluid. No hernia or inflammatory process. Musculoskeletal: No acute osseous findings. IMPRESSION: Prior cholecystectomy and hysterectomy. No acute intra-abdominal or intrapelvic abnormalities. Aortic Atherosclerosis (ICD10-I70.0). Electronically Signed   By: MLavonia DanaM.D.   On: 04/10/2022 18:15    Procedures Procedures    Medications Ordered in ED Medications  lactated ringers bolus 1,000 mL (has no administration in time range)  oxyCODONE-acetaminophen (PERCOCET/ROXICET) 5-325 MG per tablet 1 tablet (1 tablet Oral Given 04/10/22 1519)  ondansetron (ZOFRAN-ODT) disintegrating tablet 4 mg (4 mg Oral Given 04/10/22 1520)    ED Course/ Medical Decision Making/ A&P                           Medical Decision Making Amount and/or Complexity of Data Reviewed Radiology: ordered.  Risk Prescription drug management.   Patient presents with what seems like a viral illness. No vomiting here. Given oxycodone and zofran in triage, is feeling much better. Given IV fluid bolus as well. Given right sided abd pain, CT obtained. Personally viewed/interpreted, no appendicitis. She's feeling much better and is ready for discharge. Covid test pending.  Of note, she's also hyperglycemic. No DKA. No diagnosis of diabetes but I suspect this is indicative of type 2 diabetes. Will start on metformin and recommend acquiring a PCP for outpatient follow up and long term treatment. Given return precautions.        Final Clinical Impression(s) / ED Diagnoses Final diagnoses:  Hyperglycemia  Viral illness    Rx / DC Orders ED Discharge Orders     None          GSherwood Gambler MD 04/11/22 [phone]

## 2022-04-10 NOTE — Discharge Instructions (Signed)
Your glucose is elevated around 300 today.  This is concerning for diabetes.  You are being started on metformin which helps treat diabetes.  It is important to follow-up with a primary care physician for definitive testing and long-term management.  You likely have a viral illness.  You are being prescribed antinausea medicine to help with this.  Be sure to drink plenty of fluids.  If you develop worsening, continued, or recurrent abdominal pain, uncontrolled vomiting, fever, chest or back pain, or any other new/concerning symptoms then return to the ER for evaluation.

## 2022-04-10 NOTE — ED Triage Notes (Signed)
Pt c/o abd pain with nausea since last night. Denies v/d. Reports she thinks she has food poisoning from [employer].

## 2023-01-18 ENCOUNTER — Encounter (HOSPITAL_COMMUNITY): Payer: Self-pay | Admitting: Emergency Medicine

## 2023-01-18 ENCOUNTER — Emergency Department (HOSPITAL_COMMUNITY)
Admission: EM | Admit: 2023-01-18 | Discharge: 2023-01-18 | Disposition: A | Discharge status: Home / Self Care | Payer: 59 | Attending: Student | Admitting: Student

## 2023-01-18 ENCOUNTER — Emergency Department (HOSPITAL_COMMUNITY): Payer: 59

## 2023-01-18 ENCOUNTER — Other Ambulatory Visit: Payer: Self-pay

## 2023-01-18 DIAGNOSIS — Z91040 Latex allergy status: Secondary | ICD-10-CM | POA: Insufficient documentation

## 2023-01-18 DIAGNOSIS — M25512 Pain in left shoulder: Secondary | ICD-10-CM | POA: Insufficient documentation

## 2023-01-18 DIAGNOSIS — F17210 Nicotine dependence, cigarettes, uncomplicated: Secondary | ICD-10-CM | POA: Diagnosis not present

## 2023-01-18 DIAGNOSIS — S4982XA Other specified injuries of left shoulder and upper arm, initial encounter: Secondary | ICD-10-CM | POA: Diagnosis not present

## 2023-01-18 DIAGNOSIS — M19012 Primary osteoarthritis, left shoulder: Secondary | ICD-10-CM | POA: Diagnosis not present

## 2023-01-18 MED ORDER — NAPROXEN 375 MG PO TABS: 375.0000 mg | ORAL_TABLET | Freq: Two times a day (BID) | ORAL | 0 refills | Status: DC

## 2023-01-18 MED ORDER — LIDOCAINE 5 % EX PTCH
1.0000 | MEDICATED_PATCH | CUTANEOUS | Status: DC
  Administered 2023-01-18: 1 via TRANSDERMAL
  Filled 2023-01-18: qty 1

## 2023-01-18 MED ORDER — KETOROLAC TROMETHAMINE 15 MG/ML IJ SOLN
15.0000 mg | Freq: Once | INTRAMUSCULAR | Status: AC
Start: 2023-01-18 — End: 2023-01-18
  Administered 2023-01-18: 15 mg via INTRAMUSCULAR
  Filled 2023-01-18: qty 1

## 2023-01-18 MED ORDER — LIDOCAINE 5 % EX PTCH: 1.0000 | MEDICATED_PATCH | CUTANEOUS | 0 refills | Status: DC

## 2023-01-18 NOTE — ED Provider Notes (Signed)
[place] EMERGENCY DEPARTMENT AT [facility] Provider Note  CSN: [id number] Arrival date & time: 01/18/23 0935  Chief Complaint(s) Arm Injury  HPI Carol [name] is a 57 y.o. female with PMH left axillary lipoma removal in July 2023 who presents emergency room for evaluation of left shoulder pain.  Patient states that for the last 2 to 3 weeks she has had progressive worsening pain in the left shoulder down into the left bicep.  No known traumatic injury but she is having difficulty sleeping due to the pain.  Denies numbness, tingling, weakness of the upper extremity.  Denies chest pain, shortness of breath, headache, fever or other systemic symptoms.   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 Authorizing Provider  Cephalexin 500 MG tablet Take 500 mg by mouth 4 (four) times daily. 02/12/22   [provider]  ibuprofen (ADVIL) 800 MG tablet Take 1 tablet (800 mg total) by mouth 3 (three) times daily. Take with food 01/22/22   Triplett, Tammy, PA-C  metFORMIN (GLUCOPHAGE) 500 MG tablet Take 1 tablet (500 mg total) by mouth 2 (two) times daily with a meal. 04/10/22   Pricilla Loveless, MD  ondansetron (ZOFRAN-ODT) 4 MG disintegrating tablet Take 1 tablet (4 mg total) by mouth every 8 (eight) hours as needed for nausea or vomiting. 04/10/22   Pricilla Loveless, MD                                                                                                                                    Past Surgical History Past Surgical History:  Procedure Laterality Date   ABDOMINAL  HYSTERECTOMY  2002   CHOLECYSTECTOMY     LIPOMA EXCISION Left 02/24/2022   Procedure: EXCISION LIPOMA/MASS LEFT AXILLARY;  Surgeon: Peggye Form, DO;  Location: [facility];  Service: Plastics;  Laterality: Left;   LIPOSUCTION Left 02/24/2022   Procedure: LIPOSUCTION;  Surgeon: Peggye Form, DO;  Location: [facility];  Service: Plastics;  Laterality: Left;   TOE ARTHROPLASTY Bilateral 05/16/2020   Procedure: DEROTATIONAL ARTHROPLASTY OF FIFTH DIGIT RIGHT FOOT,DEROTATIONAL ARTHROPLASTY OF FIFTH DIGIT LEFT FOOT,EXOSTECTOMY OF FIFTH DIGIT RIGHT FOOT AND LEFT FOOT;  Surgeon: Ferman Hamming, DPM;  Location: [facility];  Service: Podiatry;  Laterality: Bilateral;   TUBAL LIGATION     Family History Family History  Problem Relation Age of Onset   Arthritis Paternal Grandmother  Diabetes Paternal Grandmother    Hypertension Paternal Grandmother    Stroke Paternal Grandmother    Diabetes Mother    Colon cancer Neg Hx     Social History Social History   Tobacco Use   Smoking status: Every Day    Packs/day: 1.00    Years: 20.00    Additional pack years: 0.00    Total pack years: 20.00    Types: Cigarettes, Cigars    Last attempt to quit: 02/11/2022    Years since quitting: 0.9   Smokeless tobacco: Never   Tobacco comments:    Tobacco info given  Vaping Use   Vaping Use: Never used  Substance Use Topics   Alcohol use: Yes    Comment: occ.   Drug use: No   Allergies Naftin [naftifine hcl] and Latex  Review of Systems Review of Systems  Musculoskeletal:  Positive for arthralgias, joint swelling and myalgias.    Physical Exam Vital Signs  I have reviewed the triage vital signs BP (!) 187/88   Pulse 92   Temp 97.8 F (36.6 C) (Oral)   Ht 5\' 7"  (1.702 m)   Wt 72.6 kg   SpO2 99%   BMI 25.06 kg/m   Physical Exam Vitals and nursing note reviewed.  Constitutional:      General: She is not in acute distress.    Appearance: She  is well-developed.  HENT:     Head: Normocephalic and atraumatic.  Eyes:     Conjunctiva/sclera: Conjunctivae normal.  Cardiovascular:     Rate and Rhythm: Normal rate and regular rhythm.     Heart sounds: No murmur heard. Pulmonary:     Effort: Pulmonary effort is normal. No respiratory distress.     Breath sounds: Normal breath sounds.  Abdominal:     Palpations: Abdomen is soft.     Tenderness: There is no abdominal tenderness.  Musculoskeletal:        General: Swelling and tenderness present.     Cervical back: Neck supple.  Skin:    General: Skin is warm and dry.     Capillary Refill: Capillary refill takes less than 2 seconds.  Neurological:     Mental Status: She is alert.  Psychiatric:        Mood and Affect: Mood normal.     ED Results and Treatments Labs (all labs ordered are listed, but only abnormal results are displayed) Labs Reviewed - No data to display                                                                                                                        Radiology No results found.  Pertinent labs & imaging results that were available during my care of the patient were reviewed by me and considered in my medical decision making (see MDM for details).  Medications Ordered in ED Medications  ketorolac (TORADOL) 15 MG/ML injection 15 mg (has no administration in time range)  lidocaine (LIDODERM) 5 %  1 patch (has no administration in time range)                                                                                                                                     Procedures Procedures  (including critical care time)  Medical Decision Making / ED Course   This patient presents to the ED for concern of shoulder pain, this involves an extensive number of treatment options, and is a complaint that carries with it a high risk of complications and morbidity.  The differential diagnosis includes rotator cuff injury, fracture,  arthritis, ligamentous injury, contusion, hematoma, mass  MDM: Patient seen emergency room for evaluation of left shoulder pain.  Physical exam with tenderness over the deltoid, pectoralis major and into the bicep.  Neurologic exam unremarkable.  X-ray shoulder with mild evidence of degenerative changes but no acute pathology.  Symptoms significantly improved with Toradol and a lidocaine patch.  Patient will be placed in a sling for comfort given I placed a referral to outpatient orthopedics to have patient further evaluated for rotator cuff pathology vs inflammatory arthritis.  Patient will be discharged with Naprosyn and Lidoderm and a sling was provided for comfort.  She does not meet inpatient criteria and was discharged with outpatient follow-up.   Additional history obtained: -External records from outside source obtained and reviewed including: Chart review including previous notes, labs, imaging, consultation notes   Imaging Studies ordered: I ordered imaging studies including x-ray shoulder I independently visualized and interpreted imaging. I agree with the radiologist interpretation   Medicines ordered and prescription drug management: Meds ordered this encounter  Medications   ketorolac (TORADOL) 15 MG/ML injection 15 mg   lidocaine (LIDODERM) 5 % 1 patch    -I have reviewed the patients home medicines and have made adjustments as needed  Critical interventions none  Cardiac Monitoring: The patient was maintained on a cardiac monitor.  I personally viewed and interpreted the cardiac monitored which showed an underlying rhythm of: NSR  Social Determinants of Health:  Factors impacting patients care include: Does not currently have a PCP, list of PCP in local area provided   Reevaluation: After the interventions noted above, I reevaluated the patient and found that they have :improved  Co morbidities that complicate the patient evaluation  Past Medical History:   Diagnosis Date   Back pain    Bronchitis    Cervicalgia    History of blood in urine    microscopic   History of migraine headaches    Impaired fasting glucose    Smoker    1ppd      Dispostion: I considered admission for this patient, but at this time she does not meet inpatient criteria for admission and she was discharged with outpatient orthopedic follow-up     Final Clinical Impression(s) / ED Diagnoses Final diagnoses:  None     @PCDICTATION @    Alicja Everitt,  Wyn Forster, MD 01/18/23 [phone]

## 2023-01-18 NOTE — ED Triage Notes (Signed)
Pt c/o of arm pain that started one week ago. No injury known to arm. Pt states she had a lymphnode removed x1year ago and states the pain is from that.

## 2023-01-28 ENCOUNTER — Ambulatory Visit: Payer: 59 | Admitting: Orthopedic Surgery

## 2023-02-27 ENCOUNTER — Ambulatory Visit: Payer: Self-pay | Admitting: Orthopedic Surgery

## 2023-05-11 ENCOUNTER — Other Ambulatory Visit (HOSPITAL_COMMUNITY): Payer: Self-pay | Admitting: Internal Medicine

## 2023-05-11 ENCOUNTER — Other Ambulatory Visit (HOSPITAL_COMMUNITY): Payer: Self-pay | Admitting: Gerontology

## 2023-05-11 DIAGNOSIS — Z87891 Personal history of nicotine dependence: Secondary | ICD-10-CM

## 2023-05-11 DIAGNOSIS — Z1231 Encounter for screening mammogram for malignant neoplasm of breast: Secondary | ICD-10-CM

## 2023-05-15 ENCOUNTER — Encounter: Payer: Self-pay | Admitting: *Deleted

## 2023-05-22 ENCOUNTER — Ambulatory Visit (HOSPITAL_COMMUNITY)
Admission: RE | Admit: 2023-05-22 | Discharge: 2023-05-22 | Disposition: A | Discharge status: Home / Self Care | Payer: No Typology Code available for payment source | Source: Ambulatory Visit | Attending: Internal Medicine | Admitting: Internal Medicine

## 2023-05-22 DIAGNOSIS — Z1231 Encounter for screening mammogram for malignant neoplasm of breast: Secondary | ICD-10-CM | POA: Insufficient documentation

## 2023-06-30 ENCOUNTER — Encounter: Payer: Self-pay | Admitting: Gastroenterology

## 2023-06-30 ENCOUNTER — Telehealth: Payer: Self-pay | Admitting: *Deleted

## 2023-06-30 ENCOUNTER — Ambulatory Visit (INDEPENDENT_AMBULATORY_CARE_PROVIDER_SITE_OTHER): Payer: No Typology Code available for payment source | Admitting: Gastroenterology

## 2023-06-30 ENCOUNTER — Ambulatory Visit: Payer: No Typology Code available for payment source | Admitting: Gastroenterology

## 2023-06-30 VITALS — BP 134/79 | HR 88 | Temp 98.1°F | Ht 67.5 in | Wt 148.8 lb

## 2023-06-30 DIAGNOSIS — Z1211 Encounter for screening for malignant neoplasm of colon: Secondary | ICD-10-CM | POA: Insufficient documentation

## 2023-06-30 DIAGNOSIS — Z01818 Encounter for other preprocedural examination: Secondary | ICD-10-CM | POA: Diagnosis not present

## 2023-06-30 DIAGNOSIS — Z860101 Personal history of adenomatous and serrated colon polyps: Secondary | ICD-10-CM | POA: Diagnosis not present

## 2023-06-30 NOTE — Progress Notes (Signed)
GI Office Note    Referring Provider: Benetta Spar* Primary Care Physician:  Benetta Spar, MD  Primary Gastroenterologist: Hennie Duos. Marletta Lor, DO   Chief Complaint   Chief Complaint  Patient presents with   Colonoscopy     History of Present Illness   Carol [name] is a 57 y.o. female presenting today for consideration of colonoscopy.  We received referral for screening colonoscopy, patient marked blood in the stool on her questionnaire.  Patient has a history of colon polyps.  States that she was not aware that she was supposed to have a 5-year follow-up colonoscopy.  BM most days. No melena, brbpr. Feels bloated if does not have BM.  No upper GI symptoms.  She misunderstood the questionnaire, she has not seen any blood in the stool.  Actually she was referring to having microscopic hematuria.  EGD 2014: Normal  Colonoscopy 2014: -Sessile polyp measuring 6 mm in the descending colon removed -10 sessile polyps ranging between 3 to 5 mm in the sigmoid colon and rectum removed -Tubular and hyperplastic polyps, advised 5-year surveillance colonoscopy  Medications   Current Outpatient Medications  Medication Sig Dispense Refill   amLODipine (NORVASC) 5 MG tablet Take 5 mg by mouth every morning.     nicotine (NICODERM CQ - DOSED IN MG/24 HOURS) 14 mg/24hr patch Place 14 mg onto the skin daily.     No current facility-administered medications for this visit.    Allergies   Allergies as of 06/30/2023 - Review Complete 06/30/2023  Allergen Reaction Noted   Naftin [naftifine hcl] Other (See Comments) 09/21/2015   Latex Rash 05/03/2019    Past Medical History   Past Medical History:  Diagnosis Date   Back pain    Bronchitis    Cervicalgia    Diabetes (HCC)    History of blood in urine    microscopic   History of migraine headaches    Hypertension    Impaired fasting glucose    Smoker    1ppd    Past Surgical History   Past Surgical  History:  Procedure Laterality Date   ABDOMINAL HYSTERECTOMY  2002   CHOLECYSTECTOMY     LIPOMA EXCISION Left 02/24/2022   Procedure: EXCISION LIPOMA/MASS LEFT AXILLARY;  Surgeon: Peggye Form, DO;  Location: [facility];  Service: Plastics;  Laterality: Left;   LIPOSUCTION Left 02/24/2022   Procedure: LIPOSUCTION;  Surgeon: Peggye Form, DO;  Location: [facility];  Service: Plastics;  Laterality: Left;   TOE ARTHROPLASTY Bilateral 05/16/2020   Procedure: DEROTATIONAL ARTHROPLASTY OF FIFTH DIGIT RIGHT FOOT,DEROTATIONAL ARTHROPLASTY OF FIFTH DIGIT LEFT FOOT,EXOSTECTOMY OF FIFTH DIGIT RIGHT FOOT AND LEFT FOOT;  Surgeon: Ferman Hamming, DPM;  Location: [facility];  Service: Podiatry;  Laterality: Bilateral;   TUBAL LIGATION      Past Family History   Family History  Problem Relation Age of Onset   Arthritis Paternal Grandmother    Diabetes Paternal Grandmother    Hypertension Paternal Grandmother    Stroke Paternal Grandmother    Diabetes Mother    Colon cancer Neg Hx     Past Social History   Social History   Socioeconomic History   Marital status: Single    Spouse name: Not on file   Number of children: 2   Years of education: Not on file   Highest education level: Not on file  Occupational History   Occupation: Event organiser: [employer]  Tobacco Use   Smoking status: Every Day    Current packs/day: 0.00    Average packs/day: 1 pack/day for 20.0 years (20.0 ttl pk-yrs)    Types: Cigarettes, Cigars    Start date: 02/11/2002    Last attempt to quit: 02/11/2022    Years since quitting: 1.3   Smokeless tobacco: Never   Tobacco comments:    Tobacco info given  Vaping Use   Vaping status: Never Used  Substance and Sexual Activity   Alcohol use: Yes    Comment: occ.   Drug use: No   Sexual activity: Yes    Birth control/protection: Surgical    Comment: hyst  Other Topics Concern   Not on file  Social History  Narrative   Not on file   Social Determinants of Health   Financial Resource Strain: Not on file  Food Insecurity: Not on file  Transportation Needs: Not on file  Physical Activity: Not on file  Stress: Not on file  Social Connections: Not on file  Intimate Partner Violence: Not on file    Review of Systems   General: Negative for anorexia, weight loss, fever, chills, fatigue, weakness. Eyes: Negative for vision changes.  ENT: Negative for hoarseness, difficulty swallowing , nasal congestion. CV: Negative for chest pain, angina, palpitations, dyspnea on exertion, peripheral edema.  Respiratory: Negative for dyspnea at rest, dyspnea on exertion, cough, sputum, wheezing.  GI: See history of present illness. GU:  Negative for dysuria, hematuria, urinary incontinence, urinary frequency, nocturnal urination.  See HPI MS: Negative for joint pain, low back pain.  Derm: Negative for rash or itching.  Neuro: Negative for weakness, abnormal sensation, seizure, frequent headaches, memory loss,  confusion.  Psych: Negative for anxiety, depression, suicidal ideation, hallucinations.  Endo: Negative for unusual weight change.  Heme: Negative for bruising or bleeding. Allergy: Negative for rash or hives.  Physical Exam   BP 134/79 (BP Location: Right Arm, Patient Position: Sitting, Cuff Size: Normal)   Pulse 88   Temp 98.1 F (36.7 C) (Oral)   Ht 5' 7.5" (1.715 m)   Wt 148 lb 12.8 oz (67.5 kg)   SpO2 97%   BMI 22.96 kg/m    General: Well-nourished, well-developed in no acute distress.  Head: Normocephalic, atraumatic.   Eyes: Conjunctiva pink, no icterus. Mouth: Oropharyngeal mucosa moist and pink , no lesions erythema or exudate. Neck: Supple without thyromegaly, masses, or lymphadenopathy.  Lungs: Clear to auscultation bilaterally.  Heart: Regular rate and rhythm, no murmurs rubs or gallops.  Abdomen: Bowel sounds are normal, nontender, nondistended, no hepatosplenomegaly or  masses,  no abdominal bruits or hernia, no rebound or guarding.   Rectal: Not performed Extremities: No lower extremity edema. No clubbing or deformities.  Neuro: Alert and oriented x 4 , grossly normal neurologically.  Skin: Warm and dry, no rash or jaundice.   Psych: Alert and cooperative, normal mood and affect.  Labs   None available Imaging Studies   No results found.  Assessment/Plan:   History of adenomatous colon polyps, overdue for surveillance colonoscopy:  Colonoscopy in the near future with Dr. Marletta Lor.  ASA 2.  I have discussed the risks, alternatives, benefits with regards to but not limited to the risk of reaction to medication, bleeding, infection, perforation and the patient is agreeable to proceed. Written consent to be obtained.    Leanna Battles. Melvyn Neth, MHS, PA-C [facility]

## 2023-06-30 NOTE — Telephone Encounter (Signed)
Called pt to schedule TCS with Dr. Marletta Lor ASA 2 but no answer and not able to leave a VM. Mychart message also sent.

## 2023-06-30 NOTE — Patient Instructions (Signed)
Colonoscopy to be scheduled. See separate instructions.  ?

## 2023-07-01 ENCOUNTER — Telehealth: Payer: Self-pay | Admitting: Internal Medicine

## 2023-07-01 NOTE — Telephone Encounter (Signed)
Pt was returning a call to schedule her procedure. [phone]

## 2023-07-01 NOTE — Telephone Encounter (Signed)
Called pt, no answer nor vm .

## 2023-07-02 NOTE — Telephone Encounter (Signed)
Attempt to call pt message was "call can not be completed as dialed please try again later"

## 2023-07-06 NOTE — Telephone Encounter (Signed)
Letter mailed to pt.  

## 2023-07-08 ENCOUNTER — Encounter: Payer: Self-pay | Admitting: *Deleted

## 2023-07-08 ENCOUNTER — Other Ambulatory Visit: Payer: Self-pay | Admitting: *Deleted

## 2023-07-08 MED ORDER — PEG 3350-KCL-NA BICARB-NACL 420 G PO SOLR: 4000.0000 mL | Freq: Once | ORAL | 0 refills | Status: AC

## 2023-07-08 NOTE — Telephone Encounter (Signed)
Pt has been scheduled for 08/03/23. Instructions mailed and prep sent to the pharmacy.

## 2023-07-09 ENCOUNTER — Encounter: Payer: Self-pay | Admitting: *Deleted

## 2023-07-30 ENCOUNTER — Encounter (HOSPITAL_COMMUNITY)
Admission: RE | Admit: 2023-07-30 | Discharge: 2023-07-30 | Disposition: A | Discharge status: Home / Self Care | Payer: No Typology Code available for payment source | Source: Ambulatory Visit | Attending: Internal Medicine | Admitting: Internal Medicine

## 2023-07-31 ENCOUNTER — Telehealth: Payer: Self-pay | Admitting: *Deleted

## 2023-07-31 NOTE — Telephone Encounter (Signed)
Attempted to contact pt, message on phone "Call cannot be completed as dialed, please try again later" x 3    cannot reach. Received: [name], RN  Estudillo, Ewell Poe, CMA; Marlowe Shores, LPN; Everlena Cooper, Riggins Cisek L, LPN Good morning! Happy Friday!. We have been unable to reach [name]. Her phone states that call  cannot be completed as dialed, to call back later. Any Ideas? She is for Monday with Dr Marletta Lor.

## 2023-07-31 NOTE — Pre-Procedure Instructions (Signed)
Staff has made multiple attempts to contact patient and phone states, cannot be completed as dialed, call back later. Office messaged to let them know.

## 2023-08-02 ENCOUNTER — Encounter (HOSPITAL_COMMUNITY): Payer: Self-pay | Admitting: Anesthesiology

## 2023-08-03 ENCOUNTER — Ambulatory Visit (HOSPITAL_COMMUNITY): Admission: RE | Admit: 2023-08-03 | Payer: No Typology Code available for payment source | Source: Home / Self Care

## 2023-08-03 ENCOUNTER — Encounter (HOSPITAL_COMMUNITY): Admission: RE | Payer: Self-pay | Source: Home / Self Care

## 2023-08-03 SURGERY — COLONOSCOPY WITH PROPOFOL
Anesthesia: Monitor Anesthesia Care

## 2023-08-03 NOTE — Telephone Encounter (Signed)
Called pt and she says she will call back to reschedule once she is feeling better  TCS W/PROPOFOL ASA 2. Dr.Carver  Dennie Bible called to say she is sick and needs to reschedule Received: Today Lillia Mountain, RN  Estudillo, Ewell Poe, CMA; Elinor Dodge, LPN; Lasandra Beech,  Ms Garnett called to say she was sick and needs to reschedule her colonoscopy for today.

## 2023-08-03 NOTE — Progress Notes (Signed)
Patient called to say she is sick and needs to reschedule

## 2023-09-18 ENCOUNTER — Other Ambulatory Visit (HOSPITAL_COMMUNITY): Payer: Self-pay | Admitting: Gerontology

## 2023-09-18 ENCOUNTER — Ambulatory Visit (HOSPITAL_COMMUNITY)
Admission: RE | Admit: 2023-09-18 | Discharge: 2023-09-18 | Disposition: A | Discharge status: Home / Self Care | Payer: No Typology Code available for payment source | Source: Ambulatory Visit | Attending: Gerontology | Admitting: Gerontology

## 2023-09-18 DIAGNOSIS — M25531 Pain in right wrist: Secondary | ICD-10-CM | POA: Insufficient documentation

## 2023-11-16 ENCOUNTER — Encounter (HOSPITAL_COMMUNITY): Payer: Self-pay

## 2023-11-16 ENCOUNTER — Emergency Department (HOSPITAL_COMMUNITY)
Admission: EM | Admit: 2023-11-16 | Discharge: 2023-11-16 | Disposition: A | Discharge status: Home / Self Care | Attending: Emergency Medicine | Admitting: Emergency Medicine

## 2023-11-16 ENCOUNTER — Other Ambulatory Visit: Payer: Self-pay

## 2023-11-16 DIAGNOSIS — X500XXA Overexertion from strenuous movement or load, initial encounter: Secondary | ICD-10-CM | POA: Insufficient documentation

## 2023-11-16 DIAGNOSIS — Z91040 Latex allergy status: Secondary | ICD-10-CM | POA: Insufficient documentation

## 2023-11-16 DIAGNOSIS — S39012A Strain of muscle, fascia and tendon of lower back, initial encounter: Secondary | ICD-10-CM | POA: Diagnosis not present

## 2023-11-16 DIAGNOSIS — M545 Low back pain, unspecified: Secondary | ICD-10-CM | POA: Diagnosis present

## 2023-11-16 MED ORDER — LIDOCAINE 5 % EX PTCH: 1.0000 | MEDICATED_PATCH | CUTANEOUS | 0 refills | Status: AC

## 2023-11-16 MED ORDER — LIDOCAINE 5 % EX PTCH
1.0000 | MEDICATED_PATCH | Freq: Once | CUTANEOUS | Status: DC
  Administered 2023-11-16: 1 via TRANSDERMAL
  Filled 2023-11-16: qty 1

## 2023-11-16 MED ORDER — KETOROLAC TROMETHAMINE 30 MG/ML IJ SOLN
30.0000 mg | Freq: Once | INTRAMUSCULAR | Status: AC
  Administered 2023-11-16: 30 mg via INTRAMUSCULAR
  Filled 2023-11-16: qty 1

## 2023-11-16 MED ORDER — CYCLOBENZAPRINE HCL 10 MG PO TABS: 10.0000 mg | ORAL_TABLET | Freq: Two times a day (BID) | ORAL | 0 refills | Status: AC | PRN

## 2023-11-16 MED ORDER — ACETAMINOPHEN 325 MG PO TABS
650.0000 mg | ORAL_TABLET | Freq: Once | ORAL | Status: AC
  Administered 2023-11-16: 650 mg via ORAL
  Filled 2023-11-16: qty 2

## 2023-11-16 NOTE — Discharge Instructions (Signed)
If you develop worsening, recurrent, or continued back pain, numbness or weakness in the legs, incontinence of your bowels or bladders, numbness of your buttocks, fever, abdominal pain, or any other new/concerning symptoms then return to the ER for evaluation.  

## 2023-11-16 NOTE — ED Provider Notes (Signed)
 [place] EMERGENCY DEPARTMENT AT [facility] Provider Note   CSN: [id number] Arrival date & time: 11/16/23  9147     History  Chief Complaint  Patient presents with   Back Pain    Carol [name] is a 58 y.o. female.  HPI 58 year old female presents with right lower back pain.  Symptoms started yesterday.  2 days ago she lifted up a 50 pound dog bag and thinks that might of caused this.  However she did not remember any specific injury or any direct trauma.  The pain is in her right lower back and does not radiate.  However moving any part of the right side of her body induces pain.  No abdominal pain, urinary symptoms, incontinence, or weakness/numbness in the legs.  Home Medications Prior to Admission medications   Medication Sig Start Date End Date Taking? Authorizing Provider  cyclobenzaprine (FLEXERIL) 10 MG tablet Take 1 tablet (10 mg total) by mouth 2 (two) times daily as needed for muscle spasms. 11/16/23  Yes Pricilla Loveless, MD  lidocaine (LIDODERM) 5 % Place 1 patch onto the skin daily. Remove & Discard patch within 12 hours or as directed by MD 11/16/23  Yes Pricilla Loveless, MD  amLODipine (NORVASC) 5 MG tablet Take 5 mg by mouth every morning. 05/15/23   [provider]  nicotine (NICODERM CQ - DOSED IN MG/24 HOURS) 14 mg/24hr patch Place 14 mg onto the skin daily.    [provider]      Allergies    Naftin [naftifine hcl] and Latex    Review of Systems   Review of Systems  Gastrointestinal:  Negative for abdominal pain.  Genitourinary:  Negative for dysuria.  Musculoskeletal:  Positive for back pain.  Neurological:  Negative for weakness and numbness.    Physical Exam Updated Vital Signs BP (!) 151/74 (BP Location: Right Arm)   Pulse 83   Temp 98 F (36.7 C) (Temporal)   Resp 18   Ht 5' 7.5" (1.715 m)   Wt 67.5 kg   SpO2 100%   BMI 22.96 kg/m  Physical Exam Vitals and nursing note reviewed.  Constitutional:       Appearance: She is well-developed.  HENT:     Head: Normocephalic and atraumatic.  Cardiovascular:     Rate and Rhythm: Normal rate and regular rhythm.     Pulses:          Posterior tibial pulses are 2+ on the right side.  Pulmonary:     Effort: Pulmonary effort is normal.  Abdominal:     General: There is no distension.     Palpations: Abdomen is soft.     Tenderness: There is no abdominal tenderness.  Musculoskeletal:     Lumbar back: Tenderness present. No bony tenderness.       Back:     Right hip: No tenderness. Normal range of motion.  Skin:    General: Skin is warm and dry.  Neurological:     Mental Status: She is alert.     Comments: 5/5 strength in BLE. Grossly normal sensation.     ED Results / Procedures / Treatments   Labs (all labs ordered are listed, but only abnormal results are displayed) Labs Reviewed - No data to display  EKG None  Radiology No results found.  Procedures Procedures    Medications Ordered in ED Medications  ketorolac (TORADOL) 30 MG/ML injection 30 mg (has no administration in time range)  lidocaine (LIDODERM) 5 %  1 patch (has no administration in time range)  acetaminophen (TYLENOL) tablet 650 mg (has no administration in time range)    ED Course/ Medical Decision Making/ A&P                                 Medical Decision Making Risk OTC drugs. Prescription drug management.   Patient has point tenderness to the right lower back.  No bony tenderness.  No hip pain or decreased range of motion.  Likely muscle strain versus spasm.  Highly doubt vascular emergency/retroperitoneal emergency.  Highly doubt spinal cord emergency.  No new weight loss or other red flags.  Seems reproducible and induced by certain movements.  Will treat with supportive care such as a Toradol shot now, Flexeril prescription and Lidoderm prescription and recommend OTC meds.  Given return precautions.        Final Clinical Impression(s) / ED  Diagnoses Final diagnoses:  Strain of lumbar region, initial encounter    Rx / DC Orders ED Discharge Orders          Ordered    lidocaine (LIDODERM) 5 %  Every 24 hours        11/16/23 1040    cyclobenzaprine (FLEXERIL) 10 MG tablet  2 times daily PRN        11/16/23 1040              Pricilla Loveless, MD 11/16/23 1102

## 2023-11-16 NOTE — ED Triage Notes (Signed)
 Pt arrived via POV c/o right lower back pain that radiates down her right leg. Pt reports doing heavy lifting this past weekend, and may have pulled something. Pt reports she will also require a work note.

## 2023-12-07 ENCOUNTER — Encounter (HOSPITAL_COMMUNITY): Payer: Self-pay | Admitting: *Deleted

## 2023-12-07 ENCOUNTER — Other Ambulatory Visit: Payer: Self-pay

## 2023-12-07 ENCOUNTER — Emergency Department (HOSPITAL_COMMUNITY): Admission: EM | Admit: 2023-12-07 | Discharge: 2023-12-07 | Disposition: A | Discharge status: Home / Self Care

## 2023-12-07 DIAGNOSIS — T63461A Toxic effect of venom of wasps, accidental (unintentional), initial encounter: Secondary | ICD-10-CM | POA: Diagnosis not present

## 2023-12-07 DIAGNOSIS — Z79899 Other long term (current) drug therapy: Secondary | ICD-10-CM | POA: Diagnosis not present

## 2023-12-07 DIAGNOSIS — Z91040 Latex allergy status: Secondary | ICD-10-CM | POA: Diagnosis not present

## 2023-12-07 DIAGNOSIS — M79604 Pain in right leg: Secondary | ICD-10-CM | POA: Diagnosis present

## 2023-12-07 DIAGNOSIS — I10 Essential (primary) hypertension: Secondary | ICD-10-CM | POA: Insufficient documentation

## 2023-12-07 DIAGNOSIS — E119 Type 2 diabetes mellitus without complications: Secondary | ICD-10-CM | POA: Diagnosis not present

## 2023-12-07 DIAGNOSIS — L03115 Cellulitis of right lower limb: Secondary | ICD-10-CM | POA: Insufficient documentation

## 2023-12-07 MED ORDER — CEPHALEXIN 500 MG PO CAPS: 1000.0000 mg | ORAL_CAPSULE | Freq: Two times a day (BID) | ORAL | 0 refills | Status: AC

## 2023-12-07 NOTE — ED Triage Notes (Signed)
 Pt c/o yellow jacket sting to the back of her right thigh that happened Saturday. Pt reports the area is continuously getting worse as far as pain and itching.

## 2023-12-07 NOTE — ED Provider Notes (Signed)
 [place] EMERGENCY DEPARTMENT AT [facility] Provider Note   CSN: [id number] Arrival date & time: 12/07/23  1547     History  Chief Complaint  Patient presents with   Insect Bite    Carol [name] is a 58 y.o. female with history of diabetes and hypertension who presents to the emergency department with leg pain after yellowjacket sting.  Patient was stung several x 2 days ago.  She has had worsening pain, as well as some swelling around one of the areas of the bite.  She has been using ice and rubbed tobacco on it.  HPI     Home Medications Prior to Admission medications   Medication Sig Start Date End Date Taking? Authorizing Provider  cephALEXin  (KEFLEX ) 500 MG capsule Take 2 capsules (1,000 mg total) by mouth 2 (two) times daily for 5 days. 12/07/23 12/12/23 Yes Der Gagliano T, PA-C  amLODipine (NORVASC) 5 MG tablet Take 5 mg by mouth every morning. 05/15/23   [provider]  cyclobenzaprine  (FLEXERIL ) 10 MG tablet Take 1 tablet (10 mg total) by mouth 2 (two) times daily as needed for muscle spasms. 11/16/23   Jerilynn Montenegro, MD  lidocaine  (LIDODERM ) 5 % Place 1 patch onto the skin daily. Remove & Discard patch within 12 hours or as directed by MD 11/16/23   Jerilynn Montenegro, MD  nicotine (NICODERM CQ - DOSED IN MG/24 HOURS) 14 mg/24hr patch Place 14 mg onto the skin daily.    [provider]      Allergies    Naftin [naftifine hcl] and Latex    Review of Systems   Review of Systems  Skin:  Positive for wound.  All other systems reviewed and are negative.   Physical Exam Updated Vital Signs BP (!) 149/87 (BP Location: Right Arm)   Pulse 62   Temp 97.8 F (36.6 C) (Oral)   Resp 16   Ht 5\' 7"  (1.702 m)   Wt 61.2 kg   SpO2 100%   BMI 21.14 kg/m  Physical Exam Vitals and nursing note reviewed.  Constitutional:      Appearance: Normal appearance.  HENT:     Head: Normocephalic and atraumatic.  Eyes:     Conjunctiva/sclera:  Conjunctivae normal.  Pulmonary:     Effort: Pulmonary effort is normal. No respiratory distress.  Skin:    General: Skin is warm and dry.          Comments: 2 insect bites to the right posterior thigh, one with about 3 cm of erythema and swelling  Neurological:     Mental Status: She is alert.  Psychiatric:        Mood and Affect: Mood normal.        Behavior: Behavior normal.     ED Results / Procedures / Treatments   Labs (all labs ordered are listed, but only abnormal results are displayed) Labs Reviewed - No data to display  EKG None  Radiology No results found.  Procedures Procedures    Medications Ordered in ED Medications - No data to display  ED Course/ Medical Decision Making/ A&P                                 Medical Decision Making Risk Prescription drug management.  This patient is a 58 y.o. female who presents to the ED for concern of insect bite.   Past Medical History / Social  History / Additional history: Chart reviewed. Pertinent results include: Migraine headaches, tobacco use, hypertension, diabetes  Physical Exam: Physical exam performed. The pertinent findings include: 2 areas of erythema to the posterior right thigh consistent with insect bites, 1 of which has about 3 cm surrounding area of erythema and very mild induration without fluctuance  Disposition: After consideration of the diagnostic results and the patients response to treatment, I feel that emergency department workup does not suggest an emergent condition requiring admission or immediate intervention beyond what has been performed at this time. The plan is: Discharge home.  Suspect possible overlying cellulitis to insect bite.  Will prescribe course of Keflex , recommend ice for swelling and over-the-counter medications for pain. The patient is safe for discharge and has been instructed to return immediately for worsening symptoms, change in symptoms or any other  concerns.  Final Clinical Impression(s) / ED Diagnoses Final diagnoses:  Yellow jacket sting, accidental or unintentional, initial encounter  Cellulitis of right lower extremity    Rx / DC Orders ED Discharge Orders          Ordered    cephALEXin  (KEFLEX ) 500 MG capsule  2 times daily        12/07/23 1628           Portions of this report may have been transcribed using voice recognition software. Every effort was made to ensure accuracy; however, inadvertent computerized transcription errors may be present.    Fauna Neuner T, PA-C 12/07/23 1638    Carin Charleston, MD 12/07/23 [phone]

## 2023-12-07 NOTE — Discharge Instructions (Addendum)
 You were seen in the emergency department after a yellowjacket sting.  I think that you might have developed some overlying skin infection.  I am placing on a course of some antibiotics.  You can take ibuprofen  and social Tylenol  as needed for pain.  You can continue use ice or cold compresses for swelling.

## 2024-03-14 ENCOUNTER — Emergency Department (HOSPITAL_COMMUNITY)
Admission: EM | Admit: 2024-03-14 | Discharge: 2024-03-14 | Disposition: A | Discharge status: Home / Self Care | Attending: Emergency Medicine | Admitting: Emergency Medicine

## 2024-03-14 ENCOUNTER — Other Ambulatory Visit: Payer: Self-pay

## 2024-03-14 ENCOUNTER — Encounter (HOSPITAL_COMMUNITY): Payer: Self-pay | Admitting: *Deleted

## 2024-03-14 DIAGNOSIS — Z79899 Other long term (current) drug therapy: Secondary | ICD-10-CM | POA: Insufficient documentation

## 2024-03-14 DIAGNOSIS — J111 Influenza due to unidentified influenza virus with other respiratory manifestations: Secondary | ICD-10-CM

## 2024-03-14 DIAGNOSIS — Z91040 Latex allergy status: Secondary | ICD-10-CM | POA: Insufficient documentation

## 2024-03-14 DIAGNOSIS — I10 Essential (primary) hypertension: Secondary | ICD-10-CM | POA: Insufficient documentation

## 2024-03-14 DIAGNOSIS — R059 Cough, unspecified: Secondary | ICD-10-CM | POA: Diagnosis present

## 2024-03-14 DIAGNOSIS — R110 Nausea: Secondary | ICD-10-CM | POA: Insufficient documentation

## 2024-03-14 DIAGNOSIS — E119 Type 2 diabetes mellitus without complications: Secondary | ICD-10-CM | POA: Diagnosis not present

## 2024-03-14 DIAGNOSIS — M791 Myalgia, unspecified site: Secondary | ICD-10-CM | POA: Diagnosis not present

## 2024-03-14 DIAGNOSIS — F17210 Nicotine dependence, cigarettes, uncomplicated: Secondary | ICD-10-CM | POA: Diagnosis not present

## 2024-03-14 LAB — CBC WITH DIFFERENTIAL/PLATELET
Abs Immature Granulocytes: 0.01 K/uL (ref 0.00–0.07)
Basophils Absolute: 0 K/uL (ref 0.0–0.1)
Basophils Relative: 1 %
Eosinophils Absolute: 0.2 K/uL (ref 0.0–0.5)
Eosinophils Relative: 2 %
HCT: 36.8 % (ref 36.0–46.0)
Hemoglobin: 12.8 g/dL (ref 12.0–15.0)
Immature Granulocytes: 0 %
Lymphocytes Relative: 35 %
Lymphs Abs: 3 K/uL (ref 0.7–4.0)
MCH: 29.6 pg (ref 26.0–34.0)
MCHC: 34.8 g/dL (ref 30.0–36.0)
MCV: 85.2 fL (ref 80.0–100.0)
Monocytes Absolute: 0.5 K/uL (ref 0.1–1.0)
Monocytes Relative: 6 %
Neutro Abs: 4.9 K/uL (ref 1.7–7.7)
Neutrophils Relative %: 56 %
Platelets: 352 K/uL (ref 150–400)
RBC: 4.32 MIL/uL (ref 3.87–5.11)
RDW: 12.6 % (ref 11.5–15.5)
WBC: 8.7 K/uL (ref 4.0–10.5)
nRBC: 0 % (ref 0.0–0.2)

## 2024-03-14 LAB — RESP PANEL BY RT-PCR (RSV, FLU A&B, COVID)  RVPGX2
Influenza A by PCR: NEGATIVE
Influenza B by PCR: NEGATIVE
Resp Syncytial Virus by PCR: NEGATIVE
SARS Coronavirus 2 by RT PCR: NEGATIVE

## 2024-03-14 LAB — BASIC METABOLIC PANEL WITH GFR
Anion gap: 10 (ref 5–15)
BUN: 11 mg/dL (ref 6–20)
CO2: 27 mmol/L (ref 22–32)
Calcium: 8.9 mg/dL (ref 8.9–10.3)
Chloride: 101 mmol/L (ref 98–111)
Creatinine, Ser: 0.79 mg/dL (ref 0.44–1.00)
GFR, Estimated: >60 mL/min (ref >60)
Glucose, Bld: 207 mg/dL — ABNORMAL HIGH (ref 70–99)
Potassium: 3.9 mmol/L (ref 3.5–5.1)
Sodium: 138 mmol/L (ref 135–145)

## 2024-03-14 LAB — CBG MONITORING, ED: Glucose-Capillary: 225 mg/dL — ABNORMAL HIGH (ref 70–99)

## 2024-03-14 LAB — CK: Total CK: 119 U/L (ref 38–234)

## 2024-03-14 MED ORDER — KETOROLAC TROMETHAMINE 15 MG/ML IJ SOLN: 15.0000 mg | Freq: Once | INTRAMUSCULAR | Status: DC

## 2024-03-14 MED ORDER — KETOROLAC TROMETHAMINE 15 MG/ML IJ SOLN
15.0000 mg | Freq: Once | INTRAMUSCULAR | Status: AC
  Administered 2024-03-14: 15 mg via INTRAMUSCULAR
  Filled 2024-03-14: qty 1

## 2024-03-14 NOTE — ED Provider Notes (Signed)
 [place] EMERGENCY DEPARTMENT AT [facility] Provider Note   CSN: [id number] Arrival date & time: 03/14/24  1426     Patient presents with: Generalized Body Aches   Carol [name] is a 58 y.o. female history of diabetes, hypertension presents with complaints myalgias, cough that started earlier this morning.  States she feels like she has a flu.  Did feel little nauseous without vomiting.  No abdominal pain.  No sick contacts at home.   HPI    Past Medical History:  Diagnosis Date   Back pain    Bronchitis    Cervicalgia    Diabetes (HCC)    History of blood in urine    microscopic   History of migraine headaches    Hypertension    Impaired fasting glucose    Smoker    1ppd     Prior to Admission medications   Medication Sig Start Date End Date Taking? Authorizing Provider  amLODipine (NORVASC) 5 MG tablet Take 5 mg by mouth every morning. 05/15/23   [provider]  cyclobenzaprine  (FLEXERIL ) 10 MG tablet Take 1 tablet (10 mg total) by mouth 2 (two) times daily as needed for muscle spasms. 11/16/23   Freddi Hamilton, MD  lidocaine  (LIDODERM ) 5 % Place 1 patch onto the skin daily. Remove & Discard patch within 12 hours or as directed by MD 11/16/23   Freddi Hamilton, MD  nicotine (NICODERM CQ - DOSED IN MG/24 HOURS) 14 mg/24hr patch Place 14 mg onto the skin daily.    [provider]    Allergies: Naftin [naftifine hcl] and Latex    Review of Systems  Musculoskeletal:  Positive for myalgias.    Updated Vital Signs BP (!) 148/88 (BP Location: Right Arm)   Pulse 89   Temp 98.4 F (36.9 C) (Oral)   Resp 18   Ht 5' 7 (1.702 m)   Wt 74.8 kg   SpO2 100%   BMI 25.84 kg/m   Physical Exam Vitals and nursing note reviewed.  Constitutional:      General: She is not in acute distress.    Appearance: She is well-developed.  HENT:     Head: Normocephalic and atraumatic.  Eyes:     Conjunctiva/sclera: Conjunctivae normal.   Cardiovascular:     Rate and Rhythm: Normal rate and regular rhythm.     Heart sounds: No murmur heard. Pulmonary:     Effort: Pulmonary effort is normal. No respiratory distress.     Breath sounds: Normal breath sounds.  Abdominal:     Palpations: Abdomen is soft.     Tenderness: There is no abdominal tenderness.  Musculoskeletal:        General: No swelling.     Cervical back: Neck supple.  Skin:    General: Skin is warm and dry.     Capillary Refill: Capillary refill takes less than 2 seconds.  Neurological:     Mental Status: She is alert.  Psychiatric:        Mood and Affect: Mood normal.     (all labs ordered are listed, but only abnormal results are displayed) Labs Reviewed  BASIC METABOLIC PANEL WITH GFR - Abnormal; Notable for the following components:      Result Value   Glucose, Bld 207 (*)    All other components within normal limits  CBG MONITORING, ED - Abnormal; Notable for the following components:   Glucose-Capillary 225 (*)    All other components within normal limits  RESP PANEL BY RT-PCR (RSV, FLU A&B, COVID)  RVPGX2  CBC WITH DIFFERENTIAL/PLATELET  CK    EKG: None  Radiology: No results found.   Procedures   Medications Ordered in the ED  ketorolac  (TORADOL ) 15 MG/ML injection 15 mg (has no administration in time range)                                    Medical Decision Making Amount and/or Complexity of Data Reviewed Labs: ordered.   This patient presents to the ED with chief complaint(s) of myalgias .  The complaint involves an extensive differential diagnosis and also carries with it a high risk of complications and morbidity.   Pertinent past medical history as listed in HPI  The differential diagnosis includes  URI, pneumonia, rhabdo, sepsis Additional history obtained: Records reviewed Care Everywhere/External Records  Assessment and management:   Hemodynamically stable.  Patient presenting with complaints of myalgias,  cough and nausea that started earlier this morning.  Her lungs are clear and patient is afebrile.  Do not suspect pneumonia at this time.  Her abdomen is nontender she is without any vomiting.  Routine labs were drawn in triage.  Notably she is without any leukocytosis.  Will add on a respiratory panel and a CK while awaiting this.  Overall workup is reassuring.  Clinical presentation is most consistent with viral URI.  Recommend supportive care.  Independent ECG interpretation:  none  Independent labs interpretation:  The following labs were independently interpreted:  CBC unremarkable, BMP without significant abnormality, CK within normal limits, respiratory panel negative  Independent visualization and interpretation of imaging: I independently visualized the following imaging with scope of interpretation limited to determining acute life threatening conditions related to emergency care: none    Consultations obtained:   none  Disposition:   Patient will be discharged home. The patient has been appropriately medically screened and/or stabilized in the ED. I have low suspicion for any other emergent medical condition which would require further screening, evaluation or treatment in the ED or require inpatient management. At time of discharge the patient is hemodynamically stable and in no acute distress. I have discussed work-up results and diagnosis with patient and answered all questions. Patient is agreeable with discharge plan. We discussed strict return precautions for returning to the emergency department and they verbalized understanding.     Social Determinants of Health:   None  This note was dictated with voice recognition software.  Despite best efforts at proofreading, errors may have occurred which can change the documentation meaning.       Final diagnoses:  Influenza-like illness    ED Discharge Orders     None          Donnajean Lynwood VEAR DEVONNA 03/14/24  1710    Melvenia Motto, MD 03/14/24 [phone]

## 2024-03-14 NOTE — Discharge Instructions (Signed)
 Your lab work did not show any significant abnormality.  You may use Tylenol  1000 mg and/or Motrin  600 mg every 4-6 hours up to 3 times a day for fever or body aches.  Please keep in mind that this dosing is not meant to be continued long-term and that many over-the-counter cough and flu medications contain acetaminophen  or ibuprofen . You can expect your current symptoms to linger over the next week or two but please return to the emergency room if you experience any new or worsening symptoms including persistent fevers, worsening productive cough and persistent vomiting. Please follow-up with your primary care provider regarding your ER visit.

## 2024-03-14 NOTE — ED Triage Notes (Signed)
 Pt with body aches since yesterday. Unknown of fevers, states she got off work with body aches and feeling hot.  Nausea this morning, denies emesis. + generalized weakness
